# Patient Record
Sex: Male | Born: 1970 | Race: White | Hispanic: No | Marital: Married | Smoking: Former smoker
Health system: Southern US, Community
[De-identification: ages and names within clinical notes are randomized; demographics above are authoritative.]

## PROBLEM LIST (undated history)

## (undated) ENCOUNTER — Ambulatory Visit: Admission: EM | Payer: 59 | Source: Home / Self Care

## (undated) DIAGNOSIS — M1990 Unspecified osteoarthritis, unspecified site: Secondary | ICD-10-CM

## (undated) DIAGNOSIS — J45909 Unspecified asthma, uncomplicated: Secondary | ICD-10-CM

## (undated) DIAGNOSIS — F419 Anxiety disorder, unspecified: Secondary | ICD-10-CM

## (undated) DIAGNOSIS — Z87442 Personal history of urinary calculi: Secondary | ICD-10-CM

## (undated) DIAGNOSIS — I10 Essential (primary) hypertension: Secondary | ICD-10-CM

## (undated) DIAGNOSIS — T7840XA Allergy, unspecified, initial encounter: Secondary | ICD-10-CM

## (undated) DIAGNOSIS — R7989 Other specified abnormal findings of blood chemistry: Secondary | ICD-10-CM

## (undated) DIAGNOSIS — T4145XA Adverse effect of unspecified anesthetic, initial encounter: Secondary | ICD-10-CM

## (undated) DIAGNOSIS — Z8489 Family history of other specified conditions: Secondary | ICD-10-CM

## (undated) DIAGNOSIS — G43909 Migraine, unspecified, not intractable, without status migrainosus: Secondary | ICD-10-CM

## (undated) DIAGNOSIS — K219 Gastro-esophageal reflux disease without esophagitis: Secondary | ICD-10-CM

## (undated) DIAGNOSIS — T8859XA Other complications of anesthesia, initial encounter: Secondary | ICD-10-CM

## (undated) DIAGNOSIS — B019 Varicella without complication: Secondary | ICD-10-CM

## (undated) DIAGNOSIS — E785 Hyperlipidemia, unspecified: Secondary | ICD-10-CM

## (undated) DIAGNOSIS — U071 COVID-19: Secondary | ICD-10-CM

## (undated) DIAGNOSIS — E119 Type 2 diabetes mellitus without complications: Secondary | ICD-10-CM

## (undated) HISTORY — DX: Allergy, unspecified, initial encounter: T78.40XA

## (undated) HISTORY — DX: Type 2 diabetes mellitus without complications: E11.9

## (undated) HISTORY — DX: Unspecified asthma, uncomplicated: J45.909

## (undated) HISTORY — DX: Gastro-esophageal reflux disease without esophagitis: K21.9

## (undated) HISTORY — DX: Varicella without complication: B01.9

## (undated) HISTORY — DX: Unspecified osteoarthritis, unspecified site: M19.90

## (undated) HISTORY — DX: Hyperlipidemia, unspecified: E78.5

## (undated) HISTORY — DX: Migraine, unspecified, not intractable, without status migrainosus: G43.909

## (undated) HISTORY — DX: Anxiety disorder, unspecified: F41.9

## (undated) HISTORY — DX: COVID-19: U07.1

---

## 2001-11-09 HISTORY — PX: OSTECTOMY METATARSAL: SUR970

## 2004-09-21 ENCOUNTER — Emergency Department: Payer: Self-pay | Admitting: Emergency Medicine

## 2004-11-09 ENCOUNTER — Emergency Department: Payer: Self-pay | Admitting: Emergency Medicine

## 2005-04-26 ENCOUNTER — Emergency Department: Payer: Self-pay | Admitting: Emergency Medicine

## 2006-05-27 ENCOUNTER — Emergency Department: Payer: Self-pay | Admitting: Emergency Medicine

## 2006-06-14 ENCOUNTER — Other Ambulatory Visit: Payer: Self-pay

## 2006-06-14 ENCOUNTER — Emergency Department: Payer: Self-pay | Admitting: General Practice

## 2006-09-28 ENCOUNTER — Emergency Department: Payer: Self-pay | Admitting: Internal Medicine

## 2008-03-19 ENCOUNTER — Other Ambulatory Visit: Payer: Self-pay

## 2008-03-19 ENCOUNTER — Observation Stay: Payer: Self-pay | Admitting: Internal Medicine

## 2009-05-16 ENCOUNTER — Emergency Department: Payer: Self-pay | Admitting: Emergency Medicine

## 2009-11-05 ENCOUNTER — Emergency Department: Payer: Self-pay | Admitting: Emergency Medicine

## 2009-12-04 ENCOUNTER — Emergency Department: Payer: Self-pay | Admitting: Emergency Medicine

## 2009-12-11 ENCOUNTER — Emergency Department: Payer: Self-pay | Admitting: Emergency Medicine

## 2010-07-22 ENCOUNTER — Inpatient Hospital Stay (HOSPITAL_COMMUNITY): Admission: EM | Admit: 2010-07-22 | Discharge: 2010-07-23 | Payer: Self-pay | Admitting: Emergency Medicine

## 2010-07-22 ENCOUNTER — Ambulatory Visit: Payer: Self-pay | Admitting: Cardiology

## 2010-07-23 ENCOUNTER — Encounter (INDEPENDENT_AMBULATORY_CARE_PROVIDER_SITE_OTHER): Payer: Self-pay | Admitting: Internal Medicine

## 2011-01-22 LAB — DIFFERENTIAL
Basophils Absolute: 0 10*3/uL (ref 0.0–0.1)
Basophils Relative: 1 % (ref 0–1)
Eosinophils Relative: 2 % (ref 0–5)
Monocytes Absolute: 0.7 10*3/uL (ref 0.1–1.0)
Monocytes Relative: 9 % (ref 3–12)
Neutro Abs: 4.2 10*3/uL (ref 1.7–7.7)

## 2011-01-22 LAB — TROPONIN I
Troponin I: 0.01 ng/mL (ref 0.00–0.06)
Troponin I: <0.01 ng/mL (ref 0.00–0.06)

## 2011-01-22 LAB — CBC
MCH: 30.5 pg (ref 26.0–34.0)
MCHC: 33.8 g/dL (ref 30.0–36.0)
MCV: 89.3 fL (ref 78.0–100.0)
Platelets: 158 10*3/uL (ref 150–400)
Platelets: 165 10*3/uL (ref 150–400)
RBC: 4.43 MIL/uL (ref 4.22–5.81)
RBC: 4.76 MIL/uL (ref 4.22–5.81)
RDW: 12.4 % (ref 11.5–15.5)
WBC: 7.9 10*3/uL (ref 4.0–10.5)

## 2011-01-22 LAB — COMPREHENSIVE METABOLIC PANEL
ALT: 15 U/L (ref 0–53)
AST: 17 U/L (ref 0–37)
Albumin: 3.6 g/dL (ref 3.5–5.2)
Alkaline Phosphatase: 53 U/L (ref 39–117)
CO2: 25 mEq/L (ref 19–32)
Calcium: 8.7 mg/dL (ref 8.4–10.5)
Chloride: 109 mEq/L (ref 96–112)
Creatinine, Ser: 1.19 mg/dL (ref 0.4–1.5)
GFR calc Af Amer: >60 mL/min (ref >60)
GFR calc non Af Amer: >60 mL/min (ref >60)
Glucose, Bld: 101 mg/dL — ABNORMAL HIGH (ref 70–99)
Potassium: 4.2 mEq/L (ref 3.5–5.1)
Sodium: 139 mEq/L (ref 135–145)
Total Bilirubin: 0.5 mg/dL (ref 0.3–1.2)
Total Protein: 6.3 g/dL (ref 6.0–8.3)
Total Protein: 7.2 g/dL (ref 6.0–8.3)

## 2011-01-22 LAB — POCT CARDIAC MARKERS
CKMB, poc: <1 ng/mL — ABNORMAL LOW (ref 1.0–8.0)
CKMB, poc: <1 ng/mL — ABNORMAL LOW (ref 1.0–8.0)
Troponin i, poc: <0.05 ng/mL (ref 0.00–0.09)
Troponin i, poc: <0.05 ng/mL (ref 0.00–0.09)

## 2011-01-22 LAB — RAPID URINE DRUG SCREEN, HOSP PERFORMED
Cocaine: NOT DETECTED
Tetrahydrocannabinol: NOT DETECTED

## 2011-01-22 LAB — CK TOTAL AND CKMB (NOT AT ARMC)
CK, MB: 0.6 ng/mL (ref 0.3–4.0)
Relative Index: INVALID (ref 0.0–2.5)
Relative Index: INVALID (ref 0.0–2.5)
Total CK: 76 U/L (ref 7–232)
Total CK: 86 U/L (ref 7–232)
Total CK: 88 U/L (ref 7–232)

## 2011-01-22 LAB — ETHANOL: Alcohol, Ethyl (B): <5 mg/dL (ref 0–10)

## 2011-01-22 LAB — TSH: TSH: 1.398 u[IU]/mL (ref 0.350–4.500)

## 2011-01-22 LAB — MAGNESIUM: Magnesium: 2 mg/dL (ref 1.5–2.5)

## 2013-12-31 LAB — BASIC METABOLIC PANEL
Anion Gap: 6 — ABNORMAL LOW (ref 7–16)
BUN: 15 mg/dL (ref 7–18)
CALCIUM: 8.3 mg/dL — AB (ref 8.5–10.1)
CHLORIDE: 107 mmol/L (ref 98–107)
Co2: 25 mmol/L (ref 21–32)
Creatinine: 1.65 mg/dL — ABNORMAL HIGH (ref 0.60–1.30)
EGFR (African American): 59 — ABNORMAL LOW
EGFR (Non-African Amer.): 51 — ABNORMAL LOW
GLUCOSE: 124 mg/dL — AB (ref 65–99)
OSMOLALITY: 278 (ref 275–301)
Potassium: 3.7 mmol/L (ref 3.5–5.1)
SODIUM: 138 mmol/L (ref 136–145)

## 2013-12-31 LAB — CBC
HCT: 41.3 % (ref 40.0–52.0)
HGB: 13.6 g/dL (ref 13.0–18.0)
MCH: 29.9 pg (ref 26.0–34.0)
MCHC: 32.9 g/dL (ref 32.0–36.0)
MCV: 91 fL (ref 80–100)
PLATELETS: 158 10*3/uL (ref 150–440)
RBC: 4.54 10*6/uL (ref 4.40–5.90)
RDW: 13.1 % (ref 11.5–14.5)
WBC: 9.7 10*3/uL (ref 3.8–10.6)

## 2013-12-31 LAB — TROPONIN I: Troponin-I: <0.02 ng/mL

## 2013-12-31 LAB — CK TOTAL AND CKMB (NOT AT ARMC)
CK, TOTAL: 223 U/L
CK-MB: 0.8 ng/mL (ref 0.5–3.6)

## 2013-12-31 LAB — PRO B NATRIURETIC PEPTIDE: B-Type Natriuretic Peptide: 35 pg/mL (ref 0–125)

## 2014-01-01 ENCOUNTER — Observation Stay: Payer: Self-pay | Admitting: Internal Medicine

## 2014-01-01 DIAGNOSIS — R079 Chest pain, unspecified: Secondary | ICD-10-CM

## 2014-01-01 DIAGNOSIS — I10 Essential (primary) hypertension: Secondary | ICD-10-CM

## 2014-01-01 DIAGNOSIS — I517 Cardiomegaly: Secondary | ICD-10-CM

## 2014-01-01 DIAGNOSIS — R55 Syncope and collapse: Secondary | ICD-10-CM

## 2014-01-01 LAB — URINALYSIS, COMPLETE
BILIRUBIN, UR: NEGATIVE
BLOOD: NEGATIVE
Bacteria: NONE SEEN
Glucose,UR: 50 mg/dL (ref 0–75)
KETONE: NEGATIVE
Leukocyte Esterase: NEGATIVE
Nitrite: NEGATIVE
Ph: 5 (ref 4.5–8.0)
Protein: NEGATIVE
RBC,UR: <1 /HPF (ref 0–5)
Squamous Epithelial: <1
WBC UR: 1 /HPF (ref 0–5)

## 2014-01-01 LAB — RAPID INFLUENZA A&B ANTIGENS (ARMC ONLY)

## 2014-01-01 LAB — CK TOTAL AND CKMB (NOT AT ARMC)
CK, Total: 144 U/L
CK-MB: <0.5 ng/mL — ABNORMAL LOW (ref 0.5–3.6)

## 2014-01-01 LAB — TROPONIN I

## 2014-01-01 LAB — CK-MB: CK-MB: <0.5 ng/mL — ABNORMAL LOW (ref 0.5–3.6)

## 2014-01-01 LAB — CREATININE, SERUM
Creatinine: 1.35 mg/dL — ABNORMAL HIGH (ref 0.60–1.30)
EGFR (African American): >60

## 2014-01-02 LAB — CBC WITH DIFFERENTIAL/PLATELET
BASOS ABS: 0 10*3/uL (ref 0.0–0.1)
Basophil %: 0.5 %
EOS ABS: 0 10*3/uL (ref 0.0–0.7)
Eosinophil %: 0.7 %
HCT: 39.8 % — ABNORMAL LOW (ref 40.0–52.0)
HGB: 13.5 g/dL (ref 13.0–18.0)
Lymphocyte #: 2.6 10*3/uL (ref 1.0–3.6)
Lymphocyte %: 40.3 %
MCH: 30.7 pg (ref 26.0–34.0)
MCHC: 33.8 g/dL (ref 32.0–36.0)
MCV: 91 fL (ref 80–100)
Monocyte #: 0.6 x10 3/mm (ref 0.2–1.0)
Monocyte %: 8.7 %
NEUTROS ABS: 3.3 10*3/uL (ref 1.4–6.5)
NEUTROS PCT: 49.8 %
Platelet: 87 10*3/uL — ABNORMAL LOW (ref 150–440)
RBC: 4.38 10*6/uL — AB (ref 4.40–5.90)
RDW: 13.3 % (ref 11.5–14.5)
WBC: 6.6 10*3/uL (ref 3.8–10.6)

## 2014-01-02 LAB — LIPID PANEL
Cholesterol: 119 mg/dL (ref 0–200)
HDL Cholesterol: 25 mg/dL — ABNORMAL LOW (ref 40–60)
Ldl Cholesterol, Calc: 47 mg/dL (ref 0–100)
Triglycerides: 234 mg/dL — ABNORMAL HIGH (ref 0–200)
VLDL CHOLESTEROL, CALC: 47 mg/dL — AB (ref 5–40)

## 2014-01-02 LAB — BASIC METABOLIC PANEL
Anion Gap: 5 — ABNORMAL LOW (ref 7–16)
BUN: 13 mg/dL (ref 7–18)
CHLORIDE: 104 mmol/L (ref 98–107)
CO2: 29 mmol/L (ref 21–32)
Calcium, Total: 8.1 mg/dL — ABNORMAL LOW (ref 8.5–10.1)
Creatinine: 1.37 mg/dL — ABNORMAL HIGH (ref 0.60–1.30)
EGFR (African American): >60
Glucose: 112 mg/dL — ABNORMAL HIGH (ref 65–99)
Osmolality: 277 (ref 275–301)
POTASSIUM: 4.5 mmol/L (ref 3.5–5.1)
SODIUM: 138 mmol/L (ref 136–145)

## 2014-10-31 DIAGNOSIS — I160 Hypertensive urgency: Secondary | ICD-10-CM | POA: Insufficient documentation

## 2014-12-09 ENCOUNTER — Emergency Department (HOSPITAL_COMMUNITY): Payer: Self-pay

## 2014-12-09 ENCOUNTER — Emergency Department (HOSPITAL_COMMUNITY)
Admission: EM | Admit: 2014-12-09 | Discharge: 2014-12-09 | Disposition: A | Discharge status: Home / Self Care | Payer: Self-pay | Attending: Emergency Medicine | Admitting: Emergency Medicine

## 2014-12-09 ENCOUNTER — Encounter (HOSPITAL_COMMUNITY): Payer: Self-pay | Admitting: Emergency Medicine

## 2014-12-09 DIAGNOSIS — Y9289 Other specified places as the place of occurrence of the external cause: Secondary | ICD-10-CM | POA: Insufficient documentation

## 2014-12-09 DIAGNOSIS — Y9389 Activity, other specified: Secondary | ICD-10-CM | POA: Insufficient documentation

## 2014-12-09 DIAGNOSIS — I10 Essential (primary) hypertension: Secondary | ICD-10-CM | POA: Insufficient documentation

## 2014-12-09 DIAGNOSIS — Z8639 Personal history of other endocrine, nutritional and metabolic disease: Secondary | ICD-10-CM | POA: Insufficient documentation

## 2014-12-09 DIAGNOSIS — Z79899 Other long term (current) drug therapy: Secondary | ICD-10-CM | POA: Insufficient documentation

## 2014-12-09 DIAGNOSIS — S93402A Sprain of unspecified ligament of left ankle, initial encounter: Secondary | ICD-10-CM | POA: Insufficient documentation

## 2014-12-09 DIAGNOSIS — Z880 Allergy status to penicillin: Secondary | ICD-10-CM | POA: Insufficient documentation

## 2014-12-09 DIAGNOSIS — Y998 Other external cause status: Secondary | ICD-10-CM | POA: Insufficient documentation

## 2014-12-09 DIAGNOSIS — X58XXXA Exposure to other specified factors, initial encounter: Secondary | ICD-10-CM | POA: Insufficient documentation

## 2014-12-09 HISTORY — DX: Other specified abnormal findings of blood chemistry: R79.89

## 2014-12-09 HISTORY — DX: Essential (primary) hypertension: I10

## 2014-12-09 MED ORDER — HYDROCODONE-ACETAMINOPHEN 5-325 MG PO TABS: 1.0000 | ORAL_TABLET | Freq: Four times a day (QID) | ORAL | Status: DC | PRN

## 2014-12-09 MED ORDER — HYDROMORPHONE HCL 1 MG/ML IJ SOLN
1.0000 mg | Freq: Once | INTRAMUSCULAR | Status: AC
  Administered 2014-12-09: 1 mg via INTRAVENOUS
  Filled 2014-12-09: qty 1

## 2014-12-09 NOTE — Progress Notes (Signed)
Orthopedic Tech Progress Note Patient Details:  William Frey October 29, 1971 [id number] Applied fiberglass posterior short leg splint and fiberglass stirrup splint to LLE.  Pulses, sensation, motion intact before and after splinting.  Capillary refill less than 2 seconds before and after splinting.  Fit pt. for crutches and taught use of same. Ortho Devices Type of Ortho Device: Post (short leg) splint, Stirrup splint Ortho Device/Splint Location: LLE Ortho Device/Splint Interventions: Application   Darrol Poke 12/09/2014, 4:37 PM

## 2014-12-09 NOTE — ED Notes (Signed)
Ortho at the bedside.

## 2014-12-09 NOTE — ED Notes (Signed)
Ortho paged. 

## 2014-12-09 NOTE — ED Provider Notes (Signed)
CSN: [id number]     Arrival date & time 12/09/14  1433 History   First MD Initiated Contact with Patient 12/09/14 1436     Chief Complaint  Patient presents with  . Foot Injury  . Ankle Pain     Patient is a 44 y.o. male presenting with foot injury and ankle pain. The history is provided by the patient. No language interpreter was used.  Foot Injury Ankle Pain  Mr. Boule presents for evaluation of left ankle pain. He stepped off of one foot wall and rolled his left ankle. He felt immediate pop and pain. He's had no injury to this ankle previously. He has had prior surgery on his right ankle. He has a history of high blood pressure. He has no other medical problems. He denies any numbness. He denies any syncope or head injury. Symptoms are moderate and constant. There is no radiation of pain.  Past Medical History  Diagnosis Date  . Hypertension   . Low testosterone    History reviewed. No pertinent past surgical history. History reviewed. No pertinent family history. History  Substance Use Topics  . Smoking status: Never Smoker   . Smokeless tobacco: Never Used  . Alcohol Use: No    Review of Systems  All other systems reviewed and are negative.     Allergies  Penicillins  Home Medications   Prior to Admission medications   Medication Sig Start Date End Date Taking? Authorizing Provider  clomiPHENE (CLOMID) 50 MG tablet Take 50 mg by mouth daily. 11/27/14   Historical Provider, MD  metoprolol succinate (TOPROL-XL) 100 MG 24 hr tablet Take 100 mg by mouth daily. 11/26/14   Historical Provider, MD  ranitidine (ZANTAC) 150 MG tablet Take 150 mg by mouth 2 (two) times daily. 10/31/14   Historical Provider, MD   BP 139/88 mmHg  Pulse 93  Temp(Src) 98.1 F (36.7 C) (Oral)  Resp 19  Ht 6\' 1"  (1.854 m)  Wt 285 lb (129.275 kg)  BMI 37.61 kg/m2  SpO2 95% Physical Exam  Constitutional: He is oriented to person, place, and time. He appears well-developed and  well-nourished.  HENT:  Head: Normocephalic and atraumatic.  Cardiovascular: Normal rate and regular rhythm.   No murmur heard. Pulmonary/Chest: Effort normal and breath sounds normal. No respiratory distress.  Abdominal: Soft. There is no tenderness. There is no rebound and no guarding.  Musculoskeletal:  Moderate swelling over the left malleolus and ankle with moderate local tenderness. There is 2+ DP pulses bilaterally. Sensation light touch touch is intact throughout the distal foot. There is no knee or proximal leg tenderness.  Neurological: He is alert and oriented to person, place, and time.  Skin: Skin is warm and dry.  Psychiatric: He has a normal mood and affect. His behavior is normal.  Nursing note and vitals reviewed.   ED Course  Procedures (including critical care time) SPLINT APPLICATION Date/Time: 4:53 PM Authorized by: Quintella Reichert Consent: Verbal consent obtained. Risks and benefits: risks, benefits and alternatives were discussed Consent given by: patient Splint applied by: orthopedic technician Location details: LLE  Splint type: posterior stirrup Post-procedure: The splinted body part was neurovascularly unchanged following the procedure. Patient tolerance: Patient tolerated the procedure well with no immediate complications.     Labs Review Labs Reviewed - No data to display  Imaging Review Dg Ankle Complete Left  12/09/2014   CLINICAL DATA:  Patient wall ankle with pain lateral. Swelling and bruising laterally  EXAM: LEFT ANKLE COMPLETE -  3+ VIEW  COMPARISON:  None.  FINDINGS: Frontal, oblique, and lateral views were obtained. There is soft tissue swelling laterally. On the oblique view, there is a small calcification in the medial malleolar region which is suspicious for an avulsion type injury. No other findings suggesting fracture. Ankle mortise appears intact. No appreciable joint effusion. No erosive change.  IMPRESSION: Suspect small avulsion  arising from medial malleolus. Swelling laterally. Ankle mortise appears grossly intact.   Electronically Signed   By: Lowella Grip M.D.   On: 12/09/2014 15:18     EKG Interpretation None      MDM   Final diagnoses:  Left ankle sprain, initial encounter    Patient here for evaluation of left ankle pain following an injury. There is possibly a fracture of the medial malleolus but this is not completely clear. Patient's tenderness and pain is predominantly on the lateral ankle. Treating with splint, nonweightbearing status to the left lower extremity. Discussed the patient orthopedic follow-up as well as return precautions. Providing prescription for Norco for pain control at home.    Quintella Reichert, MD 12/09/14 [phone]

## 2014-12-09 NOTE — ED Notes (Signed)
Per EMS- pt was unloading a uhaul and rolled his left ankle/foot. Obvious deformity present. 22G PIV placed, 250 MG of Fentanyl given in route.

## 2014-12-09 NOTE — Discharge Instructions (Signed)
You have a severe ankle sprain and possibly a small fracture.  Do not put any weight on your left foot until you are rechecked by orthopedics.     Ankle Sprain An ankle sprain is an injury to the strong, fibrous tissues (ligaments) that hold the bones of your ankle joint together.  CAUSES An ankle sprain is usually caused by a fall or by twisting your ankle. Ankle sprains most commonly occur when you step on the outer edge of your foot, and your ankle turns inward. People who participate in sports are more prone to these types of injuries.  SYMPTOMS   Pain in your ankle. The pain may be present at rest or only when you are trying to stand or walk.  Swelling.  Bruising. Bruising may develop immediately or within 1 to 2 days after your injury.  Difficulty standing or walking, particularly when turning corners or changing directions. DIAGNOSIS  Your caregiver will ask you details about your injury and pCast or Splint Care Casts and splints support injured limbs and keep bones from moving while they heal. It is important to care for your cast or splint at home.  HOME CARE INSTRUCTIONS Keep the cast or splint uncovered during the drying period. It can take 24 to 48 hours to dry if it is made of plaster. A fiberglass cast will dry in less than 1 hour. Do not rest the cast on anything harder than a pillow for the first 24 hours. Do not put weight on your injured limb or apply pressure to the cast until your health care provider gives you permission. Keep the cast or splint dry. Wet casts or splints can lose their shape and may not support the limb as well. A wet cast that has lost its shape can also create harmful pressure on your skin when it dries. Also, wet skin can become infected. Cover the cast or splint with a plastic bag when bathing or when out in the rain or snow. If the cast is on the trunk of the body, take sponge baths until the cast is removed. If your cast does become wet, dry it  with a towel or a blow dryer on the cool setting only. Keep your cast or splint clean. Soiled casts may be wiped with a moistened cloth. Do not place any hard or soft foreign objects under your cast or splint, such as cotton, toilet paper, lotion, or powder. Do not try to scratch the skin under the cast with any object. The object could get stuck inside the cast. Also, scratching could lead to an infection. If itching is a problem, use a blow dryer on a cool setting to relieve discomfort. Do not trim or cut your cast or remove padding from inside of it. Exercise all joints next to the injury that are not immobilized by the cast or splint. For example, if you have a long leg cast, exercise the hip joint and toes. If you have an arm cast or splint, exercise the shoulder, elbow, thumb, and fingers. Elevate your injured arm or leg on 1 or 2 pillows for the first 1 to 3 days to decrease swelling and pain.It is best if you can comfortably elevate your cast so it is higher than your heart. SEEK MEDICAL CARE IF:  Your cast or splint cracks. Your cast or splint is too tight or too loose. You have unbearable itching inside the cast. Your cast becomes wet or develops a soft spot or area. You  have a bad smell coming from inside your cast. You get an object stuck under your cast. Your skin around the cast becomes red or raw. You have new pain or worsening pain after the cast has been applied. SEEK IMMEDIATE MEDICAL CARE IF:  You have fluid leaking through the cast. You are unable to move your fingers or toes. You have discolored (blue or white), cool, painful, or very swollen fingers or toes beyond the cast. You have tingling or numbness around the injured area. You have severe pain or pressure under the cast. You have any difficulty with your breathing or have shortness of breath. You have chest pain. Document Released: 10/23/2000 Document Revised: 08/16/2013 Document Reviewed: 05/04/2013 [facility]  Patient Information 2015 [place], [place]. This information is not intended to replace advice given to you by your health care provider. Make sure you discuss any questions you have with your health care provider. erform a physical exam of your ankle to determine if you have an ankle sprain. During the physical exam, your caregiver will press on and apply pressure to specific areas of your foot and ankle. Your caregiver will try to move your ankle in certain ways. An X-ray exam may be done to be sure a bone was not broken or a ligament did not separate from one of the bones in your ankle (avulsion fracture).  TREATMENT  Certain types of braces can help stabilize your ankle. Your caregiver can make a recommendation for this. Your caregiver may recommend the use of medicine for pain. If your sprain is severe, your caregiver may refer you to a surgeon who helps to restore function to parts of your skeletal system (orthopedist) or a physical therapist. [place] ice to your injury for 1-2 days or as directed by your caregiver. Applying ice helps to reduce inflammation and pain.  Put ice in a plastic bag.  Place a towel between your skin and the bag.  Leave the ice on for 15-20 minutes at a time, every 2 hours while you are awake.  Only take over-the-counter or prescription medicines for pain, discomfort, or fever as directed by your caregiver.  Elevate your injured ankle above the level of your heart as much as possible for 2-3 days.  If your caregiver recommends crutches, use them as instructed. Gradually put weight on the affected ankle. Continue to use crutches or a cane until you can walk without feeling pain in your ankle.  If you have a plaster splint, wear the splint as directed by your caregiver. Do not rest it on anything harder than a pillow for the first 24 hours. Do not put weight on it. Do not get it wet. You may take it off to take a shower or bath.  You may have  been given an elastic bandage to wear around your ankle to provide support. If the elastic bandage is too tight (you have numbness or tingling in your foot or your foot becomes cold and blue), adjust the bandage to make it comfortable.  If you have an air splint, you may blow more air into it or let air out to make it more comfortable. You may take your splint off at night and before taking a shower or bath. Wiggle your toes in the splint several times per day to decrease swelling. SEEK MEDICAL CARE IF:   You have rapidly increasing bruising or swelling.  Your toes feel extremely cold or you lose feeling in your foot.  Your pain is not relieved with medicine. SEEK IMMEDIATE MEDICAL CARE IF:  Your toes are numb or blue.  You have severe pain that is increasing. MAKE SURE YOU:   Understand these instructions.  Will watch your condition.  Will get help right away if you are not doing well or get worse. Document Released: 10/26/2005 Document Revised: 07/20/2012 Document Reviewed: 11/07/2011 [facility] Patient Information 2015 [place], [place]. This information is not intended to replace advice given to you by your health care provider. Make sure you discuss any questions you have with your health care provider.

## 2015-01-21 ENCOUNTER — Emergency Department: Payer: Self-pay | Admitting: Emergency Medicine

## 2015-03-02 NOTE — Discharge Summary (Signed)
PATIENT NAME:  William Frey, William Frey MR#:  [id number] DATE OF BIRTH:  06-24-1971  DATE OF ADMISSION:  01/01/2014 DATE OF DISCHARGE:  01/02/2014  DISCHARGE DIAGNOSES: 1. Syncope, likely vasovagal could be anxiety.  2. Acute kidney injury, likely to be renal, improving.  3. Chest pain, noncardiac, could be from stress. 4. Anxiety requiring outpatient psychiatry follow-up.  5. Possible viral syndrome, improving, but negative flu test.  6. Nausea and vomiting, now resolved.   CONSULTATION: Cardiology, Dr. Kathlyn Sacramento.   PROCEDURES AND RADIOLOGY: Chest x-ray on the 22nd of February showed no acute cardiopulmonary disease.   CT scan of the head without contrast on the 22nd of February showed no acute intracranial abnormality.   CT scan of the chest on the 23rd of February showed no PE.   MRI of the brain with and without contrast on the 23rd of February showed no acute pathology.   Abdominal ultrasound on the 23rd of February showed normal gallbladder. No bile duct dilatation.  Extensive hepatic steatosis and borderline hepatomegaly.   A 2-D echocardiogram on the 23rd of February showed LVEF of  55%  to 60%. Mild concentric left ventricular hypertrophy. Mild aortic valve sclerosis without stenosis.   Major laboratory panel: Urinalysis on admission was negative.   HISTORY AND SHORT HOSPITAL COURSE: The patient is a 44 year old male with above-mentioned medical problems who was admitted for syncope, thought to be due to vasovagal in nature. Please see Dr. Rinaldo Ratel dictated history and physical for further details. Cardiology consultation was obtained with Dr. Fletcher Anon, who recommended 2-D echo and stress test which was both performed and were within normal limits. The patient also underwent Neuro work-up including MRI of the brain which was negative. The patient was feeling much better. A lot of his symptoms were thought to be due to underlying anxiety and psychiatric issue, and he was discharged  home on the 24th of February in stable condition.   PERTINENT PHYSICAL EXAMINATION ON THE DATE OF DISCHARGE:  VITAL SIGNS: On the date of discharge, his vital signs are as follows: Temperature 97.6, heart rate 61 per minute, respirations 18 per minute, blood pressure 130/88 . He was saturating 97% on room air.  CARDIOVASCULAR: S1, S2 normal. No murmurs, rubs or gallop.  LUNGS: Clear to auscultation bilaterally. No wheezing, rales, rhonchi, or crepitation.  ABDOMEN: Soft, benign.  NEUROLOGIC: Nonfocal examination. All other physical examination remained at baseline.   DISCHARGE MEDICATIONS: Metoprolol 50 mg p.o. daily.   DISCHARGE DIET: Low sodium.   DISCHARGE ACTIVITY: As tolerated.   DISCHARGE INSTRUCTIONS AND FOLLOW-UP: The patient was instructed to follow up with his primary care physician, Dr. Brynda Greathouse, in 1 to 2 weeks. He will need follow-up with psychiatry, Dr. Nicolasa Ducking in 2  to 4 weeks.   TOTAL TIME DISCHARGING THIS PATIENT: 55 minutes.  ____________________________ Lucina Mellow. Manuella Ghazi, MD vss:sg D: 01/08/2014 11:58:00 ET T: 01/09/2014 07:16:24 ET JOB#: [id number]  cc: Chelly Dombeck S. Manuella Ghazi, MD, <Dictator> Mikeal Hawthorne. Brynda Greathouse, MD Steva Colder. Nicolasa Ducking, MD Mertie Clause. Fletcher Anon, MD  Remer Macho MD ELECTRONICALLY SIGNED 01/10/2014 21:24

## 2015-03-02 NOTE — Consult Note (Signed)
Brief Consult Note: Diagnosis: syncope likely vasovagal, atypical chest pain.   Patient was seen by consultant.   Consult note dictated.   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 He reports having an echo and a Holter monitor with Dr. Doy Hutching within the last year. Please request these records.  Chest pain is atypical. Will get a treadmil stress test today. If negative, ok to discharge home.  Electronic Signatures: Kathlyn Sacramento (MD)  (Signed 23-Feb-15 09:48)  Authored: Brief Consult Note   Last Updated: 23-Feb-15 09:48 by Kathlyn Sacramento (MD)

## 2015-03-02 NOTE — Consult Note (Signed)
PATIENT NAME:  William Frey, William Frey MR#:  [id number] DATE OF BIRTH:  1971-09-23  DATE OF CONSULTATION:  01/01/2014  REFERRING PHYSICIAN:  Dr. Margaretmary Eddy. CONSULTING PHYSICIAN:  Skyeler Smola A. Fletcher Anon, MD  PRIMARY CARE PHYSICIAN:  Dr. Brynda Greathouse.  REASON FOR CONSULTATION: Syncope and chest pain.   HISTORY OF PRESENT ILLNESS: This is a 44 year old Caucasian male with past medical history of hypertension and anxiety. He was in church yesterday. He was standing and all of a sudden he had a loss of consciousness for a few minutes. He was complaining of a headache at that time. There was no incontinence, confusion or tongue biting. No seizure activities were noted. He reports having similar episodes in the past and was evaluated by Dr. Doy Hutching. I have not seen him in the outpatient setting. He reports having an echocardiogram and a Holter monitor done within the last year and was told that everything was fine. He ran out of metoprolol a few days prior to yesterday before the event. He complains of intermittent episodes of substernal chest tightness, mostly at rest and occasionally with physical activities. He did not have palpitations during the syncopal episode. He does feel extremely anxious before he passes out. Also, the episode was associated with nausea. He vomited. Overnight telemetry shows no arrhythmia. Cardiac enzymes are negative.   PAST MEDICAL HISTORY: Include hypertension, anxiety and hypogonadism.   ALLERGIES: PENICILLIN.   SOCIAL HISTORY: Negative for smoking, alcohol or recreational drug use.   FAMILY HISTORY: Remarkable for hypertension. There is no family history of premature coronary artery disease.   HOME MEDICATIONS: He is supposed to be on metoprolol for hypertension, as well as Clomid for low testosterone.   REVIEW OF SYSTEMS: A ten-point review of systems was performed. It is negative other than what is mentioned in the HPI.   PHYSICAL EXAMINATION: GENERAL: The patient appears to be in his  stated age, in no acute distress.  VITAL SIGNS: Temperature 98.8. Pulse is 102. Respiratory rate is 18. Blood pressure is 138/84  and oxygen saturation is 95% on room air.  HEENT: Normocephalic, atraumatic.  NECK: No JVD or carotid bruits.  RESPIRATORY: Normal respiratory effort with no use of accessory muscles. Auscultation reveals normal breath sounds.  CARDIOVASCULAR: Normal PMI. Normal S1 and S2 with no gallops or murmurs.  ABDOMEN: Benign, nontender and nondistended.  EXTREMITIES: No clubbing, cyanosis or edema.  SKIN: Warm and dry with no rash.   LABORATORY AND DIAGNOSTIC DATA: Cardiac enzymes are negative. Creatinine was mildly elevated at 1.65. Hemoglobin was normal. ECG showed normal sinus rhythm with no significant ST or T wave changes.   IMPRESSION: 1.  Syncope, likely vasovagal.  2.  Atypical chest pain.  3.  Hypertension.   RECOMMENDATIONS: The patient's syncopal episode is similar to his previous vasovagal syncope based on the history. So far, there are no signs of arrhythmia. Baseline ECG is normal and cardiac enzymes are negative. He reports having previous workup with Dr. Doy Hutching, and I recommend obtaining these records. Previous LV systolic function is known to be normal on previous admission in 2009. At that time, he had a nuclear stress test done also, which was unremarkable. Chest pain is overall atypical, but he does reports occasional exertional component. Thus, I recommend proceeding with a treadmill stress test today. If this comes back negative, the patient can be discharged home from a cardiac standpoint. Treatment of hypertension should be resumed.   ____________________________ Mertie Clause. Fletcher Anon, MD maa:[id number] D: 01/01/2014 09:54:04 ET T: 01/01/2014 10:47:50  ET JOB#: [id number]  cc: Nyna Chilton A. Fletcher Anon, MD, <Dictator> [name], MD Mikeal Hawthorne Brynda Greathouse, MD Rogue Jury Ferne Reus MD ELECTRONICALLY SIGNED 01/18/2014 10:06
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
## 2015-05-21 ENCOUNTER — Other Ambulatory Visit: Payer: Self-pay | Admitting: Obstetrics and Gynecology

## 2015-09-18 ENCOUNTER — Encounter: Payer: Self-pay | Admitting: Family Medicine

## 2015-09-18 ENCOUNTER — Ambulatory Visit (INDEPENDENT_AMBULATORY_CARE_PROVIDER_SITE_OTHER): Payer: 59 | Admitting: Family Medicine

## 2015-09-18 VITALS — BP 128/90 | HR 70 | Temp 98.3°F | Ht 73.5 in | Wt 281.0 lb

## 2015-09-18 DIAGNOSIS — E669 Obesity, unspecified: Secondary | ICD-10-CM | POA: Diagnosis not present

## 2015-09-18 DIAGNOSIS — R5383 Other fatigue: Secondary | ICD-10-CM | POA: Diagnosis not present

## 2015-09-18 DIAGNOSIS — M7918 Myalgia, other site: Secondary | ICD-10-CM

## 2015-09-18 DIAGNOSIS — Z0001 Encounter for general adult medical examination with abnormal findings: Secondary | ICD-10-CM | POA: Insufficient documentation

## 2015-09-18 DIAGNOSIS — Z0000 Encounter for general adult medical examination without abnormal findings: Secondary | ICD-10-CM

## 2015-09-18 DIAGNOSIS — R7989 Other specified abnormal findings of blood chemistry: Secondary | ICD-10-CM

## 2015-09-18 DIAGNOSIS — R030 Elevated blood-pressure reading, without diagnosis of hypertension: Secondary | ICD-10-CM | POA: Diagnosis not present

## 2015-09-18 DIAGNOSIS — Z125 Encounter for screening for malignant neoplasm of prostate: Secondary | ICD-10-CM

## 2015-09-18 DIAGNOSIS — M791 Myalgia: Secondary | ICD-10-CM

## 2015-09-18 DIAGNOSIS — Z13220 Encounter for screening for lipoid disorders: Secondary | ICD-10-CM

## 2015-09-18 LAB — COMPREHENSIVE METABOLIC PANEL
ALT: 19 U/L (ref 0–53)
AST: 18 U/L (ref 0–37)
Albumin: 4 g/dL (ref 3.5–5.2)
Alkaline Phosphatase: 86 U/L (ref 39–117)
BILIRUBIN TOTAL: 0.6 mg/dL (ref 0.2–1.2)
BUN: 13 mg/dL (ref 6–23)
CHLORIDE: 101 meq/L (ref 96–112)
CO2: 23 meq/L (ref 19–32)
Calcium: 9.3 mg/dL (ref 8.4–10.5)
Creatinine, Ser: 0.98 mg/dL (ref 0.40–1.50)
GFR: 88.61 mL/min (ref >60.00)
GLUCOSE: 158 mg/dL — AB (ref 70–99)
Potassium: 4.1 mEq/L (ref 3.5–5.1)
Sodium: 137 mEq/L (ref 135–145)
Total Protein: 7.1 g/dL (ref 6.0–8.3)

## 2015-09-18 LAB — CBC
HEMATOCRIT: 42.5 % (ref 39.0–52.0)
Hemoglobin: 14.2 g/dL (ref 13.0–17.0)
MCHC: 33.4 g/dL (ref 30.0–36.0)
MCV: 88.5 fl (ref 78.0–100.0)
Platelets: 221 10*3/uL (ref 150.0–400.0)
RBC: 4.81 Mil/uL (ref 4.22–5.81)
RDW: 13.1 % (ref 11.5–15.5)
WBC: 8.3 10*3/uL (ref 4.0–10.5)

## 2015-09-18 LAB — LIPID PANEL
CHOL/HDL RATIO: 5
Cholesterol: 179 mg/dL (ref 0–200)
HDL: 32.7 mg/dL — AB (ref >39.00)
NONHDL: 146.03
Triglycerides: 275 mg/dL — ABNORMAL HIGH (ref 0.0–149.0)
VLDL: 55 mg/dL — AB (ref 0.0–40.0)

## 2015-09-18 LAB — PSA: PSA: 0.54 ng/mL (ref 0.10–4.00)

## 2015-09-18 LAB — LDL CHOLESTEROL, DIRECT: Direct LDL: 102 mg/dL

## 2015-09-18 LAB — HEMOGLOBIN A1C: HEMOGLOBIN A1C: 8.7 % — AB (ref 4.6–6.5)

## 2015-09-18 MED ORDER — CYCLOBENZAPRINE HCL 10 MG PO TABS: 10.0000 mg | ORAL_TABLET | Freq: Three times a day (TID) | ORAL | Status: DC | PRN

## 2015-09-18 MED ORDER — DICLOFENAC SODIUM 75 MG PO TBEC: 75.0000 mg | DELAYED_RELEASE_TABLET | Freq: Two times a day (BID) | ORAL | Status: DC

## 2015-09-18 NOTE — Progress Notes (Signed)
Pre visit review using our clinic review tool, if applicable. No additional management support is needed unless otherwise documented below in the visit note. 

## 2015-09-18 NOTE — Assessment & Plan Note (Signed)
Screening labs today: CBC, CMP, lipids, A1c. Testosterone and PSA obtained related to possible low T/fatigue. Declined flu. Tetanus up-to-date.
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
## 2015-09-18 NOTE — Assessment & Plan Note (Signed)
Vague MSK pain. Treating with PRN Flexeril and Diclofenac.

## 2015-09-18 NOTE — Patient Instructions (Signed)
It was nice to see you today.  We will call with your lab results. Based on results we may need to send you to urology. The urologist that I recommend are Dr. Jacqlyn Larsen [facility] [place]).  Use the Flexeril and diclofenac as needed for pain/discomfort.  Follow up in 6 months to 1 year.  Take care  Dr. Lacinda Axon

## 2015-09-18 NOTE — Assessment & Plan Note (Signed)
Patient has a history of low testosterone. Obtaining labs including testosterone today. I discussed with the patient that if his testosterone is low that he could consider treatment. I also discussed the fact that treatment with testosterone has potential adverse effects - increased risk of prostate cancer, cardiovascular disease, polycythemia. Will await labs.

## 2015-09-19 ENCOUNTER — Telehealth: Payer: Self-pay | Admitting: Family Medicine

## 2015-09-19 ENCOUNTER — Other Ambulatory Visit: Payer: Self-pay

## 2015-09-19 ENCOUNTER — Encounter: Payer: Self-pay | Admitting: Family Medicine

## 2015-09-19 LAB — TESTOSTERONE, FREE, TOTAL, SHBG
SEX HORMONE BINDING: 11 nmol/L (ref 10–50)
TESTOSTERONE FREE: 40.8 pg/mL — AB (ref 47.0–244.0)
TESTOSTERONE: 135 ng/dL — AB (ref 300–890)
Testosterone-% Free: 3 % — ABNORMAL HIGH (ref 1.6–2.9)

## 2015-09-19 MED ORDER — CYCLOBENZAPRINE HCL 10 MG PO TABS: 10.0000 mg | ORAL_TABLET | Freq: Three times a day (TID) | ORAL | Status: DC | PRN

## 2015-09-19 MED ORDER — DICLOFENAC SODIUM 75 MG PO TBEC: 75.0000 mg | DELAYED_RELEASE_TABLET | Freq: Two times a day (BID) | ORAL | Status: DC

## 2015-09-19 NOTE — Telephone Encounter (Signed)
Sent to [facility]

## 2015-09-19 NOTE — Telephone Encounter (Signed)
Pt medications for cyclobenzaprine (FLEXERIL) 10 MG tablet and diclofenac (VOLTAREN) 75 MG EC tablet needs to be resent. Thank you!

## 2015-09-19 NOTE — Telephone Encounter (Signed)
Pt called to give the pharmacy name as to where he wants his medication to go to [place] [facility] the hospital. Not [facility]. Thank You!

## 2015-09-23 ENCOUNTER — Encounter: Payer: Self-pay | Admitting: Family Medicine

## 2015-09-23 ENCOUNTER — Other Ambulatory Visit: Payer: Self-pay | Admitting: Family Medicine

## 2015-09-23 ENCOUNTER — Ambulatory Visit (INDEPENDENT_AMBULATORY_CARE_PROVIDER_SITE_OTHER): Payer: 59 | Admitting: Family Medicine

## 2015-09-23 ENCOUNTER — Telehealth: Payer: Self-pay | Admitting: Family Medicine

## 2015-09-23 ENCOUNTER — Other Ambulatory Visit: Payer: Self-pay

## 2015-09-23 VITALS — BP 128/90 | HR 75 | Temp 97.9°F | Ht 73.5 in | Wt 280.5 lb

## 2015-09-23 DIAGNOSIS — E291 Testicular hypofunction: Secondary | ICD-10-CM

## 2015-09-23 DIAGNOSIS — E669 Obesity, unspecified: Secondary | ICD-10-CM | POA: Diagnosis not present

## 2015-09-23 DIAGNOSIS — E119 Type 2 diabetes mellitus without complications: Secondary | ICD-10-CM | POA: Diagnosis not present

## 2015-09-23 DIAGNOSIS — E1165 Type 2 diabetes mellitus with hyperglycemia: Secondary | ICD-10-CM | POA: Insufficient documentation

## 2015-09-23 DIAGNOSIS — R7989 Other specified abnormal findings of blood chemistry: Secondary | ICD-10-CM | POA: Insufficient documentation

## 2015-09-23 MED ORDER — ONETOUCH ULTRASOFT LANCETS MISC: Status: AC

## 2015-09-23 MED ORDER — METFORMIN HCL 500 MG PO TABS: 500.0000 mg | ORAL_TABLET | Freq: Two times a day (BID) | ORAL | Status: DC

## 2015-09-23 MED ORDER — GLUCOSE BLOOD VI STRP: ORAL_STRIP | Status: DC

## 2015-09-23 NOTE — Assessment & Plan Note (Signed)
Referring to nutrition and starting metformin. Patient given a glucometer and strips today. Patient to check blood sugar fasting daily. Follow-up in 3 months. Wife states that she will give him an ophthalmology appointment for diabetic eye exam.

## 2015-09-23 NOTE — Progress Notes (Signed)
Pre visit review using our clinic review tool, if applicable. No additional management support is needed unless otherwise documented below in the visit note. 

## 2015-09-23 NOTE — Progress Notes (Signed)
Subjective:  Patient ID: William Frey, male    DOB: October 12, 1971  Age: 44 y.o. MRN: [id number]  CC: Discuss lab results and treatment options  HPI:  44 year old male who recently established with me on 11/9 presents to discuss recent lab results which revealed A1C of 8.7 (consistent with diabetes).  Newly diagnosed Type 2 diabetes  Uncontrolled. Patient had A1c of 8.7.  He presents today to discuss treatment options.  He states that he will not do injectables/insulin.  Fatigue/decreased libido, low testosterone  Due to his symptomatology I recently obtained labs which revealed a total testosterone of 135.  He would like to discuss treatment/referral.  Obesity  Stable.  Has recently made some dietary changes given  New diagnosis of diabetes. He has cut out sweet tea and bread.  Social Hx   Social History   Social History  . Marital Status: Married    Spouse Name: N/A  . Number of Children: N/A  . Years of Education: N/A   Social History Main Topics  . Smoking status: Former Smoker    Quit date: 09/17/2013  . Smokeless tobacco: Never Used  . Alcohol Use: Yes     Comment: former  . Drug Use: No  . Sexual Activity: Yes   Other Topics Concern  . None   Social History Narrative   Review of Systems  Constitutional: Positive for fatigue.  Genitourinary:       Decreased libido.   Objective:  BP 128/90 mmHg  Pulse 75  Temp(Src) 97.9 F (36.6 C) (Oral)  Ht 6' 1.5" (1.867 m)  Wt 280 lb 8 oz (127.234 kg)  BMI 36.50 kg/m2  SpO2 95%  BP/Weight 09/23/2015 [id number]  Systolic BP 0000000 0000000  Diastolic BP 90 90  Wt. (Lbs) 280.5 281  BMI 36.5 36.57   Physical Exam  Constitutional: He is oriented to person, place, and time. He appears well-developed. No distress.  HENT:  Head: Normocephalic and atraumatic.  Pulmonary/Chest: Effort normal.  Neurological: He is alert and oriented to person, place, and time.  Psychiatric: He has a normal mood and affect.    Vitals reviewed.  Lab Results  Component Value Date   WBC 8.3 09/18/2015   HGB 14.2 09/18/2015   HCT 42.5 09/18/2015   PLT 221.0 09/18/2015   GLUCOSE 158* 09/18/2015   CHOL 179 09/18/2015   TRIG 275.0* 09/18/2015   HDL 32.70* 09/18/2015   LDLDIRECT 102.0 09/18/2015   ALT 19 09/18/2015   AST 18 09/18/2015   NA 137 09/18/2015   K 4.1 09/18/2015   CL 101 09/18/2015   CREATININE 0.98 09/18/2015   BUN 13 09/18/2015   CO2 23 09/18/2015   PSA 0.54 09/18/2015   HGBA1C 8.7* 09/18/2015    Assessment & Plan:   Problem List Items Addressed This Visit    Type 2 diabetes mellitus ([place]) - Primary    Referring to nutrition and starting metformin. Patient given a glucometer and strips today. Patient to check blood sugar fasting daily. Follow-up in 3 months. Wife states that she will give him an ophthalmology appointment for diabetic eye exam.      Relevant Medications   metFORMIN (GLUCOPHAGE) 500 MG tablet   Other Relevant Orders   Amb Referral to Nutrition and Diabetic E   Obesity (BMI 30-39.9)    Patient has made some dietary changes and will see nutrition.      Relevant Medications   metFORMIN (GLUCOPHAGE) 500 MG tablet   Low testosterone    After  lengthy discussion about treating options and risk/benefit, I'm referring him to urology for further workup and treatment.      Relevant Orders   Ambulatory referral to Urology      Meds ordered this encounter  Medications  . metFORMIN (GLUCOPHAGE) 500 MG tablet    Sig: Take 1 tablet (500 mg total) by mouth 2 (two) times daily.    Dispense:  180 tablet    Refill:  3   30 minutes were spent face-to-face with the patient during this encounter and over half of that time was spent on counseling and coordination of care.   Follow-up: 3 months.   Thersa Salt, DO

## 2015-09-23 NOTE — Patient Instructions (Addendum)
Take your blood sugar once daily (fasting in the morning). Goal is less than 100.  Take the metformin twice daily. After 1 week increase to a total of 1500 mg daily. After an additional week increase to 2000 mg total.  I have sent in a referral to Nutrition and Urology.  We will be in touch.  Follow up in 3 months.  Take care  Dr. Lacinda Axon

## 2015-09-23 NOTE — Assessment & Plan Note (Signed)
Patient has made some dietary changes and will see nutrition.

## 2015-09-23 NOTE — Telephone Encounter (Signed)
Called needign a rx for his test strips because he was going to run out?

## 2015-09-23 NOTE — Assessment & Plan Note (Signed)
After lengthy discussion about treating options and risk/benefit, I'm referring him to urology for further workup and treatment.

## 2015-09-30 ENCOUNTER — Encounter: Payer: Self-pay | Admitting: *Deleted

## 2015-09-30 ENCOUNTER — Encounter: Payer: Self-pay | Admitting: Family Medicine

## 2015-09-30 ENCOUNTER — Encounter: Payer: 59 | Attending: Family Medicine | Admitting: *Deleted

## 2015-09-30 VITALS — BP 112/80 | Ht 73.0 in | Wt 275.0 lb

## 2015-09-30 DIAGNOSIS — E119 Type 2 diabetes mellitus without complications: Secondary | ICD-10-CM | POA: Insufficient documentation

## 2015-09-30 NOTE — Progress Notes (Signed)
Diabetes Self-Management Education  Visit Type: First/Initial  Appt. Start Time: 1115 Appt. End Time: 1230  09/30/2015  [name], identified by name and date of birth, is a 44 y.o. male with a diagnosis of Diabetes: Type 2.   ASSESSMENT  Blood pressure 112/80, height 6\' 1"  (1.854 m), weight 275 lb (124.739 kg). Body mass index is 36.29 kg/(m^2).      Diabetes Self-Management Education - 09/30/15 1148    Visit Information   Visit Type First/Initial   Initial Visit   Diabetes Type Type 2   Are you currently following a meal plan? Yes   What type of meal plan do you follow? no carbs or sugar; drinking only water - no longer drinking sweet tea   Are you taking your medications as prescribed? Yes   Date Diagnosed 3 weeks ago   Health Coping   How would you rate your overall health? Fair   Psychosocial Assessment   Patient Belief/Attitude about Diabetes Motivated to manage diabetes   Self-care barriers None   Self-management support Doctor's office;Family   Patient Concerns Nutrition/Meal planning;Medication;Monitoring;Healthy Lifestyle;Glycemic Control;Weight Control   Special Needs None   Preferred Learning Style Auditory   Learning Readiness Change in progress   How often do you need to have someone help you when you read instructions, pamphlets, or other written materials from your doctor or pharmacy? 1 - Never   What is the last grade level you completed in school? 9th   Complications   Last HgB A1C per patient/outside source 8.7 %  09/19/15   How often do you check your blood sugar? 1-2 times/day   Fasting Blood glucose range (mg/dL) 70-129;130-179  FBG's range from 123-152 mg/dL   Have you had a dilated eye exam in the past 12 months? No   Have you had a dental exam in the past 12 months? No   Are you checking your feet? No   Dietary Intake   Breakfast egg, sausage   Snack (morning) peanuts, sunflower seeds   Lunch grilled chicken, green beans   Snack  (afternoon) nuts or sunflower seeds   Dinner grilled chicken or pork chop; mac-n-cheese   Snack (evening) nuts or sunflower seeds   Beverage(s) water   Exercise   Exercise Type ADL's   Patient Education   Previous Diabetes Education No   Disease state  Definition of diabetes, type 1 and 2, and the diagnosis of diabetes;Factors that contribute to the development of diabetes   Nutrition management  Role of diet in the treatment of diabetes and the relationship between the three main macronutrients and blood glucose level   Physical activity and exercise  Role of exercise on diabetes management, blood pressure control and cardiac health.   Medications Reviewed patients medication for diabetes, action, purpose, timing of dose and side effects.   Monitoring Purpose and frequency of SMBG.;Identified appropriate SMBG and/or A1C goals.   Chronic complications Relationship between chronic complications and blood glucose control   Psychosocial adjustment Identified and addressed patients feelings and concerns about diabetes   Individualized Goals (developed by patient)   Reducing Risk Improve blood sugars Decrease medications Prevent diabetes complications Lose weight Lead a healthier lifestyle Become more fit   Outcomes   Expected Outcomes Demonstrated interest in learning. Expect positive outcomes      Individualized Plan for Diabetes Self-Management Training:   Learning Objective:  Patient will have a greater understanding of diabetes self-management. Patient education plan is to attend individual and/or group  sessions per assessed needs and concerns.   Plan:   Patient Instructions  Check blood sugars 1 x day before breakfast or 2 hrs after supper every day Exercise: Begin walking for  15  minutes 3 days a week and gradually increase to 150 minutes/week Eat 3 meals day,  2  snacks a day Space meals 4-6 hours apart Make a dentist and eye doctor appointments Bring blood sugar records  to the next class   Expected Outcomes:  Demonstrated interest in learning. Expect positive outcomes  Education material provided:  General Meal Planning Guidelines  If problems or questions, patient to contact team via:   Johny Drilling, [place], [place], CDE [phone]  Future DSME appointment:  October 10, 2015 for Class 1

## 2015-09-30 NOTE — Patient Instructions (Addendum)
Check blood sugars 1 x day before breakfast or 2 hrs after supper every day Exercise: Begin walking for  15  minutes 3 days a week and gradually increase to 150 minutes/week Eat 3 meals day,  2  snacks a day Space meals 4-6 hours apart Make a dentist and eye doctor appointment Bring blood sugar records to the next class

## 2015-10-10 ENCOUNTER — Encounter: Payer: 59 | Attending: Family Medicine | Admitting: Dietician

## 2015-10-10 VITALS — Ht 73.0 in | Wt 270.7 lb

## 2015-10-10 DIAGNOSIS — E119 Type 2 diabetes mellitus without complications: Secondary | ICD-10-CM | POA: Diagnosis not present

## 2015-10-10 NOTE — Progress Notes (Signed)

## 2015-10-17 ENCOUNTER — Encounter: Payer: Self-pay | Admitting: *Deleted

## 2015-10-17 ENCOUNTER — Encounter: Payer: 59 | Admitting: *Deleted

## 2015-10-17 VITALS — BP 140/94 | Wt 270.6 lb

## 2015-10-17 DIAGNOSIS — E119 Type 2 diabetes mellitus without complications: Secondary | ICD-10-CM

## 2015-10-17 NOTE — Progress Notes (Signed)
Appt. Start Time: 0900 Appt. End Time: 1200  Pt came to class reporting chest tightness - rates as 5 on pain scale. He reports that he had this before when he started taking Metformin 1000 mg 2 x day which he increased per MD order this week. He reports it feels like he has smoked several packs of cigarettes. Encouraged him to go to ER for evaluation but he declined. Pt reports he has had heart tests and he doesn't have blockages and is "not having a heart attack." He denies shortness of breath, dizziness, sweats, or radiation to left arm. BP was 140/94.  Class 2 Diabetes Overview - define DM; state own type of DM; identify functions of pancreas and insulin; define insulin deficiency vs insulin resistance  Psychosocial - identify DM as a source of stress; state the effects of stress on BG control; verbalize appropriate stress management techniques; identify personal stress issues   Nutritional Management - describe effects of food on blood glucose; identify sources of carbohydrate, protein and fat; verbalize the importance of balance meals in controlling blood glucose; identify meals as well balanced or not; estimate servings of carbohydrate from menus; use food labels to identify servings size, content of carbohydrate, fiber, protein, fat, saturated fat and sodium; recognize food sources of fat, saturated fat, trans fat, sodium and verbalize goals for intake; describe healthful appropriate food choices when dining out   Exercise - describe the effects of exercise on blood glucose and importance of regular exercise in controlling diabetes; state a plan for personal exercise; verbalize contraindications for exercise  Medications - state name, dose, timing of currently prescribed medications; describe types of medications available for diabetes  Self-Monitoring - state importance of HBGM and demo procedure accurately; use HBGM results to effectively manage diabetes; identify importance of regular HbA1C  testing and goals for results  Acute Complications/Sick Day Guidelines - recognize hyperglycemia and hypoglycemia with causes and effects; identify blood glucose results as high, low or in control; list steps in treating and preventing high and low blood glucose; state appropriate measure to manage blood glucose when ill (need for meds, HBGM plan, when to call physician, need for fluids)  Chronic Complications/Foot, Skin, Eye Dental Care - identify possible long-term complications of diabetes (retinopathy, neuropathy, nephropathy, cardiovascular disease, infections); explain steps in prevention and treatment of chronic complications; state importance of daily self-foot exams; describe how to examine feet and what to look for; explain appropriate eye and dental care  Lifestyle Changes/Goals & Health/Community Resources - state benefits of making appropriate lifestyle changes; identify habits that need to change (meals, tobacco, alcohol); identify strategies to reduce risk factors for personal health; set goals for proper diabetes care; state need for and frequency of healthcare follow-up; describe appropriate community resources for good health (ADA, web sites, apps)   Pregnancy/Sexual Health - define gestational diabetes; state importance of good blood glucose control and birth control prior to pregnancy; state importance of good blood glucose control in preventing sexual problems (impotence, vaginal dryness, infections, loss of desire); state relationship of blood glucose control and pregnancy outcome; describe risk of maternal and fetal complications  Teaching Materials Used: Class 2 Slide Packet A1C Pamphlet Foot Care Literature Menu Ideas Goals for Class 2

## 2015-10-24 ENCOUNTER — Encounter: Payer: 59 | Admitting: Dietician

## 2015-10-24 VITALS — BP 122/84 | Ht 73.0 in | Wt 268.7 lb

## 2015-10-24 DIAGNOSIS — E119 Type 2 diabetes mellitus without complications: Secondary | ICD-10-CM | POA: Diagnosis not present

## 2015-10-24 NOTE — Progress Notes (Signed)

## 2015-10-28 ENCOUNTER — Encounter: Payer: Self-pay | Admitting: *Deleted

## 2015-10-31 DIAGNOSIS — R6882 Decreased libido: Secondary | ICD-10-CM | POA: Insufficient documentation

## 2015-11-03 DIAGNOSIS — N529 Male erectile dysfunction, unspecified: Secondary | ICD-10-CM | POA: Insufficient documentation

## 2015-11-03 DIAGNOSIS — Z87442 Personal history of urinary calculi: Secondary | ICD-10-CM | POA: Insufficient documentation

## 2015-11-19 ENCOUNTER — Encounter (HOSPITAL_COMMUNITY): Payer: Self-pay | Admitting: Emergency Medicine

## 2015-11-20 ENCOUNTER — Other Ambulatory Visit: Payer: Self-pay

## 2015-11-20 NOTE — Patient Outreach (Signed)
[name] Care Management  11/20/2015  William Frey [birth year] [id number]   Patient did not show for today's scheduled 1:30pm visit.    [name], RN, BA, [place], [place] Direct Dial:  [phone]  Fax:  [phone] E-mail: [email or web address] [address] [place], [place]  [address]

## 2015-11-28 ENCOUNTER — Telehealth: Payer: Self-pay

## 2015-11-28 DIAGNOSIS — E291 Testicular hypofunction: Secondary | ICD-10-CM | POA: Diagnosis not present

## 2015-11-28 NOTE — Telephone Encounter (Signed)
I am happy to provide medical information, but it is ultimately up to the company to decide whether he is approved.

## 2015-11-28 NOTE — Telephone Encounter (Signed)
Wife called and stated that life insurance policy won't cover her husband based of Diabetic Dx and BMI., She wants a letter  in a letter of appeal that has objective findings, office notes and lab results. Pt.'s wife would like to speak with you [phone]. Please advise./tvw

## 2015-11-29 NOTE — Telephone Encounter (Signed)
Told patient we would talk about it at his next visit.

## 2015-12-10 ENCOUNTER — Other Ambulatory Visit: Payer: Self-pay

## 2015-12-10 VITALS — BP 102/82 | Ht 73.0 in | Wt 262.2 lb

## 2015-12-10 DIAGNOSIS — E119 Type 2 diabetes mellitus without complications: Secondary | ICD-10-CM

## 2015-12-10 NOTE — Patient Outreach (Signed)
William Frey) Care Management  [place]  12/10/2015   William Frey 1971-11-04 [id number]  Subjective: Patient in for his first [facility] diabetes support visit.  He reports checking blood sugars randomly over the past 4 weeks- he states "they were always the same so I didn't see the need in checking them".  He reports eating three meals each day but is not exercising and sits most of the evening. He has no complaints of pain.  Objective: Blood sugars107-132mg /dl in the morning (fasting) and 123-190mg /dl 2 hours after supper.  He denies hypo or hyperglycemia.   Current Medications:  Current Outpatient Prescriptions  Medication Sig Dispense Refill  . cyclobenzaprine (FLEXERIL) 10 MG tablet Take 1 tablet (10 mg total) by mouth 3 (three) times daily as needed for muscle spasms. 60 tablet 1  . diclofenac (VOLTAREN) 75 MG EC tablet Take 1 tablet (75 mg total) by mouth 2 (two) times daily. (Patient taking differently: Take 75 mg by mouth 2 (two) times daily as needed. ) 60 tablet 1  . glucose blood (ONETOUCH VERIO) test strip Use as instructed 100 each 12  . Lancets (ONETOUCH ULTRASOFT) lancets Use as instructed 100 each 12  . metFORMIN (GLUCOPHAGE) 500 MG tablet Take 1 tablet (500 mg total) by mouth 2 (two) times daily. (Patient taking differently: Take 1,000 mg by mouth 2 (two) times daily with a meal. 2 tabs in am, 2 tab in pm) 180 tablet 3  . clomiPHENE (CLOMID) 50 MG tablet Take 50 mg by mouth daily. Reported on 12/10/2015  5  . HYDROcodone-acetaminophen (NORCO/VICODIN) 5-325 MG per tablet Take 1 tablet by mouth every 6 (six) hours as needed for moderate pain or severe pain. (Patient not taking: Reported on 12/10/2015) 20 tablet 0  . metoprolol succinate (TOPROL-XL) 100 MG 24 hr tablet Take 100 mg by mouth daily. Reported on 12/10/2015  1  . ranitidine (ZANTAC) 150 MG tablet Take 150 mg by mouth daily as needed for heartburn. Reported on 12/10/2015  1   No  current facility-administered medications for this visit.    Functional Status:  In your present state of health, do you have any difficulty performing the following activities: 12/10/2015  Hearing? N  Vision? Y  Difficulty concentrating or making decisions? Y  Walking or climbing stairs? N  Dressing or bathing? N  Doing errands, shopping? N    Fall/Depression Screening: PHQ 2/9 Scores 12/10/2015 09/30/2015  PHQ - 2 Score 0 0    Assessment: William Frey is not eating according to ADA guidelines- we discussed the need to balance protein/carbs, fats and low carb vegetables to maintain control of blood sugars and prevent long term complications.  Sausage and egg biscuits without the biscuit gives him a high fat choice and no carbohydrates, cereal or grits with milk gives him carbs without protein resulting in post prandial elevations and blood sugars that may not be maintained until the next meal. We also discussed that high fat meals will result in long term complications to his blood vessels.  Reviewed with the patient, that sugar free does not mean carb free and that produces labelled sugar free are still carbohydrates and need to be "counted" as a carbohydrate.  Discussed the importance of exercise and the cardiac benefits to exercising.   Plan:  [facility] CM Care Plan Problem One        Most Recent Value   Care Plan Problem One  Elevated A1C >7%   Role Documenting the Problem  One  Care Management Coordinator   Care Plan for Problem One  Active   [facility] Long Term Goal (31-90 days)  Patient will decrease his A1C less than previously attained 8.7% when he sees his doctor at his next visit.    [facility] Long Term Goal Start Date  12/10/15   Interventions for Problem One Long Term Goal  Patient will exercise 2x/week for 15 minutes     I will follow up with William Frey in 1 month by phone following his MD visit and after obtaining his A1C  [name], RN, [place], [place], CDE Diabetes Coordinator Inpatient Diabetes  Program  [phone] (Team Pager) [phone] ([place]) 12/10/2015 11:03 AM

## 2015-12-11 DIAGNOSIS — H524 Presbyopia: Secondary | ICD-10-CM | POA: Diagnosis not present

## 2015-12-11 DIAGNOSIS — E119 Type 2 diabetes mellitus without complications: Secondary | ICD-10-CM | POA: Diagnosis not present

## 2015-12-11 DIAGNOSIS — H5213 Myopia, bilateral: Secondary | ICD-10-CM | POA: Diagnosis not present

## 2015-12-11 DIAGNOSIS — H52223 Regular astigmatism, bilateral: Secondary | ICD-10-CM | POA: Diagnosis not present

## 2015-12-11 LAB — HM DIABETES EYE EXAM

## 2015-12-23 ENCOUNTER — Encounter: Payer: Self-pay | Admitting: Family Medicine

## 2015-12-23 ENCOUNTER — Ambulatory Visit (INDEPENDENT_AMBULATORY_CARE_PROVIDER_SITE_OTHER): Payer: 59 | Admitting: Family Medicine

## 2015-12-23 VITALS — BP 126/62 | HR 69 | Temp 97.6°F | Ht 73.5 in | Wt 267.2 lb

## 2015-12-23 DIAGNOSIS — E119 Type 2 diabetes mellitus without complications: Secondary | ICD-10-CM

## 2015-12-23 DIAGNOSIS — E785 Hyperlipidemia, unspecified: Secondary | ICD-10-CM | POA: Insufficient documentation

## 2015-12-23 DIAGNOSIS — E291 Testicular hypofunction: Secondary | ICD-10-CM | POA: Diagnosis not present

## 2015-12-23 DIAGNOSIS — R7989 Other specified abnormal findings of blood chemistry: Secondary | ICD-10-CM

## 2015-12-23 LAB — LIPID PANEL
CHOLESTEROL: 182 mg/dL (ref 0–200)
HDL: 28.5 mg/dL — ABNORMAL LOW (ref >39.00)
NonHDL: 153.17
Total CHOL/HDL Ratio: 6
Triglycerides: 316 mg/dL — ABNORMAL HIGH (ref 0.0–149.0)
VLDL: 63.2 mg/dL — ABNORMAL HIGH (ref 0.0–40.0)

## 2015-12-23 LAB — LDL CHOLESTEROL, DIRECT: LDL DIRECT: 99 mg/dL

## 2015-12-23 LAB — MICROALBUMIN / CREATININE URINE RATIO
Creatinine,U: 178.8 mg/dL
MICROALB/CREAT RATIO: 1.5 mg/g (ref 0.0–30.0)
Microalb, Ur: 2.6 mg/dL — ABNORMAL HIGH (ref 0.0–1.9)

## 2015-12-23 LAB — HEMOGLOBIN A1C: HEMOGLOBIN A1C: 6.8 % — AB (ref 4.6–6.5)

## 2015-12-23 MED ORDER — METFORMIN HCL 500 MG PO TABS: 1000.0000 mg | ORAL_TABLET | Freq: Two times a day (BID) | ORAL | Status: DC

## 2015-12-23 NOTE — Progress Notes (Signed)
Subjective:  Patient ID: William Frey, male    DOB: 1971-09-27  Age: 45 y.o. MRN: [id number]  CC: DM follow up  HPI:  45 -year-old male presents for follow-up regarding DM-2, Low Testosterone, HLD.  DM-2  Patient has made lifestyle changes (watching diet, drinking only water).  Additionally, he is compliant with metformin 1000 mg twice a day.  He states that his fasting sugars have been in the low 100s. He has had times where they have been elevated but he attributes this to dietary indiscretion.  No other complaints at this time.  HLD  Stable.   On no medications currently.  Low T  Has been seen by Urology.  He is doing well on Androgel currently.   Social Hx   Social History   Social History  . Marital Status: Married    Spouse Name: N/A  . Number of Children: N/A  . Years of Education: N/A   Social History Main Topics  . Smoking status: Former Smoker -- 2.00 packs/day for 25 years    Types: Cigarettes    Quit date: 09/17/2013  . Smokeless tobacco: Never Used  . Alcohol Use: No     Comment: former  . Drug Use: No  . Sexual Activity: Yes   Other Topics Concern  . None   Social History Narrative   ** Merged History Encounter **       Review of Systems  Constitutional: Positive for fatigue.  Endocrine: Negative.    Objective:  BP 126/62 mmHg  Pulse 69  Temp(Src) 97.6 F (36.4 C) (Oral)  Ht 6' 1.5" (1.867 m)  Wt 267 lb 4 oz (121.224 kg)  BMI 34.78 kg/m2  SpO2 95%  BP/Weight 12/23/2015 12/10/2015 [id number]  Systolic BP 123XX123 [id number] [id number]  Diastolic BP 62 82 84  Wt. (Lbs) 267.25 262.2 268.7  BMI 34.78 34.6 35.46   Physical Exam  Constitutional: He is oriented to person, place, and time. He appears well-developed. No distress.  Cardiovascular: Normal rate and regular rhythm.   Pulmonary/Chest: Effort normal and breath sounds normal.  Neurological: He is alert and oriented to person, place, and time.  Psychiatric: He has a normal mood and  affect.  Vitals reviewed.  Lab Results  Component Value Date   WBC 8.3 09/18/2015   HGB 14.2 09/18/2015   HCT 42.5 09/18/2015   PLT 221.0 09/18/2015   GLUCOSE 158* 09/18/2015   CHOL 179 09/18/2015   TRIG 275.0* 09/18/2015   HDL 32.70* 09/18/2015   LDLDIRECT 102.0 09/18/2015   LDLCALC 47 01/02/2014   ALT 19 09/18/2015   AST 18 09/18/2015   NA 137 09/18/2015   K 4.1 09/18/2015   CL 101 09/18/2015   CREATININE 0.98 09/18/2015   BUN 13 09/18/2015   CO2 23 09/18/2015   TSH 1.398 07/22/2010   PSA 0.54 09/18/2015   HGBA1C 8.7* 09/18/2015   Assessment & Plan:   Problem List Items Addressed This Visit    Type 2 diabetes mellitus ([place]) - Primary    Establish problem. Improving per patient report. A1C and microalbumin today. Advised to continue metformin.       Relevant Medications   metFORMIN (GLUCOPHAGE) 500 MG tablet   Other Relevant Orders   Urine Microalbumin w/creat. ratio   HgB A1c   Low testosterone    Stable. Doing well on Androgel.      Hyperlipidemia    Stable. Rechecking today.       Relevant Medications   sildenafil (REVATIO)  20 MG tablet   Other Relevant Orders   Lipid Profile     Meds ordered this encounter  Medications  . glucose blood test strip    Sig: 1 each by Other route as needed for other (true matrix glucose test strips. E11.9). Use as instructed  . metFORMIN (GLUCOPHAGE) 500 MG tablet    Sig: Take 2 tablets (1,000 mg total) by mouth 2 (two) times daily with a meal. 2 tabs in am, 2 tab in pm    Dispense:  360 tablet    Refill:  3   Follow-up: Pending lab work.   [place]

## 2015-12-23 NOTE — Assessment & Plan Note (Signed)
Establish problem. Improving per patient report. A1C and microalbumin today. Advised to continue metformin.

## 2015-12-23 NOTE — Assessment & Plan Note (Signed)
Stable. Doing well on Androgel.

## 2015-12-23 NOTE — Patient Instructions (Signed)
It was nice to see you today.  Follow will be scheduled pending your blood work.  Take care  Dr. Lacinda Axon

## 2015-12-23 NOTE — Progress Notes (Signed)
Pre visit review using our clinic review tool, if applicable. No additional management support is needed unless otherwise documented below in the visit note. 

## 2015-12-23 NOTE — Assessment & Plan Note (Signed)
Stable. Rechecking today.

## 2015-12-25 ENCOUNTER — Encounter: Payer: Self-pay | Admitting: Family Medicine

## 2016-01-01 DIAGNOSIS — E291 Testicular hypofunction: Secondary | ICD-10-CM | POA: Diagnosis not present

## 2016-01-07 ENCOUNTER — Other Ambulatory Visit: Payer: Self-pay

## 2016-01-07 NOTE — Patient Outreach (Signed)
[facility]) Care Management  01/07/2016  William Frey 1971/08/08 [id number]  Spoke to patient by phone today- he saw his MD on 12/23/15 and had his dilated eye exam on 12/11/15. [name] tells me he is not checking blood sugars and has started on "something for my low testosterone".  He reports that his testosterone levels have improved.    He is not exercising telling me that he's "too tired after working all day".  I have encouraged him to try and get a walk for 15 minutes 2x/week and aim to check his blood sugars 3 x/week.   Reports he had a biscuit with egg and bacon for breakfast , continues to only drink water and continues to eat 3 meals per day.  I will follow up with him in one month.   [name], RN, BA, [place], [place] Direct Dial:  [phone]  Fax:  [phone] E-mail: [email or web address] [address] [place], [place]  [address]

## 2016-03-02 ENCOUNTER — Ambulatory Visit: Payer: Self-pay

## 2016-03-03 ENCOUNTER — Other Ambulatory Visit: Payer: Self-pay

## 2016-03-03 NOTE — Patient Outreach (Signed)
[place] [facility]) Care Management  03/03/2016  William Frey 03-30-1971 [id number]  Spoke to patient today regarding missed appointment yesterday. He knew he had an appointment but didn't know who it was with.  I have given him 3 options to reschedule- he needs to e-mail me what date he'd like to return.  [name], RN, BA, MHA, CDE Diabetes Coordinator Inpatient Diabetes Program  [phone] (Team Pager) [phone] ([place]) 03/03/2016 2:21 PM

## 2016-03-11 ENCOUNTER — Other Ambulatory Visit: Payer: Self-pay

## 2016-03-11 DIAGNOSIS — E119 Type 2 diabetes mellitus without complications: Secondary | ICD-10-CM

## 2016-03-11 NOTE — Patient Outreach (Signed)
[facility]) Care Management  [place]  03/11/2016   William Frey 07-03-1971 [id number]  Subjective: Met with the patient today regarding his diabetes.  He reports the following; "I don't have time to sit down and eat three meals or eat 3 snacks" "I bought a bag of 400 Tootsie Rolls but I haven't eaten them all" "Who has time to fit in exercise - I'm busy all the time" "I don't exercise but I'm always moving" "I remember my Metformin over 50 % of the time but lots of time they get washed because I forget them in my pocket" "I don't check my sugar very often because when I do it's always good"  Objective:  Filed Vitals:   03/11/16 0958  BP: 112/84  Height: 1.854 m ('6\' 1"' )  Weight: 256 lb 9.6 oz (116.393 kg)    Encounter Medications:  Outpatient Encounter Prescriptions as of 03/11/2016  Medication Sig Note  . cyclobenzaprine (FLEXERIL) 10 MG tablet Take 1 tablet (10 mg total) by mouth 3 (three) times daily as needed for muscle spasms.   . diclofenac (VOLTAREN) 75 MG EC tablet Take 1 tablet (75 mg total) by mouth 2 (two) times daily. (Patient taking differently: Take 75 mg by mouth 2 (two) times daily as needed. )   . glucose blood test strip 1 each by Other route as needed for other (true matrix glucose test strips. E11.9). Use as instructed   . ibuprofen (ADVIL,MOTRIN) 800 MG tablet Take 800 mg by mouth every 8 (eight) hours as needed.   . Lancets (ONETOUCH ULTRASOFT) lancets Use as instructed   . metFORMIN (GLUCOPHAGE) 500 MG tablet Take 2 tablets (1,000 mg total) by mouth 2 (two) times daily with a meal. 2 tabs in am, 2 tab in pm   . sildenafil (REVATIO) 20 MG tablet  12/23/2015: Received from: [facility]  . Testosterone (ANDROGEL PUMP) 20.25 MG/ACT (1.62%) GEL  12/23/2015: Received from: [facility]   No facility-administered encounter medications on file as of 03/11/2016.    Functional Status:  In your present state of health, do you have  any difficulty performing the following activities: 12/10/2015  Hearing? N  Vision? Y  Difficulty concentrating or making decisions? Y  Walking or climbing stairs? N  Dressing or bathing? N  Doing errands, shopping? N    Fall/Depression Screening: PHQ 2/9 Scores 03/11/2016 12/10/2015 09/30/2015  PHQ - 2 Score 0 0 0    Assessment: Patient is not following the American Diabetes Association guidelines as instructed in classes. He is consistently skipping meals, forgetting medications, not checking sugars or counting carbs. We reviewed food label reading, the importance of checking blood sugars, the need to devise a plan to remember to take his medications and the importance of exercise to manage diabetes for the long term.  Because he is losing weight and because he is not seeing elevated blood sugars on his meter, he is not being diligent about following the recommended guidelines.  During our discussion we reveiwed the goals set at our last visit, but he has been unable to achieve any of them; exercise 2x/week for 15 minutes, check sugar 3-4 times per week, 1 protein / 4 carbs at all meals, 3 meals per day, take Metformin 2 times per day.  Plan: I will see [name] after the MD sees him.  I have asked [name] to schedule an appointment with the MD for August. Please stress the importance of following the recommended ADA  guidelines for long term health- following the guidelines will preserve pancreatic function and decrease insulin resistance.   [name], RN, BA, [place], [place] Direct Dial:  [phone]  Fax:  [phone] E-mail: [email or web address] [address], [place], [place]  [address]

## 2016-05-15 DIAGNOSIS — Z79899 Other long term (current) drug therapy: Secondary | ICD-10-CM | POA: Diagnosis not present

## 2016-05-15 DIAGNOSIS — N401 Enlarged prostate with lower urinary tract symptoms: Secondary | ICD-10-CM | POA: Insufficient documentation

## 2016-05-15 DIAGNOSIS — N529 Male erectile dysfunction, unspecified: Secondary | ICD-10-CM | POA: Diagnosis not present

## 2016-05-15 DIAGNOSIS — E291 Testicular hypofunction: Secondary | ICD-10-CM | POA: Diagnosis not present

## 2016-05-15 DIAGNOSIS — N138 Other obstructive and reflux uropathy: Secondary | ICD-10-CM | POA: Diagnosis not present

## 2016-05-22 ENCOUNTER — Encounter: Payer: Self-pay | Admitting: *Deleted

## 2016-05-28 ENCOUNTER — Ambulatory Visit (INDEPENDENT_AMBULATORY_CARE_PROVIDER_SITE_OTHER): Payer: 59 | Admitting: Family Medicine

## 2016-05-28 ENCOUNTER — Ambulatory Visit (INDEPENDENT_AMBULATORY_CARE_PROVIDER_SITE_OTHER): Payer: 59

## 2016-05-28 ENCOUNTER — Encounter: Payer: Self-pay | Admitting: Family Medicine

## 2016-05-28 VITALS — BP 120/80 | HR 87 | Wt 255.0 lb

## 2016-05-28 DIAGNOSIS — M545 Low back pain, unspecified: Secondary | ICD-10-CM

## 2016-05-28 DIAGNOSIS — M47816 Spondylosis without myelopathy or radiculopathy, lumbar region: Secondary | ICD-10-CM | POA: Diagnosis not present

## 2016-05-28 NOTE — Progress Notes (Signed)
   Subjective:  Patient ID: William Frey, male    DOB: 1971-08-14  Age: 46 y.o. MRN: [id number]  CC: Hip pain  HPI:  45 year old male presents with the above complaint.  Patient reports that he's had "hip pain" for the past 2 weeks. Pain is located in the left low back/upper gluteal region near the belt line. Worse with sitting and driving. Improves with standing and activity. He's been using diclofenac recently. He's had recent improvement of the past few days that she's been on vacation. No reports of radicular symptoms. No reports of groin pain or pain around the greater trochanter. No recent fall, trauma, injury. No other complaints at this time.  Social Hx   Social History   Social History  . Marital Status: Married    Spouse Name: N/A  . Number of Children: N/A  . Years of Education: N/A   Social History Main Topics  . Smoking status: Former Smoker -- 2.00 packs/day for 25 years    Types: Cigarettes    Quit date: 09/17/2013  . Smokeless tobacco: Never Used  . Alcohol Use: No     Comment: former  . Drug Use: No  . Sexual Activity: Yes   Other Topics Concern  . None   Social History Narrative   ** Merged History Encounter **       Review of Systems  Constitutional: Negative.   Musculoskeletal:       Pain back/hip.   Objective:  BP 120/80 mmHg  Pulse 87  Wt 255 lb (115.667 kg)  SpO2 96%  BP/Weight 05/28/2016 03/11/2016 [id number]  Systolic BP 123456 [id number]  Diastolic BP 80 84 62  Wt. (Lbs) 255 256.6 267.25  BMI 33.65 33.86 34.78   Physical Exam  Constitutional: He is oriented to person, place, and time. He appears well-developed. No distress.  Pulmonary/Chest: Effort normal.  Musculoskeletal:  Hip: Left ROM - Good ROM. Greater trochanter without tenderness to palpation. No pain with FABER or FADIR.  Low back - no discrete areas of tenderness.  Neurological: He is alert and oriented to person, place, and time.  Psychiatric: He has a normal mood and  affect.  Vitals reviewed.   Lab Results  Component Value Date   WBC 8.3 09/18/2015   HGB 14.2 09/18/2015   HCT 42.5 09/18/2015   PLT 221.0 09/18/2015   GLUCOSE 158* 09/18/2015   CHOL 182 12/23/2015   TRIG 316.0* 12/23/2015   HDL 28.50* 12/23/2015   LDLDIRECT 99.0 12/23/2015   LDLCALC 47 01/02/2014   ALT 19 09/18/2015   AST 18 09/18/2015   NA 137 09/18/2015   K 4.1 09/18/2015   CL 101 09/18/2015   CREATININE 0.98 09/18/2015   BUN 13 09/18/2015   CO2 23 09/18/2015   TSH 1.398 07/22/2010   PSA 0.54 09/18/2015   HGBA1C 6.8* 12/23/2015   MICROALBUR 2.6* 12/23/2015    Assessment & Plan:   Problem List Items Addressed This Visit    Left-sided low back pain without sciatica - Primary    New acute problem. Likely coming from the back. Xray today. Treating with Diclofenac. Advised consistent use for next 1-2 weeks.      Relevant Orders   DG Lumbar Spine Complete (Completed)     Follow-up: PRN  [place]

## 2016-05-28 NOTE — Patient Instructions (Addendum)
Diclofenac for the next 2 weeks.  We will call with the results.  Follow up later this year or sooner if needed  Take care  Dr. Lacinda Axon

## 2016-05-29 ENCOUNTER — Encounter: Payer: Self-pay | Admitting: *Deleted

## 2016-05-29 DIAGNOSIS — M545 Low back pain, unspecified: Secondary | ICD-10-CM | POA: Insufficient documentation

## 2016-05-29 NOTE — Assessment & Plan Note (Signed)
New acute problem. Likely coming from the back. Xray today. Treating with Diclofenac. Advised consistent use for next 1-2 weeks.

## 2016-06-01 ENCOUNTER — Telehealth: Payer: Self-pay | Admitting: *Deleted

## 2016-06-01 NOTE — Telephone Encounter (Signed)
Patient requested Xray results Pt contact [phone]

## 2016-06-04 ENCOUNTER — Telehealth: Payer: Self-pay | Admitting: *Deleted

## 2016-06-04 NOTE — Telephone Encounter (Signed)
Likely from job/sitting and from degenerative changes in the back.

## 2016-06-04 NOTE — Telephone Encounter (Signed)
Patient requested his Xray resuts from 07/20 Pt contact [phone]

## 2016-06-04 NOTE — Telephone Encounter (Signed)
Spoke with the patient, reviewed the results with him, he wants to know why his back is hurting still, then, please advise, thanks

## 2016-06-05 NOTE — Telephone Encounter (Signed)
I believe is coming from your back. Continue current treatment that we discussed.

## 2016-06-05 NOTE — Telephone Encounter (Signed)
Noted  

## 2016-06-05 NOTE — Telephone Encounter (Signed)
Spoke with patient.  Verbally understood, he wanted to know if you think it may be a pinched nerve, as the pain has progressively gotten worse.  Please advise, thanks

## 2016-06-09 DIAGNOSIS — E291 Testicular hypofunction: Secondary | ICD-10-CM | POA: Diagnosis not present

## 2016-06-09 DIAGNOSIS — N138 Other obstructive and reflux uropathy: Secondary | ICD-10-CM | POA: Diagnosis not present

## 2016-06-09 DIAGNOSIS — Z79899 Other long term (current) drug therapy: Secondary | ICD-10-CM | POA: Diagnosis not present

## 2016-06-09 DIAGNOSIS — N401 Enlarged prostate with lower urinary tract symptoms: Secondary | ICD-10-CM | POA: Diagnosis not present

## 2016-07-08 ENCOUNTER — Other Ambulatory Visit: Payer: Self-pay

## 2016-07-08 DIAGNOSIS — E119 Type 2 diabetes mellitus without complications: Secondary | ICD-10-CM

## 2016-07-08 NOTE — Patient Outreach (Signed)
[facility]) Care Management  [place]  07/08/2016   William Frey 07-10-1971 [id number]  Subjective: Spoke to patient by phone today.  He is not checking blood sugars and is not exercising or taking Metformin as ordered.  He skips breakfast most days and doesn't want to take the Metformin for risk of stomach upset. The first time he'll eat in a day is 2-3pm.  He "don't eat candy, I don't eat sugar unless it's carbs".  He tells me he eats "no rice , fresh fruit, yogurt or vegetables".    He tells me he saw the doctor since I saw him but on reviewing of his records, he did not discuss his diabetes with the MD when he went.  He did not have an A1C completed.   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  He also indicates he has chronic foot pain because "I'm always up on them"   Encounter Medications:  Outpatient Encounter Prescriptions as of 07/08/2016  Medication Sig Note  . cyclobenzaprine (FLEXERIL) 10 MG tablet Take 1 tablet (10 mg total) by mouth 3 (three) times daily as needed for muscle spasms.   Marland Kitchen glucose blood test strip 1 each by Other route as needed for other (true matrix glucose test strips. E11.9). Use as instructed   . Lancets (ONETOUCH ULTRASOFT) lancets Use as instructed   . metFORMIN (GLUCOPHAGE) 500 MG tablet Take 2 tablets (1,000 mg total) by mouth 2 (two) times daily with a meal. 2 tabs in am, 2 tab in pm   . Testosterone (ANDROGEL PUMP) 20.25 MG/ACT ([id number]) GEL 2 application.  12/23/2015: Received from: [facility]  . diclofenac (VOLTAREN) 75 MG EC tablet Take 1 tablet (75 mg total) by mouth 2 (two) times daily. (Patient not taking: Reported on 07/08/2016)   . ibuprofen (ADVIL,MOTRIN) 800 MG tablet Take 800 mg by mouth every 8 (eight) hours as needed.   . sildenafil (REVATIO) 20 MG tablet "caused him to feel like he was having a heart attack" 12/23/2015: Received from: [facility]   No  facility-administered encounter medications on file as of 07/08/2016.     Functional Status:  In your present state of health, do you have any difficulty performing the following activities: 12/10/2015  Hearing? N  Vision? Y  Difficulty concentrating or making decisions? Y  Walking or climbing stairs? N  Dressing or bathing? N  Doing errands, shopping? N  Some recent data might be hidden    Fall/Depression Screening: PHQ 2/9 Scores 07/08/2016 03/11/2016 12/10/2015 09/30/2015  PHQ - 2 Score 1 0 0 0    Assessment: William Frey is not engaged in his care and not following the ADA guidelines.   Plan: He has not been successful in completing the goals he set at his last appointment; get an A1C in August, take Metformin 2x/day.  He last had an A1C in February, 2017 and does not have a scheduled doctor's appointment as he has indicated.   [facility] CM Care Plan Problem Two   Flowsheet Row Most Recent Value  Care Plan Problem Two  Potential for elevated blood sugars  Role Documenting the Problem Two  Care Management Coordinator  Care Plan for Problem Two  Active  Interventions for Problem Two Long Term Goal   Discussed the importance of checking blood sugars, incorporating exercise into his daily schedule, the importance of taking medications as they are ordered.  Importance of eating three meals per day  [facility] Long Term Goal (31-90) days  Patient will maintain A1C less then 6.8%  [facility] Long Term Goal Start Date  07/08/16     Follow up with this patient in 3 months.   [name], RN, BA, [place], [place] Direct Dial:  [phone]  Fax:  [phone] E-mail: [email or web address] [address] [place], [place]  [address]

## 2016-07-29 ENCOUNTER — Other Ambulatory Visit: Payer: Self-pay

## 2016-07-29 NOTE — Patient Outreach (Signed)
[facility]) Care Management  07/29/2016  AMAYA BADERTSCHER 03-Dec-1970 [id number]   I have sent an email to [facility] at [email or web address] that I have scheduled an appointment for him on 09/16/16 for an A1C and visit.   I have asked him to confirm this visit and if he is still interested in attending the program.   [name], RN, [place], [place], [place]:  [phone]  Fax:  [phone] E-mail: [email or web address] [address], [place], [place]  [address]

## 2016-08-18 NOTE — Patient Outreach (Signed)
[facility]) Care Management  08/18/2016  William Frey February 19, 1971 [id number]   Mailed note to [name] to have him confirm appointment which I notified him of on 07/29/16.   [name], RN, BA, [place], [place] Direct Dial:  [phone]  Fax:  [phone] E-mail: [email or web address] [address], [place], [place]  [address]

## 2016-08-24 ENCOUNTER — Telehealth: Payer: Self-pay | Admitting: Family Medicine

## 2016-08-24 ENCOUNTER — Ambulatory Visit: Payer: Self-pay | Admitting: Family Medicine

## 2016-08-24 DIAGNOSIS — Z0289 Encounter for other administrative examinations: Secondary | ICD-10-CM

## 2016-08-24 NOTE — Telephone Encounter (Signed)
Have him rescheduled to tomorrow is pt wishes.

## 2016-08-24 NOTE — Telephone Encounter (Signed)
Pt wife called to make an appt this morning for 11:15, pt missed got stuck on a call. Please advise, thank you!

## 2016-08-24 NOTE — Telephone Encounter (Signed)
I offered the patient an appointment for tomorrow when he called earlier and stated that he would call back.

## 2016-11-08 ENCOUNTER — Emergency Department: Payer: 59

## 2016-11-08 ENCOUNTER — Encounter: Payer: Self-pay | Admitting: Emergency Medicine

## 2016-11-08 ENCOUNTER — Emergency Department
Admission: EM | Admit: 2016-11-08 | Discharge: 2016-11-09 | Disposition: A | Discharge status: Home / Self Care | Payer: 59 | Attending: Emergency Medicine | Admitting: Emergency Medicine

## 2016-11-08 DIAGNOSIS — Z87891 Personal history of nicotine dependence: Secondary | ICD-10-CM | POA: Diagnosis not present

## 2016-11-08 DIAGNOSIS — R079 Chest pain, unspecified: Secondary | ICD-10-CM

## 2016-11-08 DIAGNOSIS — Z7984 Long term (current) use of oral hypoglycemic drugs: Secondary | ICD-10-CM | POA: Insufficient documentation

## 2016-11-08 DIAGNOSIS — E119 Type 2 diabetes mellitus without complications: Secondary | ICD-10-CM | POA: Diagnosis not present

## 2016-11-08 DIAGNOSIS — R55 Syncope and collapse: Secondary | ICD-10-CM | POA: Insufficient documentation

## 2016-11-08 DIAGNOSIS — R0789 Other chest pain: Secondary | ICD-10-CM | POA: Insufficient documentation

## 2016-11-08 DIAGNOSIS — J45909 Unspecified asthma, uncomplicated: Secondary | ICD-10-CM | POA: Diagnosis not present

## 2016-11-08 DIAGNOSIS — R51 Headache: Secondary | ICD-10-CM | POA: Diagnosis not present

## 2016-11-08 DIAGNOSIS — I10 Essential (primary) hypertension: Secondary | ICD-10-CM | POA: Insufficient documentation

## 2016-11-08 DIAGNOSIS — R519 Headache, unspecified: Secondary | ICD-10-CM

## 2016-11-08 DIAGNOSIS — Z79899 Other long term (current) drug therapy: Secondary | ICD-10-CM | POA: Insufficient documentation

## 2016-11-08 DIAGNOSIS — R410 Disorientation, unspecified: Secondary | ICD-10-CM | POA: Diagnosis not present

## 2016-11-08 LAB — CBC
HCT: 44.6 % (ref 40.0–52.0)
Hemoglobin: 15.4 g/dL (ref 13.0–18.0)
MCH: 30.4 pg (ref 26.0–34.0)
MCHC: 34.5 g/dL (ref 32.0–36.0)
MCV: 87.9 fL (ref 80.0–100.0)
PLATELETS: 199 10*3/uL (ref 150–440)
RBC: 5.07 MIL/uL (ref 4.40–5.90)
RDW: 12.9 % (ref 11.5–14.5)
WBC: 7.8 10*3/uL (ref 3.8–10.6)

## 2016-11-08 LAB — BASIC METABOLIC PANEL
Anion gap: 10 (ref 5–15)
BUN: 20 mg/dL (ref 6–20)
CO2: 25 mmol/L (ref 22–32)
CREATININE: 1.13 mg/dL (ref 0.61–1.24)
Calcium: 9.5 mg/dL (ref 8.9–10.3)
Chloride: 101 mmol/L (ref 101–111)
Glucose, Bld: 195 mg/dL — ABNORMAL HIGH (ref 65–99)
Potassium: 4 mmol/L (ref 3.5–5.1)
SODIUM: 136 mmol/L (ref 135–145)

## 2016-11-08 LAB — TROPONIN I

## 2016-11-08 MED ORDER — IOPAMIDOL (ISOVUE-370) INJECTION 76%
75.0000 mL | Freq: Once | INTRAVENOUS | Status: AC | PRN
  Administered 2016-11-08: 75 mL via INTRAVENOUS

## 2016-11-08 MED ORDER — MORPHINE SULFATE (PF) 4 MG/ML IV SOLN
4.0000 mg | Freq: Once | INTRAVENOUS | Status: AC
  Administered 2016-11-08: 4 mg via INTRAVENOUS

## 2016-11-08 MED ORDER — ONDANSETRON HCL 4 MG/2ML IJ SOLN
INTRAMUSCULAR | Status: AC
  Administered 2016-11-08: 4 mg via INTRAVENOUS
  Filled 2016-11-08: qty 2

## 2016-11-08 MED ORDER — MORPHINE SULFATE (PF) 2 MG/ML IV SOLN
2.0000 mg | Freq: Once | INTRAVENOUS | Status: AC
  Administered 2016-11-08: 2 mg via INTRAVENOUS
  Filled 2016-11-08: qty 1

## 2016-11-08 MED ORDER — MORPHINE SULFATE (PF) 4 MG/ML IV SOLN
INTRAVENOUS | Status: AC
  Administered 2016-11-08: 4 mg via INTRAVENOUS
  Filled 2016-11-08: qty 1

## 2016-11-08 MED ORDER — OXYCODONE-ACETAMINOPHEN 5-325 MG PO TABS
ORAL_TABLET | ORAL | Status: AC
  Filled 2016-11-08: qty 1

## 2016-11-08 MED ORDER — DIPHENHYDRAMINE HCL 50 MG/ML IJ SOLN
25.0000 mg | Freq: Once | INTRAMUSCULAR | Status: AC
  Administered 2016-11-08: 25 mg via INTRAVENOUS
  Filled 2016-11-08: qty 1

## 2016-11-08 MED ORDER — ONDANSETRON HCL 4 MG/2ML IJ SOLN
4.0000 mg | Freq: Once | INTRAMUSCULAR | Status: AC
  Administered 2016-11-08: 4 mg via INTRAVENOUS

## 2016-11-08 MED ORDER — OXYCODONE-ACETAMINOPHEN 5-325 MG PO TABS
1.0000 | ORAL_TABLET | Freq: Once | ORAL | Status: AC
Start: 2016-11-09 — End: 2016-11-08
  Administered 2016-11-08: 1 via ORAL

## 2016-11-08 MED ORDER — SODIUM CHLORIDE 0.9 % IV BOLUS (SEPSIS)
1000.0000 mL | Freq: Once | INTRAVENOUS | Status: AC
  Administered 2016-11-08: 1000 mL via INTRAVENOUS

## 2016-11-08 NOTE — ED Notes (Signed)
Patient transported to CT 

## 2016-11-08 NOTE — ED Notes (Signed)
MRI called and able to talk to pt as well as Therapist, sports. MRI tech verbalized they will be on there way and would contact the RN when ready for pt scan.

## 2016-11-08 NOTE — ED Notes (Signed)
Patient transported to MRI 

## 2016-11-08 NOTE — ED Notes (Signed)
Patient denies pain and is resting comfortably.  

## 2016-11-08 NOTE — Discharge Instructions (Signed)
We discussed staying in the hospital overnight versus outpatient cardiology follow-up and you chose to discharge home tonight after reassuring emergency department evaluation.  Return to the emergency department for any worsening headache, confusion, seizure, passing out, chest pain, trouble breathing, nausea, vomiting, or any other symptoms concerning to you.

## 2016-11-08 NOTE — ED Provider Notes (Signed)
[facility] Emergency Department Provider Note ____________________________________________   I have reviewed the triage vital signs and the triage nursing note.  HISTORY  Chief Complaint Chest Pain   Historian Somewhat limited from patient due to altered mental status Wife provides history  HPI William Frey is a 45 y.o. male who wife states had met them at a restaurant and became pale and diaphoretic and decreased responsiveness.  Nearly passed out and leaned to the side, but then did respond to wife stating nothing was wrong.  At that time he started to complain of left sided neck pain down into the left chest and arm.  Wife states has had some complaints of left sided chest pain that last about 1/2 hour over the past few weeks, not necessarily associate with eating, but also not associated with diaphoresis or radiation or near syncope like today.  No reported seizure activity.  Patient states he still has some discomfort at the neck but smile. He states he still can't remember how or why he got here. He states he is a mild global headache.  No recent illnesses. Next the next He has passed out once before, but that was several years ago when he was under quite a bit of stress. States he was previously on blood pressure medication, but after the stressors were decreased, he was able to get off blood pressure medication.He does not take any blood pressure pills.    Past Medical History:  Diagnosis Date  . Allergy   . Anxiety   . Arthritis   . Asthma   . Back pain   . Chicken pox   . Depression   . Diabetes mellitus without complication ([place])   . Frequent headaches   . GERD (gastroesophageal reflux disease)   . Hypertension   . Kidney stones   . Low testosterone   . Migraines     Patient Active Problem List   Diagnosis Date Noted  . Left-sided low back pain without sciatica 05/29/2016  . Hyperlipidemia 12/23/2015  . Type 2 diabetes mellitus  ([place]) 09/23/2015  . Low testosterone 09/23/2015  . Preventative health care 09/18/2015  . Prehypertension 09/18/2015  . Obesity (BMI 30-39.9) 09/18/2015  . Fatigue 09/18/2015    Past Surgical History:  Procedure Laterality Date  . OSTECTOMY METATARSAL Right 2003    Prior to Admission medications   Medication Sig Start Date End Date Taking? Authorizing Provider  cyclobenzaprine (FLEXERIL) 10 MG tablet Take 1 tablet (10 mg total) by mouth 3 (three) times daily as needed for muscle spasms. 09/19/15  Yes Coral Spikes, DO  diclofenac (VOLTAREN) 75 MG EC tablet Take 1 tablet (75 mg total) by mouth 2 (two) times daily. 09/19/15  Yes Coral Spikes, DO  ibuprofen (ADVIL,MOTRIN) 800 MG tablet Take 800 mg by mouth every 8 (eight) hours as needed.   Yes Historical Provider, MD  metFORMIN (GLUCOPHAGE) 500 MG tablet Take 2 tablets (1,000 mg total) by mouth 2 (two) times daily with a meal. 2 tabs in am, 2 tab in pm 12/23/15  Yes Coral Spikes, DO  sildenafil (REVATIO) 20 MG tablet "caused him to feel like he was having a heart attack" 10/31/15  Yes Historical Provider, MD  Testosterone (ANDROGEL PUMP) 20.25 MG/ACT (1.62%) GEL Apply 4 Doses topically. Use 4 pumps (2 on each shoulder) daily. 12/03/15  Yes Historical Provider, MD  glucose blood test strip 1 each by Other route as needed for other (true matrix glucose test strips.  E11.9). Use as instructed    Historical Provider, MD  Lancets [facility] ULTRASOFT) lancets Use as instructed 09/23/15   Coral Spikes, DO    Allergies  Allergen Reactions  . Penicillins Other (See Comments)    Childhood allergy  . Penicillins Hives    Family History  Problem Relation Age of Onset  . Arthritis Mother   . Cancer Mother     breast and bone  . Arthritis Father   . Lymphoma Father   . Heart disease Father   . Stroke Father   . Hypertension Father   . Cancer Brother     lung  . Diabetes Brother   . Diabetes Maternal Aunt   . Diabetes Maternal Aunt      Social History Social History  Substance Use Topics  . Smoking status: Former Smoker    Packs/day: 2.00    Years: 25.00    Types: Cigarettes    Quit date: 09/17/2013  . Smokeless tobacco: Never Used  . Alcohol use No     Comment: former  Mother died of a brain aneurysm.  Review of Systems  Constitutional: Negative for fever. Eyes: Negative for visual changes. ENT: Negative for sore throat. Cardiovascular: Operative for chest pain. Respiratory: Negative for shortness of breath. Gastrointestinal: Negative for abdominal pain, vomiting and diarrhea. Genitourinary: Negative for dysuria. Musculoskeletal: Negative for back pain. Skin: Negative for rash. Neurological: Positive for headache. 10 point Review of Systems otherwise negative ____________________________________________   PHYSICAL EXAM:  VITAL SIGNS: ED Triage Vitals [11/08/16 1427]  Enc Vitals Group     BP      Pulse      Resp      Temp      Temp src      SpO2      Weight 250 lb (113.4 kg)     Height _0  (1.88 m)     Head Circumference      Peak Flow      Pain Score      Pain Loc      Pain Edu?      Excl. in [place]?      Constitutional: Alert and cooperative, but amnesia to what happened. No acute distress. HEENT   Head: Normocephalic and atraumatic.      Eyes: Conjunctivae are normal. PERRL. Normal extraocular movements.      Ears:         Nose: No congestion/rhinnorhea.   Mouth/Throat: Mucous membranes are moist.   Neck: No stridor. Cardiovascular/Chest: Normal rate, regular rhythm.  No murmurs, rubs, or gallops. Respiratory: Normal respiratory effort without tachypnea nor retractions. Breath sounds are clear and equal bilaterally. No wheezes/rales/rhonchi. Gastrointestinal: Soft. No distention, no guarding, no rebound. Nontender.  Genitourinary/rectal:Deferred Musculoskeletal: Nontender with normal range of motion in all extremities. No joint effusions.  No lower extremity tenderness.   No edema. Neurologic:  No slurred speech.  No facial droop.  CN 2-10 intact.  Amnesia of the history.  5/5 strength in 4 extremities.  No sensory deficits.  Coordination intact. Skin:  Skin is warm, dry and intact. No rash noted. Psychiatric: Mood and affect are normal. Speech and behavior are normal. Patient exhibits appropriate insight and judgment.   ____________________________________________  LABS (pertinent positives/negatives)  Labs Reviewed  BASIC METABOLIC PANEL - Abnormal; Notable for the following:       Result Value   Glucose, Bld 195 (*)    All other components within normal limits  CBC  TROPONIN I  TROPONIN I    ____________________________________________    EKG I, Lisa Roca, MD, the attending physician have personally viewed and interpreted all ECGs.  80 bpm. Normal sinus rhythm. Narrow QRS. Normal axis. Normal ST and T-wave ____________________________________________  RADIOLOGY All Xrays were viewed by me. Imaging interpreted by Radiologist.  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  Normal CT angiography. No evidence of atherosclerotic vascular disease. No stenosis or occlusion. No other pathologic vascular process.  MRI brain:  IMPRESSION: No acute intracranial process ; negative MRI head. __________________________________________  PROCEDURES  Procedure(s) performed: None  Critical Care performed: None  ____________________________________________   ED COURSE / ASSESSMENT AND PLAN  Pertinent labs & imaging results that were available during my care of the patient were reviewed by me and considered in my medical decision making (see chart for details).   Mr. Busche was brought in after an episode of altered mental status which seem to be associated with a near syncopal  episode and chest pain/neck pain. Currently he is no longer really having chest discomfort, he does have some neck soreness and mild headache and still amnesia to the event. No focal neurologic deficit on exam, but again with the headache and amnesia, I did discuss with patient and wife to pursue additional head and neck imaging.  EKG is reassuring as is initial blood work and negative troponin.  CT head and CT angio head and neck reassuring.    Given unusual episode, nearly syncope, amnesia, will obtain MRI brain as well.  There was possible avm in cerebellum on CT.  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  Specialist on call Neurologist consult, does not recommend any additional emergency and neurologic evaluation or management or hospitalization.   From the standpoint of the chest discomfort episode, if repeat troponin is negative, patient requests to go home rather than admitted in the hospital overnight. I think this is reasonable plan. I will have him call the cardiologist office on Tuesday for close evaluation and follow-up.  Patient care transferred to Dr. Owens Shark at shift change 1 PM, if troponin negative, patient may be discharged with my prepared discharge instructions.    CONSULTATIONS:   Specialist on call teleneurology.   Patient / Family / Caregiver informed of clinical course, medical decision-making process, and agree with plan.  I discussed return precautions, follow-up instructions, and discharge instructions with patient and/or family.   ___________________________________________   FINAL CLINICAL IMPRESSION(S) / ED DIAGNOSES   Final diagnoses:  Nonspecific chest pain  Near syncope              Note: This dictation was prepared with Dragon dictation. Any transcriptional errors that result from this process are unintentional    Lisa Roca, MD 11/08/16 [phone]

## 2016-11-08 NOTE — ED Triage Notes (Signed)
While eating at [employer], wife noticed that patient became pale, diaphoretic, and was out of it.  Patient "came to" and said he was feeling dizzy.  Patient was able to walk outside from restaurant, vomited x 1 and began c/o chest pain radiating to left neck and left shoulder.

## 2016-11-08 NOTE — ED Notes (Signed)
Patient is AAOx3. Skin warm and dry.  Patient is anxious and tearful at times.  Emotional support given with minimal effect.  Continue to monitor.

## 2016-11-13 ENCOUNTER — Encounter: Payer: Self-pay | Admitting: Internal Medicine

## 2016-11-13 ENCOUNTER — Ambulatory Visit (INDEPENDENT_AMBULATORY_CARE_PROVIDER_SITE_OTHER): Payer: 59 | Admitting: Internal Medicine

## 2016-11-13 VITALS — BP 134/92 | HR 83 | Ht 74.0 in | Wt 261.5 lb

## 2016-11-13 DIAGNOSIS — R55 Syncope and collapse: Secondary | ICD-10-CM

## 2016-11-13 DIAGNOSIS — E785 Hyperlipidemia, unspecified: Secondary | ICD-10-CM | POA: Diagnosis not present

## 2016-11-13 DIAGNOSIS — I10 Essential (primary) hypertension: Secondary | ICD-10-CM

## 2016-11-13 DIAGNOSIS — R079 Chest pain, unspecified: Secondary | ICD-10-CM

## 2016-11-13 MED ORDER — NITROGLYCERIN 0.4 MG SL SUBL: 0.4000 mg | SUBLINGUAL_TABLET | SUBLINGUAL | 3 refills | Status: DC | PRN

## 2016-11-13 MED ORDER — ASPIRIN EC 81 MG PO TBEC: 81.0000 mg | DELAYED_RELEASE_TABLET | Freq: Every day | ORAL | 3 refills | Status: DC

## 2016-11-13 NOTE — Patient Instructions (Addendum)
Medication Instructions:  Your physician has recommended you make the following change in your medication:  1- START Aspirin 81 mg by mouth once a day. 2- Nitroglycerin (sublingual) as needed for chest pain.       - If a single episode of chest pain is not relieved by one tablet, the patient will try another within 5 minutes x3 doses; and if this doesn't relieve the pain, the patient is instructed to call 911 for transportation to an emergency department.  Labwork: - None ordered.   Testing/Procedures: Your physician has requested that you have en exercise stress myoview. For further information please visit [email or web address]. Please follow instruction sheet, as given.  [place]  Your caregiver has ordered a Stress Test with nuclear imaging. The purpose of this test is to evaluate the blood supply to your heart muscle. This procedure is referred to as a "Non-Invasive Stress Test." This is because other than having an IV started in your vein, nothing is inserted or "invades" your body. Cardiac stress tests are done to find areas of poor blood flow to the heart by determining the extent of coronary artery disease (CAD). Some patients exercise on a treadmill, which naturally increases the blood flow to your heart, while others who are  unable to walk on a treadmill due to physical limitations have a pharmacologic/chemical stress agent called Lexiscan . This medicine will mimic walking on a treadmill by temporarily increasing your coronary blood flow.   Please note: these test may take anywhere between 2-4 hours to complete  PLEASE REPORT TO [place] AT THE FIRST DESK WILL DIRECT YOU WHERE TO GO  Date of Procedure:_______01/15/18________  Arrival Time for Procedure:______07:15 AM    _________  Instructions regarding medication:   DO NOT TAKE YOUR Metformin the morning of the procedure.  Any additional morning medications you may take with a small sip  of water the morning of with the exception of your Metformin.   PLEASE NOTIFY THE OFFICE AT LEAST 25 HOURS IN ADVANCE IF YOU ARE UNABLE TO KEEP YOUR APPOINTMENT.  [phone] AND  PLEASE NOTIFY NUCLEAR MEDICINE AT [facility] AT LEAST 24 HOURS IN ADVANCE IF YOU ARE UNABLE TO KEEP YOUR APPOINTMENT. [phone]  How to prepare for your Myoview test:  1. Do not eat or drink after midnight 2. No caffeine for 24 hours prior to test 3. No smoking 24 hours prior to test. 4. Your medication may be taken with water.  If your doctor stopped a medication because of this test, do not take that medication. 5. Ladies, please do not wear dresses.  Skirts or pants are appropriate. Please wear a short sleeve shirt. 6. No perfume, cologne or lotion. 7. Wear comfortable walking shoes. No heels!    Follow-Up: Your physician recommends that you schedule a follow-up appointment in: [place].   If you need a refill on your cardiac medications before your next appointment, please call your pharmacy.  Cardiac Nuclear Scanning A cardiac nuclear scan is used to check your heart for problems, such as the following:  A portion of the heart is not getting enough blood.  Part of the heart muscle has died, which happens with a heart attack.  The heart wall is not working normally.  In this test, a radioactive dye (tracer) is injected into your bloodstream. After the tracer has traveled to your heart, a scanning device is used to measure how much of the tracer is absorbed  by or distributed to various areas of your heart. LET [facility] CARE PROVIDER KNOW ABOUT:  Any allergies you have.  All medicines you are taking, including vitamins, herbs, eye drops, creams, and over-the-counter medicines.  Previous problems you or members of your family have had with the use of anesthetics.  Any blood disorders you have.  Previous surgeries you have had.  Medical conditions you have.  RISKS AND  COMPLICATIONS Generally, this is a safe procedure. However, as with any procedure, problems can occur. Possible problems include:   Serious chest pain.  Rapid heartbeat.  Sensation of warmth in your chest. This usually passes quickly. BEFORE THE PROCEDURE Ask your health care provider about changing or stopping your regular medicines. PROCEDURE This procedure is usually done at a hospital and takes 2-4 hours.  An IV tube is inserted into one of your veins.  Your health care provider will inject a small amount of radioactive tracer through the tube.  You will then wait for 20-40 minutes while the tracer travels through your bloodstream.  You will lie down on an exam table so images of your heart can be taken. Images will be taken for about 15-20 minutes.  You will exercise on a treadmill or stationary bike. While you exercise, your heart activity will be monitored with an electrocardiogram (ECG), and your blood pressure will be checked.  If you are unable to exercise, you may be given a medicine to make your heart beat faster.  When blood flow to your heart has peaked, tracer will again be injected through the IV tube.  After 20-40 minutes, you will get back on the exam table and have more images taken of your heart.  When the procedure is over, your IV tube will be removed. AFTER THE PROCEDURE  You will likely be able to leave shortly after the test. Unless your health care provider tells you otherwise, you may return to your normal schedule, including diet, activities, and medicines.  Make sure you find out how and when you will get your test results. This information is not intended to replace advice given to you by your health care provider. Make sure you discuss any questions you have with your health care provider. Document Released: 11/20/2004 Document Revised: 10/31/2013 Document Reviewed: 10/04/2013 Elsevier Interactive Patient Education  2017 [employer].

## 2016-11-13 NOTE — Progress Notes (Signed)
New Outpatient Visit Date: 11/13/2016  Primary Care Provider: Coral Spikes, DO [address] [place], [place] [address]  Chief Complaint: Chest pain and syncope  HPI:  [name] is a 46 y.o. year-old male with history of type 2 diabetes mellitus, hypertension, hyperlipidemia, GERD, low testosterone, asthma, and anxiety/depressions, who presents for evaluation after syncopal episode with accompanying chest pain last week. 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 He went to lunch with his wife and friends and began complaining of sharp left-sided chest and neck pain. While seated at the table he briefly passed out, came to and then pressed out again. Afterwards, he was nauseated and confused. He was taken to the [facility] emergency department by his wife, where evaluation was unrevealing, including normal EKG, negative troponin 2, normal CTA head/neck, and normal MRI brain. For 2 days after the episode, the patient continued to have vague chest pain and exertional shortness of breath. The chest pain was not clearly exertional. [name] does not know of any exacerbating or alleviating factors. The patient reports that he has had previous cardiovascular evaluations, including an episode of syncope and chest pain many years ago. However, at that time he reports being under more stress and believed that this was the cause of his symptoms. It should be noted, the patient does not have much recollection of the event on 11/08/16; most of the details are provided by his wife. He believes his most recent stress test was about 4 years ago, though the most recent stress test available in our records (including Care Everywhere) is from 2009.  At this time, the patient is asymptomatic without chest pain, shortness of breath, palpitations, and lightheadedness. In the past, his blood pressure was poorly controlled, though he  notes that it is now normal without any pharmacologic therapy. He has been trying to watch his diet. He does not exercise regularly but feels that his fitness is better than it was in the past. He did not have any warning signs that he was going to pass out, including palpitations and progressive lightheadedness.  --------------------------------------------------------------------------------------------------  Cardiovascular History & Procedures: Cardiovascular Problems:  Syncope  Chest pain  Risk Factors:  Hypertension, hyperlipidemia, diabetes mellitus, and male gender  Cath/PCI:  None  CV Surgery:  None  EP Procedures and Devices:  Patient reports normal heart monitor several years ago, the results are not available for review.  Non-Invasive Evaluation(s):  Transthoracic echocardiogram (01/01/14): Technically challenging study. Normal LV function (EF 55-60%). Mild LVH. Aortic sclerosis without stenosis. Normal RV size and function.  Exercise myocardial perfusion stress test (03/19/08): Probably normal study, the patient failed to reach target heart rate. Small fixed anterior defect was noted, felt to be attenuation artifact. No ischemia identified. LVEF 64%.  Recent CV Pertinent Labs: Lab Results  Component Value Date   CHOL 182 12/23/2015   CHOL 119 01/02/2014   HDL 28.50 (L) 12/23/2015   HDL 25 (L) 01/02/2014   LDLCALC 47 01/02/2014   LDLDIRECT 99.0 12/23/2015   TRIG 316.0 (H) 12/23/2015   TRIG 234 (H) 01/02/2014   CHOLHDL 6 12/23/2015   BNP 35 12/31/2013   K 4.0 11/08/2016   K 4.5 01/02/2014   MG 2.0 07/23/2010   BUN 20 11/08/2016   BUN 13 01/02/2014   CREATININE 1.13 11/08/2016   CREATININE 1.37 (H) 01/02/2014    --------------------------------------------------------------------------------------------------  Past Medical  History:  Diagnosis Date  . Allergy   . Anxiety   . Arthritis   . Asthma   . Back pain   . Chicken pox   . Depression   .  Diabetes mellitus without complication ([place])   . Frequent headaches   . GERD (gastroesophageal reflux disease)   . Hyperlipidemia   . Hypertension   . Kidney stones   . Low testosterone   . Migraines     Past Surgical History:  Procedure Laterality Date  . OSTECTOMY METATARSAL Right 2003    Current Outpatient Prescriptions on File Prior to Visit  Medication Sig Dispense Refill  . glucose blood test strip 1 each by Other route as needed for other (true matrix glucose test strips. E11.9). Use as instructed    . Lancets (ONETOUCH ULTRASOFT) lancets Use as instructed 100 each 12  . metFORMIN (GLUCOPHAGE) 500 MG tablet Take 2 tablets (1,000 mg total) by mouth 2 (two) times daily with a meal. 2 tabs in am, 2 tab in pm 360 tablet 3  . Testosterone (ANDROGEL PUMP) 20.25 MG/ACT (1.62%) GEL Apply 4 Doses topically. Use 4 pumps (2 on each shoulder) daily.     No current facility-administered medications on file prior to visit.      Allergies: Penicillins and Penicillins  Social History   Social History  . Marital status: Married    Spouse name: N/A  . Number of children: N/A  . Years of education: N/A   Occupational History  . Not on file.   Social History Main Topics  . Smoking status: Former Smoker    Packs/day: 2.00    Years: 25.00    Types: Cigarettes    Quit date: 09/17/2013  . Smokeless tobacco: Never Used  . Alcohol use No     Comment: former  . Drug use: No  . Sexual activity: Yes   Other Topics Concern  . Not on file   Social History Narrative   ** Merged History Encounter **        Family History  Problem Relation Age of Onset  . Arthritis Mother   . Cancer Mother     breast and bone  . Heart Problems Mother   . Arthritis Father   . Lymphoma Father   . Heart disease Father   . Stroke Father   . Hypertension Father   . Clotting disorder Father   . Cancer Brother     lung  . Diabetes Brother   . Diabetes Maternal Aunt   . Diabetes Maternal Aunt       Review of Systems: A 12-system review of systems was performed and was negative except as noted in the HPI.  --------------------------------------------------------------------------------------------------  Physical Exam: BP (!) 134/92 (BP Location: Left Arm, Patient Position: Sitting, Cuff Size: Large)   Pulse 83   Ht 6\' 2"  (1.88 m)   Wt 118.6 kg (261 lb 8 oz)   BMI 33.57 kg/m   General:  Obese man, seated comfortably in the exam room. He is accompanied by his wife. HEENT: No conjunctival pallor or scleral icterus.  Moist mucous membranes.  OP clear. Neck: Supple without lymphadenopathy, thyromegaly, JVD, or HJR.  No carotid bruit. Lungs: Normal work of breathing.  Clear to auscultation bilaterally without wheezes or crackles. Heart: Regular rate and rhythm without murmurs, rubs, or gallops.  Non-displaced PMI. Abd: Bowel sounds present.  Soft, NT/ND without hepatosplenomegaly Ext: No lower extremity edema.  Radial, PT, and DP pulses are 2+ bilaterally  Skin: warm and dry without rash Neuro: CNIII-XII intact.  Strength and fine-touch sensation intact in upper and lower extremities bilaterally. Psych: Normal mood and affect.  EKG:  Normal sinus rhythm without significant abnormalities.  Lab Results  Component Value Date   WBC 7.8 11/08/2016   HGB 15.4 11/08/2016   HCT 44.6 11/08/2016   MCV 87.9 11/08/2016   PLT 199 11/08/2016    Lab Results  Component Value Date   NA 136 11/08/2016   K 4.0 11/08/2016   CL 101 11/08/2016   CO2 25 11/08/2016   BUN 20 11/08/2016   CREATININE 1.13 11/08/2016   GLUCOSE 195 (H) 11/08/2016   ALT 19 09/18/2015    Lab Results  Component Value Date   CHOL 182 12/23/2015   HDL 28.50 (L) 12/23/2015   LDLCALC 47 01/02/2014   LDLDIRECT 99.0 12/23/2015   TRIG 316.0 (H) 12/23/2015   CHOLHDL 6 12/23/2015    --------------------------------------------------------------------------------------------------  ASSESSMENT AND PLAN: Syncope  and chest pain Syncope occurred while the patient was having lunch and was preceded by chest pain and left neck pain. The patient also had some nausea and vomiting around the episode. Primary concerns would for cardiac arrhythmia or vasovagal episode. Workup in the emergency department was unrevealing. EKG today is also normal. Patient has had remote echocardiogram and stress testing that did not show any significant abnormalities. We discussed further evaluation options, including stress testing, cardiac catheterization, echocardiography, and event monitoring. The patient states that he would not undergo cardiac catheterization unless absolutely necessary. We have therefore agreed to perform a myocardial perfusion stress test. We will attempt to do this with exercise, though it may need to be converted to a pharmacologic study if he is unable to reach his target heart rate (as was the case 2009). In the meantime, we will start low-dose aspirin. I have also provided the patient with a prescription for sublingual nitroglycerin to be taken as needed. Given his unexplained syncope, I recommended that he avoid driving until workup has been completed.  Hypertension Blood pressure mildly elevated today. The patient wishes to continue with lifestyle modification at this time and readdress this when the patient returns for follow-up.  Hyperlipidemia Most recent LDL was 99 and 12/2015. Given history of diabetes, the patient would benefit from being on at least moderate or high intensity statin therapy. We will readdress this after completion of the stress test, as he is hesitant to add any medications today.  Follow-up: Return to clinic in 1 month.  Nelva Bush, MD 11/15/2016 2:16 PM

## 2016-11-15 ENCOUNTER — Encounter: Payer: Self-pay | Admitting: Internal Medicine

## 2016-11-15 DIAGNOSIS — R079 Chest pain, unspecified: Secondary | ICD-10-CM | POA: Insufficient documentation

## 2016-11-15 DIAGNOSIS — R55 Syncope and collapse: Secondary | ICD-10-CM | POA: Insufficient documentation

## 2016-11-15 DIAGNOSIS — I10 Essential (primary) hypertension: Secondary | ICD-10-CM | POA: Insufficient documentation

## 2016-11-20 ENCOUNTER — Other Ambulatory Visit: Payer: Self-pay

## 2016-11-23 ENCOUNTER — Ambulatory Visit: Payer: 59

## 2016-12-07 ENCOUNTER — Other Ambulatory Visit: Payer: Self-pay | Admitting: Family Medicine

## 2016-12-07 NOTE — Telephone Encounter (Signed)
Last seen 12/2015 along with last A1C. Medication refilled 12/23/15. Please advise?

## 2016-12-10 ENCOUNTER — Telehealth: Payer: Self-pay | Admitting: *Deleted

## 2016-12-10 NOTE — Telephone Encounter (Signed)
S/w with patient. He had cancelled his myoview stress test.  Patient stated it was going to be a $900 co-pay and he did not see the point of paying that and did not have the funds to pay that.  Discussed with patient that the test would show [place] the blood flow to his heart and any potential blockages as he was having symptoms of chest pain and syncope. Patient verbalized understanding and declined to reschedule myoview and also wanted to cancel his appointment with Dr End next week. Patient denies having any chest pain, SOB, palpitations, nausea, vomiting, dizziness since the last episode. 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 Advised patient to call 911 and to go the ER if he experiences any of his symptoms again. Patient verbalized understanding.  Routing to Dr End as an Juluis Rainier.

## 2016-12-11 NOTE — Telephone Encounter (Signed)
Thank you for the update!

## 2016-12-15 ENCOUNTER — Ambulatory Visit: Payer: Self-pay | Admitting: Internal Medicine

## 2016-12-24 DIAGNOSIS — N529 Male erectile dysfunction, unspecified: Secondary | ICD-10-CM | POA: Diagnosis not present

## 2016-12-24 DIAGNOSIS — D239 Other benign neoplasm of skin, unspecified: Secondary | ICD-10-CM | POA: Insufficient documentation

## 2016-12-24 DIAGNOSIS — N401 Enlarged prostate with lower urinary tract symptoms: Secondary | ICD-10-CM | POA: Diagnosis not present

## 2016-12-24 DIAGNOSIS — E291 Testicular hypofunction: Secondary | ICD-10-CM | POA: Diagnosis not present

## 2016-12-24 DIAGNOSIS — E1149 Type 2 diabetes mellitus with other diabetic neurological complication: Secondary | ICD-10-CM | POA: Diagnosis not present

## 2016-12-24 DIAGNOSIS — Z79899 Other long term (current) drug therapy: Secondary | ICD-10-CM | POA: Diagnosis not present

## 2016-12-24 DIAGNOSIS — Z87442 Personal history of urinary calculi: Secondary | ICD-10-CM | POA: Diagnosis not present

## 2016-12-24 DIAGNOSIS — N138 Other obstructive and reflux uropathy: Secondary | ICD-10-CM | POA: Diagnosis not present

## 2017-03-29 DIAGNOSIS — H524 Presbyopia: Secondary | ICD-10-CM | POA: Diagnosis not present

## 2017-03-29 DIAGNOSIS — H52223 Regular astigmatism, bilateral: Secondary | ICD-10-CM | POA: Diagnosis not present

## 2017-03-29 DIAGNOSIS — E119 Type 2 diabetes mellitus without complications: Secondary | ICD-10-CM | POA: Diagnosis not present

## 2017-03-29 DIAGNOSIS — H5213 Myopia, bilateral: Secondary | ICD-10-CM | POA: Diagnosis not present

## 2017-03-29 LAB — HM DIABETES EYE EXAM

## 2017-04-08 ENCOUNTER — Other Ambulatory Visit: Payer: Self-pay | Admitting: Family Medicine

## 2017-04-08 ENCOUNTER — Ambulatory Visit (INDEPENDENT_AMBULATORY_CARE_PROVIDER_SITE_OTHER): Payer: 59 | Admitting: Family Medicine

## 2017-04-08 ENCOUNTER — Encounter: Payer: Self-pay | Admitting: Family Medicine

## 2017-04-08 VITALS — BP 124/96 | HR 69 | Temp 97.9°F | Resp 16 | Wt 261.0 lb

## 2017-04-08 DIAGNOSIS — E119 Type 2 diabetes mellitus without complications: Secondary | ICD-10-CM | POA: Diagnosis not present

## 2017-04-08 DIAGNOSIS — Z0000 Encounter for general adult medical examination without abnormal findings: Secondary | ICD-10-CM | POA: Diagnosis not present

## 2017-04-08 LAB — LIPID PANEL
CHOL/HDL RATIO: 5
CHOLESTEROL: 180 mg/dL (ref 0–200)
HDL: 33.2 mg/dL — ABNORMAL LOW (ref >39.00)
NONHDL: 146.68
TRIGLYCERIDES: 273 mg/dL — AB (ref 0.0–149.0)
VLDL: 54.6 mg/dL — AB (ref 0.0–40.0)

## 2017-04-08 LAB — VITAMIN D 25 HYDROXY (VIT D DEFICIENCY, FRACTURES): VITD: 15.65 ng/mL — ABNORMAL LOW (ref 30.00–100.00)

## 2017-04-08 LAB — CBC
HCT: 45 % (ref 39.0–52.0)
Hemoglobin: 15.3 g/dL (ref 13.0–17.0)
MCHC: 33.9 g/dL (ref 30.0–36.0)
MCV: 89.3 fl (ref 78.0–100.0)
Platelets: 195 10*3/uL (ref 150.0–400.0)
RBC: 5.04 Mil/uL (ref 4.22–5.81)
RDW: 12.9 % (ref 11.5–15.5)
WBC: 6.8 10*3/uL (ref 4.0–10.5)

## 2017-04-08 LAB — COMPREHENSIVE METABOLIC PANEL
ALBUMIN: 4.3 g/dL (ref 3.5–5.2)
ALT: 18 U/L (ref 0–53)
AST: 17 U/L (ref 0–37)
Alkaline Phosphatase: 46 U/L (ref 39–117)
BUN: 20 mg/dL (ref 6–23)
CHLORIDE: 104 meq/L (ref 96–112)
CO2: 22 meq/L (ref 19–32)
CREATININE: 1.09 mg/dL (ref 0.40–1.50)
Calcium: 9.1 mg/dL (ref 8.4–10.5)
GFR: 77.81 mL/min (ref >60.00)
Glucose, Bld: 129 mg/dL — ABNORMAL HIGH (ref 70–99)
POTASSIUM: 4.3 meq/L (ref 3.5–5.1)
SODIUM: 133 meq/L — AB (ref 135–145)
Total Bilirubin: 0.4 mg/dL (ref 0.2–1.2)
Total Protein: 7.4 g/dL (ref 6.0–8.3)

## 2017-04-08 LAB — MICROALBUMIN / CREATININE URINE RATIO
CREATININE, U: 153.4 mg/dL
MICROALB UR: 4.5 mg/dL — AB (ref 0.0–1.9)
Microalb Creat Ratio: 2.9 mg/g (ref 0.0–30.0)

## 2017-04-08 LAB — HEMOGLOBIN A1C: Hgb A1c MFr Bld: 7 % — ABNORMAL HIGH (ref 4.6–6.5)

## 2017-04-08 LAB — LDL CHOLESTEROL, DIRECT: Direct LDL: 96 mg/dL

## 2017-04-08 NOTE — Patient Instructions (Signed)
We will arrange the stress test.  We will call with your labs.  Follow up at least annually  Take care  Dr. Lacinda Axon    Health Maintenance, Male A healthy lifestyle and preventive care is important for your health and wellness. Ask your health care provider about what schedule of regular examinations is right for you. What should I know about weight and diet? Eat a Healthy Diet  Eat plenty of vegetables, fruits, whole grains, low-fat dairy products, and lean protein.  Do not eat a lot of foods high in solid fats, added sugars, or salt.  Maintain a Healthy Weight Regular exercise can help you achieve or maintain a healthy weight. You should:  Do at least 150 minutes of exercise each week. The exercise should increase your heart rate and make you sweat (moderate-intensity exercise).  Do strength-training exercises at least twice a week.  Watch Your Levels of Cholesterol and Blood Lipids  Have your blood tested for lipids and cholesterol every 5 years starting at 45 years of age. If you are at high risk for heart disease, you should start having your blood tested when you are 45 years old. You may need to have your cholesterol levels checked more often if: ? Your lipid or cholesterol levels are high. ? You are older than 45 years of age. ? You are at high risk for heart disease.  What should I know about cancer screening? Many types of cancers can be detected early and may often be prevented. Lung Cancer  You should be screened every year for lung cancer if: ? You are a current smoker who has smoked for at least 30 years. ? You are a former smoker who has quit within the past 15 years.  Talk to your health care provider about your screening options, when you should start screening, and how often you should be screened.  Colorectal Cancer  Routine colorectal cancer screening usually begins at 45 years of age and should be repeated every 5-10 years until you are 45 years old. You  may need to be screened more often if early forms of precancerous polyps or small growths are found. Your health care provider may recommend screening at an earlier age if you have risk factors for colon cancer.  Your health care provider may recommend using home test kits to check for hidden blood in the stool.  A small camera at the end of a tube can be used to examine your colon (sigmoidoscopy or colonoscopy). This checks for the earliest forms of colorectal cancer.  Prostate and Testicular Cancer  Depending on your age and overall health, your health care provider may do certain tests to screen for prostate and testicular cancer.  Talk to your health care provider about any symptoms or concerns you have about testicular or prostate cancer.  Skin Cancer  Check your skin from head to toe regularly.  Tell your health care provider about any new moles or changes in moles, especially if: ? There is a change in a mole's size, shape, or color. ? You have a mole that is larger than a pencil eraser.  Always use sunscreen. Apply sunscreen liberally and repeat throughout the day.  Protect yourself by wearing long sleeves, pants, a wide-brimmed hat, and sunglasses when outside.  What should I know about heart disease, diabetes, and high blood pressure?  If you are 6-75 years of age, have your blood pressure checked every 3-5 years. If you are 2 years of age  or older, have your blood pressure checked every year. You should have your blood pressure measured twice-once when you are at a hospital or clinic, and once when you are not at a hospital or clinic. Record the average of the two measurements. To check your blood pressure when you are not at a hospital or clinic, you can use: ? An automated blood pressure machine at a pharmacy. ? A home blood pressure monitor.  Talk to your health care provider about your target blood pressure.  If you are between 48-23 years old, ask your health care  provider if you should take aspirin to prevent heart disease.  Have regular diabetes screenings by checking your fasting blood sugar level. ? If you are at a normal weight and have a low risk for diabetes, have this test once every three years after the age of 52. ? If you are overweight and have a high risk for diabetes, consider being tested at a younger age or more often.  A one-time screening for abdominal aortic aneurysm (AAA) by ultrasound is recommended for men aged 21-75 years who are current or former smokers. What should I know about preventing infection? Hepatitis B If you have a higher risk for hepatitis B, you should be screened for this virus. Talk with your health care provider to find out if you are at risk for hepatitis B infection. Hepatitis C Blood testing is recommended for:  Everyone born from [birth year] through 1964.  Anyone with known risk factors for hepatitis C.  Sexually Transmitted Diseases (STDs)  You should be screened each year for STDs including gonorrhea and chlamydia if: ? You are sexually active and are younger than 45 years of age. ? You are older than 45 years of age and your health care provider tells you that you are at risk for this type of infection. ? Your sexual activity has changed since you were last screened and you are at an increased risk for chlamydia or gonorrhea. Ask your health care provider if you are at risk.  Talk with your health care provider about whether you are at high risk of being infected with HIV. Your health care provider may recommend a prescription medicine to help prevent HIV infection.  What else can I do?  Schedule regular health, dental, and eye exams.  Stay current with your vaccines (immunizations).  Do not use any tobacco products, such as cigarettes, chewing tobacco, and e-cigarettes. If you need help quitting, ask your health care provider.  Limit alcohol intake to no more than 2 drinks per day. One drink equals 12  ounces of beer, 5 ounces of wine, or 1 ounces of hard liquor.  Do not use street drugs.  Do not share needles.  Ask your health care provider for help if you need support or information about quitting drugs.  Tell your health care provider if you often feel depressed.  Tell your health care provider if you have ever been abused or do not feel safe at home. This information is not intended to replace advice given to you by your health care provider. Make sure you discuss any questions you have with your health care provider. Document Released: 04/23/2008 Document Revised: 06/24/2016 Document Reviewed: 07/30/2015 Elsevier Interactive Patient Education  [employer].

## 2017-04-08 NOTE — Progress Notes (Signed)
Subjective:  Patient ID: TARAN HAYNESWORTH, male    DOB: 1971/04/18  Age: 46 y.o. MRN: [id number]  CC: Annual physical  HPI SHANNEN VERNON is a 46 y.o. male presents to the clinic today for an annual exam.  Preventative Healthcare  Immunizations  Tetanus - Up to date.   Pneumococcal - Declines.   Labs: Labs today.   Exercise: No regular exercise.   Alcohol use: No.  Smoking/tobacco use: Former.  PMH, Surgical Hx, Family Hx, Social History reviewed and updated as below.  Past Medical History:  Diagnosis Date  . Allergy   . Anxiety   . Arthritis   . Asthma   . Chicken pox   . Diabetes mellitus without complication ([place])   . GERD (gastroesophageal reflux disease)   . Hyperlipidemia   . Hypertension   . Kidney stones   . Low testosterone   . Migraines    Past Surgical History:  Procedure Laterality Date  . OSTECTOMY METATARSAL Right 2003   Family History  Problem Relation Age of Onset  . Arthritis Mother   . Cancer Mother        breast and bone  . Heart Problems Mother   . Arthritis Father   . Lymphoma Father   . Heart disease Father   . Stroke Father   . Hypertension Father   . Clotting disorder Father   . Cancer Brother        lung  . Heart disease Brother        atriall fibrillation  . Diabetes Brother   . Heart disease Brother        atrial fibrillation  . Diabetes Maternal Aunt   . Diabetes Maternal Aunt    Social History  Substance Use Topics  . Smoking status: Former Smoker    Packs/day: 2.00    Years: 25.00    Types: Cigarettes    Quit date: 09/17/2013  . Smokeless tobacco: Never Used  . Alcohol use No     Comment: former   Review of Systems General: Denies unexplained weight loss, fever. Skin: Denies new or changing mole, sore/wound that won't heal. ENT: Trouble hearing, ringing in the ears, sores in the mouth, hoarseness, trouble swallowing. Eyes: Denies trouble seeing/visual disturbance. Heart/CV: Denies chest pain,  shortness of breath, edema, palpitations. Lungs/Resp: Denies cough, shortness of breath, hemoptysis. Abd/GI: Denies nausea, vomiting, diarrhea, constipation, abdominal pain, hematochezia, melena. GU: Denies dysuria, incontinence, hematuria, urinary frequency, difficulty starting/keeping stream, penile discharge, sexual difficulty, lump in testicles. MSK: Denies joint pain/swelling, myalgias. Neuro: Denies headaches, weakness, numbness, dizziness, syncope. Psych: Denies sadness, anxiety, stress, memory difficulty. Endocrine: Denies polyuria and polydipsia.  Objective:   Today's Vitals: BP (!) 124/96 (BP Location: Right Arm, Patient Position: Sitting, Cuff Size: Large)   Pulse 69   Temp 97.9 F (36.6 C) (Oral)   Resp 16   Wt 261 lb (118.4 kg)   SpO2 98%   BMI 33.51 kg/m   Physical Exam  Constitutional: He is oriented to person, place, and time. He appears well-developed. No distress.  HENT:  Head: Normocephalic and atraumatic.  Mouth/Throat: Oropharynx is clear and moist.  Eyes: Conjunctivae are normal. No scleral icterus.  Neck: Normal range of motion. Neck supple.  Cardiovascular: Normal rate and regular rhythm.   Pulmonary/Chest: Effort normal and breath sounds normal. He has no wheezes. He has no rales.  Abdominal: Soft. He exhibits no distension. There is no tenderness. There is no rebound and no guarding.  Musculoskeletal:  Normal range of motion.  Neurological: He is alert and oriented to person, place, and time.  Skin: Skin is warm. No rash noted.  Psychiatric: He has a normal mood and affect.  Vitals reviewed.  Assessment & Plan:   Problem List Items Addressed This Visit      Endocrine   Type 2 diabetes mellitus ([place])   Relevant Orders   Microalbumin / creatinine urine ratio     Other   Annual physical exam - Primary    Labs today. Declines immunizations. Continue meds. Has had a recent bout of diaphoresis/syncope. Cardiology has recommended stress. Will  arrange.      Relevant Orders   CBC   Hemoglobin A1c   Comprehensive metabolic panel   Lipid panel   Vitamin D (25 hydroxy)     Follow-up: Annually  [place]

## 2017-04-08 NOTE — Assessment & Plan Note (Signed)
Labs today. Declines immunizations. Continue meds. Has had a recent bout of diaphoresis/syncope. Cardiology has recommended stress. Will arrange.

## 2017-04-09 MED ORDER — METFORMIN HCL 500 MG PO TABS: 1000.0000 mg | ORAL_TABLET | Freq: Two times a day (BID) | ORAL | 1 refills | Status: DC

## 2017-04-12 ENCOUNTER — Other Ambulatory Visit: Payer: Self-pay | Admitting: Family Medicine

## 2017-04-12 MED ORDER — ROSUVASTATIN CALCIUM 20 MG PO TABS: 20.0000 mg | ORAL_TABLET | Freq: Every day | ORAL | 3 refills | Status: DC

## 2017-05-07 ENCOUNTER — Encounter: Payer: Self-pay | Admitting: Family Medicine

## 2017-05-07 ENCOUNTER — Ambulatory Visit (INDEPENDENT_AMBULATORY_CARE_PROVIDER_SITE_OTHER): Payer: 59 | Admitting: Family Medicine

## 2017-05-07 ENCOUNTER — Ambulatory Visit (INDEPENDENT_AMBULATORY_CARE_PROVIDER_SITE_OTHER): Payer: 59

## 2017-05-07 VITALS — BP 124/86 | HR 86 | Temp 98.4°F | Wt 259.1 lb

## 2017-05-07 DIAGNOSIS — R310 Gross hematuria: Secondary | ICD-10-CM | POA: Diagnosis not present

## 2017-05-07 DIAGNOSIS — R300 Dysuria: Secondary | ICD-10-CM | POA: Diagnosis not present

## 2017-05-07 DIAGNOSIS — N200 Calculus of kidney: Secondary | ICD-10-CM | POA: Diagnosis not present

## 2017-05-07 DIAGNOSIS — R319 Hematuria, unspecified: Secondary | ICD-10-CM | POA: Diagnosis not present

## 2017-05-07 LAB — POCT URINALYSIS DIP (MANUAL ENTRY)
Bilirubin, UA: NEGATIVE
Glucose, UA: NEGATIVE mg/dL
Ketones, POC UA: NEGATIVE mg/dL
LEUKOCYTES UA: NEGATIVE
Nitrite, UA: NEGATIVE
Urobilinogen, UA: 0.2 E.U./dL
pH, UA: 5 (ref 5.0–8.0)

## 2017-05-07 LAB — URINALYSIS, MICROSCOPIC ONLY

## 2017-05-07 MED ORDER — TAMSULOSIN HCL 0.4 MG PO CAPS: 0.4000 mg | ORAL_CAPSULE | Freq: Every day | ORAL | 0 refills | Status: DC

## 2017-05-07 MED ORDER — HYDROCODONE-ACETAMINOPHEN 5-325 MG PO TABS: 1.0000 | ORAL_TABLET | Freq: Four times a day (QID) | ORAL | 0 refills | Status: DC | PRN

## 2017-05-07 NOTE — Assessment & Plan Note (Signed)
New problem. Patient with recent hematuria and symptoms of nephrolithiasis. UA with large blood. Micro with 11-20 RBC's. KUB with stone in the kidney (not in ureter). Flomax daily. Hydration. Vicodin Rx given for acute pain ([place] database reviewed and there were no recent Rx's for narcotics). 5 day supply given for acute pain (Per [place] Stop Act). No evidence of UTI.

## 2017-05-07 NOTE — Patient Instructions (Signed)
We will call with the results.  Flomax daily.  Pain medication as needed.  Take care  Dr. Lacinda Axon

## 2017-05-07 NOTE — Progress Notes (Signed)
Subjective:  Patient ID: William Frey, male    DOB: November 05, 1971  Age: 46 y.o. MRN: [id number]  CC: Hematuria, abdominal pain, urinary frequency  HPI:  46 year old male with DM-2, history of kidney stones presents with the above complaints.  Approximately 3 weeks ago he had LLQ abdominal pain and hematuria. He feels that he passed a stone. Symptoms resolved. He subsequently developed abdominal/flank pain again. 2 days ago developed urinary frequency. No fever. He has been working outside in the heat. He has been trying to stay hydrated. No current flank pain/abdominal pain. He is concerned that he may have another stone or a UTI. No other complaints or concerns at this time.  Social Hx   Social History   Social History  . Marital status: Married    Spouse name: N/A  . Number of children: N/A  . Years of education: N/A   Social History Main Topics  . Smoking status: Former Smoker    Packs/day: 2.00    Years: 25.00    Types: Cigarettes    Quit date: 09/17/2013  . Smokeless tobacco: Never Used  . Alcohol use No     Comment: former  . Drug use: No  . Sexual activity: Yes   Other Topics Concern  . None   Social History Narrative   ** Merged History Encounter **        Review of Systems  Gastrointestinal: Positive for abdominal pain.  Genitourinary: Positive for flank pain and frequency.   Objective:  BP 124/86 (BP Location: Left Arm, Patient Position: Sitting, Cuff Size: Large)   Pulse 86   Temp 98.4 F (36.9 C) (Oral)   Wt 259 lb 2 oz (117.5 kg)   SpO2 97%   BMI 33.27 kg/m   BP/Weight 05/07/2017 2/94/7654 04/13/353  Systolic BP 656 812 751  Diastolic BP 86 96 92  Wt. (Lbs) 259.13 261 261.5  BMI 33.27 33.51 33.57    Physical Exam  Constitutional: He is oriented to person, place, and time. He appears well-developed. No distress.  Cardiovascular: Normal rate and regular rhythm.   Pulmonary/Chest: Effort normal and breath sounds normal.  Abdominal:  No CVA  tenderness. Abdomen soft, nondistended, nontender.  Neurological: He is alert and oriented to person, place, and time.  Psychiatric: He has a normal mood and affect.  Vitals reviewed.  Results for orders placed or performed in visit on 05/07/17 (from the past 24 hour(s))  POCT urinalysis dipstick     Status: Abnormal   Collection Time: 05/07/17  3:21 PM  Result Value Ref Range   Color, UA yellow yellow   Clarity, UA clear clear   Glucose, UA negative negative mg/dL   Bilirubin, UA negative negative   Ketones, POC UA negative negative mg/dL   Spec Grav, UA >=1.030 (A) 1.010 - 1.025   Blood, UA large (A) negative   pH, UA 5.0 5.0 - 8.0   Protein Ur, POC =30 (A) negative mg/dL   Urobilinogen, UA 0.2 0.2 or 1.0 E.U./dL   Nitrite, UA Negative Negative   Leukocytes, UA Negative Negative  Urine Microscopic Only     Status: Abnormal   Collection Time: 05/07/17  3:34 PM  Result Value Ref Range   WBC, UA 3-6/hpf (A) 0-2/hpf   RBC / HPF 11-20/hpf (A) 0-2/hpf   Squamous Epithelial / LPF Rare(0-4/hpf) Rare(0-4/hpf)    Assessment & Plan:   Problem List Items Addressed This Visit      Genitourinary   Gross  hematuria - Primary    New problem. Patient with recent hematuria and symptoms of nephrolithiasis. UA with large blood. Micro with 11-20 RBC's. KUB with stone in the kidney (not in ureter). Flomax daily. Hydration. Vicodin Rx given for acute pain ([facility] database reviewed and there were no recent Rx's for narcotics). 5 day supply given for acute pain (Per  Stop Act). No evidence of UTI.      Relevant Orders   POCT urinalysis dipstick (Completed)   Urine Culture   Urine Microscopic Only (Completed)   DG Abd 1 View (Completed)      Meds ordered this encounter  Medications  . tamsulosin (FLOMAX) 0.4 MG CAPS capsule    Sig: Take 1 capsule (0.4 mg total) by mouth daily.    Dispense:  30 capsule    Refill:  0  . HYDROcodone-acetaminophen (NORCO/VICODIN) 5-325 MG tablet    Sig:  Take 1 tablet by mouth every 6 (six) hours as needed for moderate pain.    Dispense:  20 tablet    Refill:  0    Follow-up: PRN  [place]

## 2017-05-08 LAB — URINE CULTURE: ORGANISM ID, BACTERIA: NO GROWTH

## 2017-05-21 DIAGNOSIS — G44309 Post-traumatic headache, unspecified, not intractable: Secondary | ICD-10-CM | POA: Diagnosis not present

## 2017-05-21 DIAGNOSIS — Z041 Encounter for examination and observation following transport accident: Secondary | ICD-10-CM | POA: Diagnosis not present

## 2017-05-21 DIAGNOSIS — M542 Cervicalgia: Secondary | ICD-10-CM | POA: Diagnosis not present

## 2017-05-21 DIAGNOSIS — S22039A Unspecified fracture of third thoracic vertebra, initial encounter for closed fracture: Secondary | ICD-10-CM | POA: Diagnosis not present

## 2017-05-21 DIAGNOSIS — M549 Dorsalgia, unspecified: Secondary | ICD-10-CM | POA: Diagnosis not present

## 2017-05-21 DIAGNOSIS — M8588 Other specified disorders of bone density and structure, other site: Secondary | ICD-10-CM | POA: Diagnosis not present

## 2017-05-21 DIAGNOSIS — R51 Headache: Secondary | ICD-10-CM | POA: Diagnosis not present

## 2017-05-21 DIAGNOSIS — Y999 Unspecified external cause status: Secondary | ICD-10-CM | POA: Diagnosis not present

## 2017-05-21 DIAGNOSIS — S22038A Other fracture of third thoracic vertebra, initial encounter for closed fracture: Secondary | ICD-10-CM | POA: Diagnosis not present

## 2017-05-21 DIAGNOSIS — S22030A Wedge compression fracture of third thoracic vertebra, initial encounter for closed fracture: Secondary | ICD-10-CM | POA: Diagnosis not present

## 2017-05-21 DIAGNOSIS — Y92410 Unspecified street and highway as the place of occurrence of the external cause: Secondary | ICD-10-CM | POA: Diagnosis not present

## 2017-05-24 ENCOUNTER — Encounter: Payer: Self-pay | Admitting: Family Medicine

## 2017-05-25 ENCOUNTER — Telehealth: Payer: Self-pay | Admitting: Family Medicine

## 2017-05-25 NOTE — Telephone Encounter (Signed)
Encounter opened to view patient's outside records.

## 2017-05-26 ENCOUNTER — Other Ambulatory Visit: Payer: Self-pay | Admitting: Family Medicine

## 2017-05-26 DIAGNOSIS — S22030A Wedge compression fracture of third thoracic vertebra, initial encounter for closed fracture: Secondary | ICD-10-CM

## 2017-05-26 DIAGNOSIS — M4854XA Collapsed vertebra, not elsewhere classified, thoracic region, initial encounter for fracture: Secondary | ICD-10-CM | POA: Diagnosis not present

## 2017-05-27 ENCOUNTER — Other Ambulatory Visit: Payer: Self-pay | Admitting: Physician Assistant

## 2017-05-27 DIAGNOSIS — M4854XA Collapsed vertebra, not elsewhere classified, thoracic region, initial encounter for fracture: Secondary | ICD-10-CM

## 2017-06-01 ENCOUNTER — Ambulatory Visit
Admission: RE | Admit: 2017-06-01 | Discharge: 2017-06-01 | Disposition: A | Discharge status: Home / Self Care | Payer: 59 | Source: Ambulatory Visit | Attending: Physician Assistant | Admitting: Physician Assistant

## 2017-06-01 DIAGNOSIS — M4854XA Collapsed vertebra, not elsewhere classified, thoracic region, initial encounter for fracture: Secondary | ICD-10-CM | POA: Diagnosis not present

## 2017-06-01 DIAGNOSIS — M5124 Other intervertebral disc displacement, thoracic region: Secondary | ICD-10-CM | POA: Diagnosis not present

## 2017-06-01 DIAGNOSIS — S22000A Wedge compression fracture of unspecified thoracic vertebra, initial encounter for closed fracture: Secondary | ICD-10-CM | POA: Diagnosis not present

## 2017-06-03 DIAGNOSIS — M4854XD Collapsed vertebra, not elsewhere classified, thoracic region, subsequent encounter for fracture with routine healing: Secondary | ICD-10-CM | POA: Diagnosis not present

## 2017-06-23 ENCOUNTER — Telehealth: Payer: Self-pay | Admitting: Family Medicine

## 2017-06-23 DIAGNOSIS — L03115 Cellulitis of right lower limb: Secondary | ICD-10-CM | POA: Diagnosis not present

## 2017-06-23 NOTE — Telephone Encounter (Signed)
See previous phone note.  

## 2017-06-23 NOTE — Telephone Encounter (Signed)
Pt called c/o open sore on rt ankle, pt is a diabetic. Pt states that it has been like this for 2 days and that it is red all the way around the ankle. Pt states that it hurts to put pressure on his foot or ankle. Sent call to team health triage. Please advise, thank you!

## 2017-06-23 NOTE — Telephone Encounter (Signed)
Patient Name: William Frey  DOB: 20-Nov-1970    Initial Comment open sore on right ankle, red all around ankle, hurts when puts pressure on it, is diabetic    Nurse Assessment  Nurse: Raphael Gibney, RN, Vanita Ingles Date/Time (Eastern Time): 06/23/2017 2:45:44 PM  Confirm and document reason for call. If symptomatic, describe symptoms. ---Caller states he has open sore on his right ankle. Last 2-3 days, it has gotten red around ankle. has pain in his ankle. No fever. Blood sugar 120.  Does the patient have any new or worsening symptoms? ---Yes  Will a triage be completed? ---Yes  Related visit to physician within the last 2 weeks? ---No  Does the PT have any chronic conditions? (i.e. diabetes, asthma, etc.) ---Yes  List chronic conditions. ---diabetes  Is this a behavioral health or substance abuse call? ---No     Guidelines    Guideline Title Affirmed Question Affirmed Notes  Diabetes - Foot Problems and Questions [1] Ulcer, wound, blister, sore, or red area AND [2] new or increasing    Final Disposition User   See Physician within 24 Hours [place], RN, Vanita Ingles    Comments  no appts available within 24 hrs at [facility]; Pt states he has an appt with urologist tomorrow and will have him look at it. Will call back if he still needs appt.   Referrals  GO TO FACILITY REFUSED   Disagree/Comply: Disagree  Disagree/Comply Reason: Disagree with instructions

## 2017-06-23 NOTE — Telephone Encounter (Signed)
Patient has pain inside right ankle, said had a musquito bite and scratched it in his sleep X 3 days ago now very red, warm to touch advised patient that would not wait to see a urologist for this tomorrow that he should be evaluated today due to being warm to touch and so painful. Advised patient he should go to the [facility] walkin, patient staed that he would call his wife and go to the walkin clinic.FYI

## 2017-06-23 NOTE — Telephone Encounter (Signed)
Please advise 

## 2017-06-23 NOTE — Telephone Encounter (Signed)
Noted and agree. 

## 2017-06-23 NOTE — Telephone Encounter (Signed)
Can you please follow up on this call thanks,

## 2017-06-23 NOTE — Telephone Encounter (Signed)
William Frey spoke with patient going to [facility] walk in .

## 2017-06-25 ENCOUNTER — Encounter: Payer: Self-pay | Admitting: Family Medicine

## 2017-06-25 ENCOUNTER — Ambulatory Visit (INDEPENDENT_AMBULATORY_CARE_PROVIDER_SITE_OTHER): Payer: 59 | Admitting: Family Medicine

## 2017-06-25 DIAGNOSIS — L0390 Cellulitis, unspecified: Secondary | ICD-10-CM | POA: Insufficient documentation

## 2017-06-25 DIAGNOSIS — L03115 Cellulitis of right lower limb: Secondary | ICD-10-CM

## 2017-06-25 MED ORDER — DOXYCYCLINE HYCLATE 100 MG PO TABS: 100.0000 mg | ORAL_TABLET | Freq: Two times a day (BID) | ORAL | 0 refills | Status: DC

## 2017-06-25 NOTE — Assessment & Plan Note (Signed)
New problem. Stopping Bactrim and starting Doxycycline. Advised patient that if it worsens and the erythema spreads, he will need to go to the hospital for IV antibiotics.

## 2017-06-25 NOTE — Progress Notes (Signed)
Patient walked in with C/o wound to right inner ankle stating he was not sure how he received the wound, saw UC on 06/23/17 was given Bactrim and Bactroban cream , patient stated redness has become worse since the 15 th pain at an 8 when walking. Consulted with Dr. Lacinda Axon was advise to add patient to schedule.

## 2017-06-25 NOTE — Patient Instructions (Signed)
Take with food.  Be careful in the sun.  If it worsens, go to the hospital.   Take care  Dr. Lacinda Axon

## 2017-06-25 NOTE — Progress Notes (Signed)
Subjective:  Patient ID: William Frey, male    DOB: [birth year]  Age: 47 y.o. MRN: [id number]  CC: Cellulitis  HPI:  46 year old male with type 2 diabetes, hypertension, hyperlipidemia presents with the above complaint.  Patient recently developed a area of redness on his right lower extremity, at the medial ankle. He thinks he may have been bitten by a bug. The redness worsened and the area became painful. He was seen at [facility] on 8/15. He was diagnosed with cellulitis and was placed on Bactrim and topical Bactroban. He states that he's had no improvement with treatment. In fact, he feels it is worsening. No associated fever, chills. The area has developed an eschar which has just sloughed off. No drainage. No other associated symptoms. No other complaints or concerns at this time.  Social Hx   Social History   Social History  . Marital status: Married    Spouse name: N/A  . Number of children: N/A  . Years of education: N/A   Social History Main Topics  . Smoking status: Former Smoker    Packs/day: 2.00    Years: 25.00    Types: Cigarettes    Quit date: 09/17/2013  . Smokeless tobacco: Never Used  . Alcohol use No     Comment: former  . Drug use: No  . Sexual activity: Yes   Other Topics Concern  . None   Social History Narrative   ** Merged History Encounter **        Review of Systems  Constitutional: Negative.   Skin: Positive for wound.   Objective:  BP 130/82 (BP Location: Left Arm, Patient Position: Sitting, Cuff Size: Large)   Pulse 83   Temp 98.2 F (36.8 C) (Oral)   Resp 18   Wt 259 lb (117.5 kg)   SpO2 95%   BMI 33.25 kg/m   BP/Weight 06/25/2017 05/07/2017 1/95/0932  Systolic BP 671 245 809  Diastolic BP 82 86 96  Wt. (Lbs) 259 259.13 261  BMI 33.25 33.27 33.51    Physical Exam  Constitutional: He is oriented to person, place, and time. He appears well-developed. No distress.  Pulmonary/Chest: Effort normal. No respiratory distress.    Neurological: He is alert and oriented to person, place, and time.  Skin:  Right lower leg/ankle - open wound with surrounding erythema just above the medial malleolus. Warm and tender to touch.  Psychiatric: He has a normal mood and affect.  Vitals reviewed.   Lab Results  Component Value Date   WBC 6.8 04/08/2017   HGB 15.3 04/08/2017   HCT 45.0 04/08/2017   PLT 195.0 04/08/2017   GLUCOSE 129 (H) 04/08/2017   CHOL 180 04/08/2017   TRIG 273.0 (H) 04/08/2017   HDL 33.20 (L) 04/08/2017   LDLDIRECT 96.0 04/08/2017   LDLCALC 47 01/02/2014   ALT 18 04/08/2017   AST 17 04/08/2017   NA 133 (L) 04/08/2017   K 4.3 04/08/2017   CL 104 04/08/2017   CREATININE 1.09 04/08/2017   BUN 20 04/08/2017   CO2 22 04/08/2017   TSH 1.398 07/22/2010   PSA 0.54 09/18/2015   HGBA1C 7.0 (H) 04/08/2017   MICROALBUR 4.5 (H) 04/08/2017    Assessment & Plan:   Problem List Items Addressed This Visit    Cellulitis    New problem. Stopping Bactrim and starting Doxycycline. Advised patient that if it worsens and the erythema spreads, he will need to go to the hospital for IV antibiotics.  Meds ordered this encounter  Medications  . doxycycline (VIBRA-TABS) 100 MG tablet    Sig: Take 1 tablet (100 mg total) by mouth 2 (two) times daily.    Dispense:  14 tablet    Refill:  0   Follow-up: PRN  [place]

## 2017-11-05 ENCOUNTER — Other Ambulatory Visit: Payer: Self-pay | Admitting: Family Medicine

## 2017-11-05 NOTE — Telephone Encounter (Signed)
Refill sent to pharmacy.  It is okay for the patient to establish with me.  Please get him set up for an appointment.

## 2017-11-08 ENCOUNTER — Other Ambulatory Visit: Payer: Self-pay

## 2017-11-08 ENCOUNTER — Encounter: Payer: Self-pay | Admitting: Emergency Medicine

## 2017-11-08 ENCOUNTER — Telehealth: Payer: Self-pay | Admitting: Emergency Medicine

## 2017-11-08 ENCOUNTER — Emergency Department: Payer: 59

## 2017-11-08 ENCOUNTER — Emergency Department
Admission: EM | Admit: 2017-11-08 | Discharge: 2017-11-08 | Disposition: A | Discharge status: Home / Self Care | Payer: 59 | Attending: Emergency Medicine | Admitting: Emergency Medicine

## 2017-11-08 DIAGNOSIS — R079 Chest pain, unspecified: Secondary | ICD-10-CM | POA: Diagnosis not present

## 2017-11-08 DIAGNOSIS — Z5321 Procedure and treatment not carried out due to patient leaving prior to being seen by health care provider: Secondary | ICD-10-CM | POA: Insufficient documentation

## 2017-11-08 LAB — BASIC METABOLIC PANEL
ANION GAP: 9 (ref 5–15)
BUN: 21 mg/dL — ABNORMAL HIGH (ref 6–20)
CHLORIDE: 105 mmol/L (ref 101–111)
CO2: 22 mmol/L (ref 22–32)
CREATININE: 1.27 mg/dL — AB (ref 0.61–1.24)
Calcium: 9.2 mg/dL (ref 8.9–10.3)
GFR calc non Af Amer: >60 mL/min (ref >60)
Glucose, Bld: 154 mg/dL — ABNORMAL HIGH (ref 65–99)
Potassium: 3.9 mmol/L (ref 3.5–5.1)
SODIUM: 136 mmol/L (ref 135–145)

## 2017-11-08 LAB — CBC
HCT: 42.5 % (ref 40.0–52.0)
HEMOGLOBIN: 14.4 g/dL (ref 13.0–18.0)
MCH: 30.2 pg (ref 26.0–34.0)
MCHC: 33.8 g/dL (ref 32.0–36.0)
MCV: 89.4 fL (ref 80.0–100.0)
PLATELETS: 184 10*3/uL (ref 150–440)
RBC: 4.75 MIL/uL (ref 4.40–5.90)
RDW: 13.1 % (ref 11.5–14.5)
WBC: 8.5 10*3/uL (ref 3.8–10.6)

## 2017-11-08 LAB — TROPONIN I

## 2017-11-08 NOTE — ED Triage Notes (Signed)
Pt states waking up around 0200 with chest pain, pt describes pain as "cooling down feeling" that is all over; pt states he thinks anxiety, rates pain at 6/10. Pt reports no nausea, vomiting, diarrhea, lightheadedness, dizziness, nor vision changes; pt does report getting short of breath. Pt is awake, alert and oriented x4, and talking at an accelerated pace.

## 2017-11-08 NOTE — Telephone Encounter (Signed)
Called patient due to lwot to inquire about condition and follow up plans. He says he does still have tightness in his chest, but says he has had this before with work up and thinks it is anxiety.  I advised that he still needs to see a doctor for this.   Says his doctor is changing at [facility], but I advised he at least advise the office of his pain.  I also told him to return here any time.

## 2017-11-29 ENCOUNTER — Ambulatory Visit: Payer: 59 | Admitting: Family Medicine

## 2017-11-29 ENCOUNTER — Encounter: Payer: Self-pay | Admitting: Family Medicine

## 2017-11-29 VITALS — BP 130/80 | HR 83 | Temp 98.4°F | Wt 245.4 lb

## 2017-11-29 DIAGNOSIS — E119 Type 2 diabetes mellitus without complications: Secondary | ICD-10-CM | POA: Diagnosis not present

## 2017-11-29 DIAGNOSIS — E785 Hyperlipidemia, unspecified: Secondary | ICD-10-CM

## 2017-11-29 DIAGNOSIS — R079 Chest pain, unspecified: Secondary | ICD-10-CM | POA: Diagnosis not present

## 2017-11-29 DIAGNOSIS — I10 Essential (primary) hypertension: Secondary | ICD-10-CM | POA: Diagnosis not present

## 2017-11-29 DIAGNOSIS — F419 Anxiety disorder, unspecified: Secondary | ICD-10-CM

## 2017-11-29 DIAGNOSIS — E669 Obesity, unspecified: Secondary | ICD-10-CM | POA: Diagnosis not present

## 2017-11-29 DIAGNOSIS — R7989 Other specified abnormal findings of blood chemistry: Secondary | ICD-10-CM | POA: Diagnosis not present

## 2017-11-29 NOTE — Patient Instructions (Signed)
Nice to see you. We will check lab work today and contact you with the results. 

## 2017-11-30 DIAGNOSIS — F419 Anxiety disorder, unspecified: Secondary | ICD-10-CM | POA: Insufficient documentation

## 2017-11-30 DIAGNOSIS — F32A Depression, unspecified: Secondary | ICD-10-CM | POA: Insufficient documentation

## 2017-11-30 LAB — COMPREHENSIVE METABOLIC PANEL
ALT: 11 U/L (ref 0–53)
AST: 12 U/L (ref 0–37)
Albumin: 4.2 g/dL (ref 3.5–5.2)
Alkaline Phosphatase: 48 U/L (ref 39–117)
BUN: 25 mg/dL — ABNORMAL HIGH (ref 6–23)
CALCIUM: 9.6 mg/dL (ref 8.4–10.5)
CHLORIDE: 104 meq/L (ref 96–112)
CO2: 24 meq/L (ref 19–32)
Creatinine, Ser: 1.08 mg/dL (ref 0.40–1.50)
GFR: 78.42 mL/min (ref >60.00)
Glucose, Bld: 195 mg/dL — ABNORMAL HIGH (ref 70–99)
Potassium: 4.1 mEq/L (ref 3.5–5.1)
Sodium: 137 mEq/L (ref 135–145)
Total Bilirubin: 0.4 mg/dL (ref 0.2–1.2)
Total Protein: 7.1 g/dL (ref 6.0–8.3)

## 2017-11-30 LAB — LDL CHOLESTEROL, DIRECT: LDL DIRECT: 104 mg/dL

## 2017-11-30 LAB — HEMOGLOBIN A1C: Hgb A1c MFr Bld: 6.8 % — ABNORMAL HIGH (ref 4.6–6.5)

## 2017-11-30 NOTE — Assessment & Plan Note (Signed)
Offered treatment though he declined.  He will continue to monitor.

## 2017-11-30 NOTE — Assessment & Plan Note (Signed)
He will continue to follow with urology. 

## 2017-11-30 NOTE — Assessment & Plan Note (Signed)
History of this.  Has been well controlled with weight loss.  Continue to monitor.

## 2017-11-30 NOTE — Assessment & Plan Note (Signed)
Needs A1c.  Continue metformin.

## 2017-11-30 NOTE — Assessment & Plan Note (Signed)
Check direct LDL.  Discussed need for statin therapy.

## 2017-11-30 NOTE — Assessment & Plan Note (Signed)
Potentially anxiety related.  He does have risk factors for cardiac cause.  He notes extensive workup in the past that has not revealed any specific causes.  Patient does not want any further workup for this.  He believes it is anxiety related.  I offered him treatment for anxiety though he declined.  He will monitor and he was given return precautions.

## 2017-11-30 NOTE — Assessment & Plan Note (Signed)
Encouraged him to work on dietary changes further as well as exercise.

## 2017-11-30 NOTE — Progress Notes (Signed)
  Tommi Rumps, MD Phone: [phone]  William Frey is a 47 y.o. male who presents today for follow-up.  Diabetes: Last checked was 160.  That was an hour after he ate.  109 fasting prior to that.  Taking metformin.  No polyuria or polydipsia.  Occasionally gets a little dizzy if he gets hypoglycemic though he does not typically eat anything.  Does note an episode of chest pain on New Year's Eve.  Associated it with anxiety.  No exertional symptoms.  No shortness of breath with it.  No radiation with it.  Notes it felt like his anxiety.  He did go to the ED.  Lab work was negative.  EKG was reassuring.  It looks like he left before being evaluated.  He does not want cardiac evaluation.  He has changed his diet quite a bit.  He lost 55 pounds.  He has gotten back to drinking some diet drinks.  Not exercising very much though does stay active with his job.  He is seeing urology for low testosterone.  He continues on AndroGel.  Anxiety: No medications for this.  No depression.  He notes no depression.  Social History   Tobacco Use  Smoking Status Former Smoker  . Packs/day: 2.00  . Years: 25.00  . Pack years: 50.00  . Types: Cigarettes  . Last attempt to quit: 09/17/2013  . Years since quitting: 4.2  Smokeless Tobacco Never Used     ROS see history of present illness  Objective  Physical Exam Vitals:   11/29/17 1446  BP: 130/80  Pulse: 83  Temp: 98.4 F (36.9 C)  SpO2: 96%    BP Readings from Last 3 Encounters:  11/29/17 130/80  11/08/17 (!) 148/95  06/25/17 130/82   Wt Readings from Last 3 Encounters:  11/29/17 245 lb 6.4 oz (111.3 kg)  11/08/17 253 lb (114.8 kg)  06/25/17 259 lb (117.5 kg)    Physical Exam  Constitutional: No distress.  Cardiovascular: Normal rate, regular rhythm and normal heart sounds.  Pulmonary/Chest: Effort normal and breath sounds normal.  Musculoskeletal: He exhibits no edema.  Neurological: He is alert. Gait normal.  Skin:  Skin is warm and dry. He is not diaphoretic.     Assessment/Plan: Please see individual problem list.  Essential hypertension History of this.  Has been well controlled with weight loss.  Continue to monitor.  Type 2 diabetes mellitus (HCC) Needs A1c.  Continue metformin.  Hyperlipidemia Check direct LDL.  Discussed need for statin therapy.  Low testosterone He will continue to follow with urology.  Obesity (BMI 30-39.9) Encouraged him to work on dietary changes further as well as exercise.  Chest pain Potentially anxiety related.  He does have risk factors for cardiac cause.  He notes extensive workup in the past that has not revealed any specific causes.  Patient does not want any further workup for this.  He believes it is anxiety related.  I offered him treatment for anxiety though he declined.  He will monitor and he was given return precautions.  Anxiety Offered treatment though he declined.  He will continue to monitor.   Orders Placed This Encounter  Procedures  . Comp Met (CMET)  . HgB A1c  . Direct LDL    No orders of the defined types were placed in this encounter.    Tommi Rumps, MD [place]

## 2017-12-04 ENCOUNTER — Other Ambulatory Visit: Payer: Self-pay | Admitting: Family Medicine

## 2017-12-04 MED ORDER — ATORVASTATIN CALCIUM 20 MG PO TABS: 20.0000 mg | ORAL_TABLET | Freq: Every day | ORAL | 3 refills | Status: DC

## 2017-12-04 MED ORDER — METFORMIN HCL 500 MG PO TABS: ORAL_TABLET | ORAL | 1 refills | Status: DC

## 2017-12-08 NOTE — Telephone Encounter (Signed)
Pt has appt scheduled for May 30, 2018
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
## 2017-12-31 ENCOUNTER — Telehealth: Payer: Self-pay | Admitting: Radiology

## 2017-12-31 DIAGNOSIS — E785 Hyperlipidemia, unspecified: Secondary | ICD-10-CM

## 2017-12-31 NOTE — Telephone Encounter (Signed)
Pt coming in Monday for labs please place future orders. Thank you.

## 2018-01-02 NOTE — Addendum Note (Signed)
Addended by: Leone Haven on: 01/02/2018 09:58 AM   Modules accepted: Orders

## 2018-01-03 ENCOUNTER — Other Ambulatory Visit: Payer: Self-pay

## 2018-01-06 DIAGNOSIS — N138 Other obstructive and reflux uropathy: Secondary | ICD-10-CM | POA: Diagnosis not present

## 2018-01-06 DIAGNOSIS — E291 Testicular hypofunction: Secondary | ICD-10-CM | POA: Diagnosis not present

## 2018-01-06 DIAGNOSIS — Z79899 Other long term (current) drug therapy: Secondary | ICD-10-CM | POA: Diagnosis not present

## 2018-01-06 DIAGNOSIS — N401 Enlarged prostate with lower urinary tract symptoms: Secondary | ICD-10-CM | POA: Diagnosis not present

## 2018-01-10 ENCOUNTER — Other Ambulatory Visit: Payer: Self-pay

## 2018-01-10 ENCOUNTER — Telehealth: Payer: Self-pay | Admitting: Family Medicine

## 2018-01-10 MED ORDER — ATORVASTATIN CALCIUM 20 MG PO TABS: 20.0000 mg | ORAL_TABLET | Freq: Every day | ORAL | 3 refills | Status: DC

## 2018-01-10 NOTE — Telephone Encounter (Signed)
Sent to pharmacy 
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
## 2018-02-04 DIAGNOSIS — Z7984 Long term (current) use of oral hypoglycemic drugs: Secondary | ICD-10-CM | POA: Diagnosis not present

## 2018-02-04 DIAGNOSIS — Z880 Allergy status to penicillin: Secondary | ICD-10-CM | POA: Diagnosis not present

## 2018-02-04 DIAGNOSIS — Z79890 Hormone replacement therapy (postmenopausal): Secondary | ICD-10-CM | POA: Diagnosis not present

## 2018-02-04 DIAGNOSIS — N138 Other obstructive and reflux uropathy: Secondary | ICD-10-CM | POA: Diagnosis not present

## 2018-02-04 DIAGNOSIS — E119 Type 2 diabetes mellitus without complications: Secondary | ICD-10-CM | POA: Diagnosis not present

## 2018-02-04 DIAGNOSIS — E291 Testicular hypofunction: Secondary | ICD-10-CM | POA: Diagnosis not present

## 2018-02-04 DIAGNOSIS — N401 Enlarged prostate with lower urinary tract symptoms: Secondary | ICD-10-CM | POA: Diagnosis not present

## 2018-02-04 DIAGNOSIS — Z79899 Other long term (current) drug therapy: Secondary | ICD-10-CM | POA: Diagnosis not present

## 2018-02-04 DIAGNOSIS — N529 Male erectile dysfunction, unspecified: Secondary | ICD-10-CM | POA: Diagnosis not present

## 2018-02-16 ENCOUNTER — Other Ambulatory Visit: Payer: Self-pay

## 2018-04-11 ENCOUNTER — Encounter: Payer: Self-pay | Admitting: Family Medicine

## 2018-05-02 ENCOUNTER — Ambulatory Visit: Payer: 59 | Admitting: Family Medicine

## 2018-05-02 ENCOUNTER — Encounter: Payer: Self-pay | Admitting: Family Medicine

## 2018-05-02 VITALS — BP 128/86 | HR 68 | Temp 97.9°F | Resp 18 | Ht 73.0 in | Wt 253.0 lb

## 2018-05-02 DIAGNOSIS — R7989 Other specified abnormal findings of blood chemistry: Secondary | ICD-10-CM | POA: Diagnosis not present

## 2018-05-02 DIAGNOSIS — E119 Type 2 diabetes mellitus without complications: Secondary | ICD-10-CM

## 2018-05-02 DIAGNOSIS — F419 Anxiety disorder, unspecified: Secondary | ICD-10-CM | POA: Diagnosis not present

## 2018-05-02 DIAGNOSIS — E785 Hyperlipidemia, unspecified: Secondary | ICD-10-CM | POA: Diagnosis not present

## 2018-05-02 DIAGNOSIS — Z113 Encounter for screening for infections with a predominantly sexual mode of transmission: Secondary | ICD-10-CM

## 2018-05-02 DIAGNOSIS — I10 Essential (primary) hypertension: Secondary | ICD-10-CM

## 2018-05-02 NOTE — Progress Notes (Signed)
.  pcmg
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
## 2018-05-04 DIAGNOSIS — Z113 Encounter for screening for infections with a predominantly sexual mode of transmission: Secondary | ICD-10-CM | POA: Insufficient documentation
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
## 2018-05-04 NOTE — Assessment & Plan Note (Signed)
Unable to tolerate Lipitor.  Plan to check cholesterol at next visit.
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
## 2018-05-04 NOTE — Assessment & Plan Note (Signed)
Offered treatment though he declined.  He can continue to see his pastor.

## 2018-05-04 NOTE — Assessment & Plan Note (Signed)
He has been following with urology for this.  He has been on AndroGel.  His urologist is moving.  I discussed that I would prefer for him to see urology to continue his testosterone supplementation.  The patient would prefer if this medication came through our office.  Advised that I would review his prior note and we can discuss in the future.

## 2018-05-05 ENCOUNTER — Other Ambulatory Visit: Payer: Self-pay | Admitting: Family Medicine

## 2018-05-05 ENCOUNTER — Other Ambulatory Visit (INDEPENDENT_AMBULATORY_CARE_PROVIDER_SITE_OTHER): Payer: 59

## 2018-05-05 ENCOUNTER — Other Ambulatory Visit (HOSPITAL_COMMUNITY)
Admission: RE | Admit: 2018-05-05 | Discharge: 2018-05-05 | Disposition: A | Discharge status: Home / Self Care | Payer: 59 | Source: Ambulatory Visit | Attending: Family Medicine | Admitting: Family Medicine

## 2018-05-05 ENCOUNTER — Telehealth: Payer: Self-pay | Admitting: *Deleted

## 2018-05-05 DIAGNOSIS — I10 Essential (primary) hypertension: Secondary | ICD-10-CM

## 2018-05-05 DIAGNOSIS — Z1321 Encounter for screening for nutritional disorder: Secondary | ICD-10-CM

## 2018-05-05 DIAGNOSIS — E785 Hyperlipidemia, unspecified: Secondary | ICD-10-CM | POA: Diagnosis not present

## 2018-05-05 DIAGNOSIS — E559 Vitamin D deficiency, unspecified: Secondary | ICD-10-CM

## 2018-05-05 DIAGNOSIS — E119 Type 2 diabetes mellitus without complications: Secondary | ICD-10-CM

## 2018-05-05 DIAGNOSIS — Z113 Encounter for screening for infections with a predominantly sexual mode of transmission: Secondary | ICD-10-CM | POA: Diagnosis not present

## 2018-05-05 LAB — HEPATIC FUNCTION PANEL
ALK PHOS: 43 U/L (ref 39–117)
ALT: 12 U/L (ref 0–53)
AST: 12 U/L (ref 0–37)
Albumin: 3.9 g/dL (ref 3.5–5.2)
Bilirubin, Direct: 0.1 mg/dL (ref 0.0–0.3)
TOTAL PROTEIN: 7 g/dL (ref 6.0–8.3)
Total Bilirubin: 0.3 mg/dL (ref 0.2–1.2)

## 2018-05-05 LAB — COMPREHENSIVE METABOLIC PANEL
ALK PHOS: 43 U/L (ref 39–117)
ALT: 12 U/L (ref 0–53)
AST: 12 U/L (ref 0–37)
Albumin: 3.9 g/dL (ref 3.5–5.2)
BILIRUBIN TOTAL: 0.3 mg/dL (ref 0.2–1.2)
BUN: 17 mg/dL (ref 6–23)
CO2: 24 mEq/L (ref 19–32)
Calcium: 9 mg/dL (ref 8.4–10.5)
Chloride: 106 mEq/L (ref 96–112)
Creatinine, Ser: 1.12 mg/dL (ref 0.40–1.50)
GFR: 75.05 mL/min (ref >60.00)
GLUCOSE: 159 mg/dL — AB (ref 70–99)
Potassium: 4.2 mEq/L (ref 3.5–5.1)
SODIUM: 139 meq/L (ref 135–145)
TOTAL PROTEIN: 7 g/dL (ref 6.0–8.3)

## 2018-05-05 LAB — HEMOGLOBIN A1C: Hgb A1c MFr Bld: 7.3 % — ABNORMAL HIGH (ref 4.6–6.5)

## 2018-05-05 LAB — LDL CHOLESTEROL, DIRECT: Direct LDL: 91 mg/dL

## 2018-05-05 LAB — MICROALBUMIN / CREATININE URINE RATIO
Creatinine,U: 166.6 mg/dL
MICROALB/CREAT RATIO: 3.1 mg/g (ref 0.0–30.0)
Microalb, Ur: 5.1 mg/dL — ABNORMAL HIGH (ref 0.0–1.9)

## 2018-05-05 LAB — VITAMIN D 25 HYDROXY (VIT D DEFICIENCY, FRACTURES): VITD: 15.66 ng/mL — ABNORMAL LOW (ref 30.00–100.00)

## 2018-05-05 MED ORDER — METFORMIN HCL ER 500 MG PO TB24: 1000.0000 mg | ORAL_TABLET | Freq: Every day | ORAL | 1 refills | Status: DC

## 2018-05-05 MED ORDER — VITAMIN D (ERGOCALCIFEROL) 1.25 MG (50000 UNIT) PO CAPS
50000.0000 [IU] | ORAL_CAPSULE | ORAL | 0 refills | Status: DC
Start: 2018-05-05 — End: 2018-05-09

## 2018-05-05 NOTE — Addendum Note (Signed)
Addended by: Leeanne Rio on: 05/05/2018 12:01 PM   Modules accepted: Orders

## 2018-05-05 NOTE — Telephone Encounter (Signed)
Ordered

## 2018-05-05 NOTE — Telephone Encounter (Signed)
Thanks

## 2018-05-05 NOTE — Telephone Encounter (Signed)
Pt came in for labs this morning & asked if you could add on a Vitamin D. Patient states that his wife wanted him to add it since it hasn't been checked in a while.  If agreeable, please place future order.  Thanks

## 2018-05-06 LAB — HEPATITIS C ANTIBODY
Hepatitis C Ab: NONREACTIVE
SIGNAL TO CUT-OFF: 0.01 (ref <1.00)

## 2018-05-06 LAB — URINE CYTOLOGY ANCILLARY ONLY
Chlamydia: NEGATIVE
Neisseria Gonorrhea: NEGATIVE

## 2018-05-06 LAB — RPR: RPR: NONREACTIVE

## 2018-05-06 LAB — HIV ANTIBODY (ROUTINE TESTING W REFLEX): HIV 1&2 Ab, 4th Generation: NONREACTIVE

## 2018-05-09 ENCOUNTER — Telehealth: Payer: Self-pay | Admitting: Family Medicine

## 2018-05-09 ENCOUNTER — Other Ambulatory Visit: Payer: Self-pay

## 2018-05-09 MED ORDER — VITAMIN D (ERGOCALCIFEROL) 1.25 MG (50000 UNIT) PO CAPS: 50000.0000 [IU] | ORAL_CAPSULE | ORAL | 0 refills | Status: DC

## 2018-05-09 NOTE — Telephone Encounter (Signed)
Resent to [facility].

## 2018-05-09 NOTE — Telephone Encounter (Signed)
Copied from [place] [id number] [id number]. Topic: Quick Communication - Rx Refill/Question >> May 09, 2018 12:27 PM Waylan Rocher, Lumin L wrote: Medication: Vitamin D, Ergocalciferol, (DRISDOL) 50000 units CAPS capsule, metFORMIN (GLUCOPHAGE XR) 500 MG 24 hr tablet (sent to worng pharm, please resnd to pharm listed below)  Has the patient contacted their pharmacy? Yes.   (Agent: If no, request that the patient contact the pharmacy for the refill.) (Agent: If yes, when and what did the pharmacy advise?)  Preferred Pharmacy (with phone number or street name): [facility] [address] [place] [address] Phone: [phone] Fax: [phone]    Agent: Please be advised that RX refills may take up to 3 business days. We ask that you follow-up with your pharmacy.

## 2018-05-11 NOTE — Telephone Encounter (Signed)
Copied from [place] [id number]. Topic: Quick Communication - Rx Refill/Question >> May 11, 2018 10:01 AM Gardiner Ramus wrote: Medication:metFORMIN (GLUCOPHAGE XR) 500 MG 24 hr tablet still not called into the right pharmacy.   Has the patient contacted their pharmacy? Yes  Preferred Pharmacy (with phone number or street name):  [facility], [address] [place] [phone] (Phone) [phone] (Fax)  Agent: Please be advised that RX refills may take up to 3 business days. We ask that you follow-up with your pharmacy.

## 2018-05-30 ENCOUNTER — Ambulatory Visit: Payer: Self-pay | Admitting: Family Medicine

## 2018-06-01 ENCOUNTER — Encounter: Payer: Self-pay | Admitting: Family Medicine

## 2018-06-01 ENCOUNTER — Ambulatory Visit (INDEPENDENT_AMBULATORY_CARE_PROVIDER_SITE_OTHER): Payer: 59 | Admitting: Family Medicine

## 2018-06-01 DIAGNOSIS — L03114 Cellulitis of left upper limb: Secondary | ICD-10-CM

## 2018-06-01 MED ORDER — DOXYCYCLINE HYCLATE 100 MG PO TABS: 100.0000 mg | ORAL_TABLET | Freq: Two times a day (BID) | ORAL | 0 refills | Status: DC

## 2018-06-01 NOTE — Patient Instructions (Signed)
Nice to see you. We are going to treat you for cellulitis on your left elbow with doxycycline.  If this is spreading over the next couple of days you need to be evaluated while you are out of town on Friday.  This could be at an urgent care. If you develop fevers, spreading redness, or any new or changing symptoms please seek medical attention immediately.

## 2018-06-02 ENCOUNTER — Ambulatory Visit: Payer: Self-pay | Admitting: Family Medicine

## 2018-06-02 NOTE — Progress Notes (Signed)
  Tommi Rumps, MD Phone: [phone]  William Frey is a 47 y.o. male who presents today for same day visit.  CC: cellulitis  Patient notes about a week or so ago he got bitten near his left elbow by a bug and then subsequently developed a small patch of erythema over his elbow that has been spreading.  He has had no fevers.  It does not itch.  It has felt warm.  He notes a similar episode a year ago that progressed rapidly and required antibiotics.  He has a similar erythematous spot on his left lateral knee that has come and gone over the last year.  He does report a year ago he was placed on Bactrim initially though the infection continued to spread and doxycycline resolved it.  Social History   Tobacco Use  Smoking Status Former Smoker  . Packs/day: 2.00  . Years: 25.00  . Pack years: 50.00  . Types: Cigarettes  . Last attempt to quit: 09/17/2013  . Years since quitting: 4.7  Smokeless Tobacco Never Used     ROS see history of present illness  Objective  Physical Exam Vitals:   06/01/18 0942  BP: 130/82  Pulse: 65  Temp: 97.6 F (36.4 C)  SpO2: 98%    BP Readings from Last 3 Encounters:  06/01/18 130/82  05/02/18 128/86  11/29/17 130/80   Wt Readings from Last 3 Encounters:  06/01/18 253 lb 3.2 oz (114.9 kg)  05/02/18 253 lb (114.8 kg)  11/29/17 245 lb 6.4 oz (111.3 kg)    Physical Exam  Constitutional: No distress.  Cardiovascular: Normal rate, regular rhythm and normal heart sounds.  Pulmonary/Chest: Effort normal and breath sounds normal.  Musculoskeletal: He exhibits no edema.       Arms: Neurological: He is alert.  Skin: Skin is warm and dry. He is not diaphoretic.     Assessment/Plan: Please see individual problem list.  Cellulitis Suspect cellulitis given streaking.  Will trial doxycycline as he responded to that a year ago and he reports this appears similar.  He does have Bactrim at home already.  He wondered if he could take this if  he was not getting better with doxycycline.  I advised that he really should be evaluated on Friday if the area of erythema is not improving prior to starting a new antibiotic and he notes he is going out of town so that would have to be at an urgent care.  He then states that he could have his wife who is a midwife look at it and advise on bactrim. I will see him back on Monday for recheck either way given that he will be out of town.   No orders of the defined types were placed in this encounter.   Meds ordered this encounter  Medications  . doxycycline (VIBRA-TABS) 100 MG tablet    Sig: Take 1 tablet (100 mg total) by mouth 2 (two) times daily.    Dispense:  14 tablet    Refill:  0     Tommi Rumps, MD [place]

## 2018-06-02 NOTE — Assessment & Plan Note (Signed)
Suspect cellulitis given streaking.  Will trial doxycycline as he responded to that a year ago and he reports this appears similar.  He does have Bactrim at home already.  He wondered if he could take this if he was not getting better with doxycycline.  I advised that he really should be evaluated on Friday if the area of erythema is not improving prior to starting a new antibiotic and he notes he is going out of town so that would have to be at an urgent care.  He then states that he could have his wife who is a midwife look at it and advise on bactrim. I will see him back on Monday for recheck either way given that he will be out of town.

## 2018-06-06 ENCOUNTER — Ambulatory Visit: Payer: 59 | Admitting: Family Medicine

## 2018-06-06 ENCOUNTER — Encounter: Payer: Self-pay | Admitting: Family Medicine

## 2018-06-06 VITALS — BP 132/80 | HR 87 | Temp 98.4°F | Ht 73.0 in | Wt 253.4 lb

## 2018-06-06 DIAGNOSIS — L989 Disorder of the skin and subcutaneous tissue, unspecified: Secondary | ICD-10-CM | POA: Diagnosis not present

## 2018-06-06 DIAGNOSIS — L03114 Cellulitis of left upper limb: Secondary | ICD-10-CM

## 2018-06-06 NOTE — Assessment & Plan Note (Addendum)
Improved.  I do suspect he had cellulitis though he could have a more chronic underlying skin issue such as eczema or psoriasis that is contributing to these findings.  He will finish his course of doxycycline.  He will monitor for return of loose stools.  We will refer to dermatology for him to be evaluated particularly if the findings do not continue to improve.  If he develops recurrent erythema or worsening symptoms he will be reevaluated.

## 2018-06-06 NOTE — Progress Notes (Signed)
  Tommi Rumps, MD Phone: [phone]  William Frey is a 47 y.o. male who presents today for f/u.  CC: cellulitis  Patient notes the area on his left elbow started to improve after 1 to 2 days.  Notes the area on his knee has improved as well.  There is slight peeling.  He said no fevers.  He has been taking doxycycline though has missed his evening dose the last 2 days.  He notes when he had the combination of doxycycline with his metformin he had some loose stools with some nausea though he discontinued the metformin while on the doxycycline and those symptoms have improved.  No blood in his stool.  No abdominal pain.  No additional diarrhea.  He wonders if the findings are potentially related to the some cause other than cellulitis.  Social History   Tobacco Use  Smoking Status Former Smoker  . Packs/day: 2.00  . Years: 25.00  . Pack years: 50.00  . Types: Cigarettes  . Last attempt to quit: 09/17/2013  . Years since quitting: 4.7  Smokeless Tobacco Never Used     ROS see history of present illness  Objective  Physical Exam Vitals:   06/06/18 1547  BP: 132/80  Pulse: 87  Temp: 98.4 F (36.9 C)  SpO2: 97%    BP Readings from Last 3 Encounters:  06/06/18 132/80  06/01/18 130/82  05/02/18 128/86   Wt Readings from Last 3 Encounters:  06/06/18 253 lb 6.4 oz (114.9 kg)  06/01/18 253 lb 3.2 oz (114.9 kg)  05/02/18 253 lb (114.8 kg)    Physical Exam  Constitutional: No distress.  Cardiovascular: Normal rate, regular rhythm and normal heart sounds.  Pulmonary/Chest: Effort normal.  Musculoskeletal: He exhibits no edema.  Neurological: He is alert.  Skin: Skin is warm and dry. He is not diaphoretic.  Area of erythema on his left elbow has resolved, there is some peeling of the skin and dry skin in this area, area on his left knee has some peeling and dryness though no erythema     Assessment/Plan: Please see individual problem  list.  Cellulitis Improved.  I do suspect he had cellulitis though he could have a more chronic underlying skin issue such as eczema or psoriasis that is contributing to these findings.  He will finish his course of doxycycline.  He will monitor for return of loose stools.  We will refer to dermatology for him to be evaluated particularly if the findings do not continue to improve.  If he develops recurrent erythema or worsening symptoms he will be reevaluated.   Orders Placed This Encounter  Procedures  . Ambulatory referral to Dermatology    Referral Priority:   Routine    Referral Type:   Consultation    Referral Reason:   Specialty Services Required    Requested Specialty:   Dermatology    Number of Visits Requested:   1    No orders of the defined types were placed in this encounter.    Tommi Rumps, MD [place]

## 2018-06-06 NOTE — Patient Instructions (Signed)
Nice to see you. Please complete your course of doxycycline. Will refer to dermatology in case it does not continue to go away. If you have worsening or recurrent symptoms please seek medical attention immediately.

## 2018-07-14 ENCOUNTER — Ambulatory Visit (INDEPENDENT_AMBULATORY_CARE_PROVIDER_SITE_OTHER): Payer: 59 | Admitting: Internal Medicine

## 2018-07-14 ENCOUNTER — Encounter: Payer: Self-pay | Admitting: Internal Medicine

## 2018-07-14 ENCOUNTER — Telehealth: Payer: Self-pay

## 2018-07-14 ENCOUNTER — Telehealth: Payer: Self-pay | Admitting: Family Medicine

## 2018-07-14 VITALS — BP 128/84 | HR 81 | Temp 98.1°F | Resp 16 | Ht 73.0 in | Wt 254.0 lb

## 2018-07-14 DIAGNOSIS — R21 Rash and other nonspecific skin eruption: Secondary | ICD-10-CM | POA: Diagnosis not present

## 2018-07-14 DIAGNOSIS — L03116 Cellulitis of left lower limb: Secondary | ICD-10-CM

## 2018-07-14 MED ORDER — DOXYCYCLINE HYCLATE 100 MG PO TABS: 100.0000 mg | ORAL_TABLET | Freq: Two times a day (BID) | ORAL | 0 refills | Status: DC

## 2018-07-14 MED ORDER — MUPIROCIN 2 % EX OINT: 1.0000 "application " | TOPICAL_OINTMENT | Freq: Two times a day (BID) | CUTANEOUS | 1 refills | Status: DC

## 2018-07-14 NOTE — Telephone Encounter (Signed)
Ointment topically to left arm and left knee  Bid  bactroban   tMS

## 2018-07-14 NOTE — Telephone Encounter (Signed)
Copied from [place] [id number]. Topic: Quick Communication - Rx Refill/Question >> Jul 14, 2018  8:46 AM Reyne Dumas L wrote: Medication: doxycycline (VIBRA-TABS) 100 MG tablet  Has the patient contacted their pharmacy? No - skin issues are back on knees.  PT IS LEAVING TOWN THIS MORNING (Agent: If no, request that the patient contact the pharmacy for the refill.) (Agent: If yes, when and what did the pharmacy advise?)  Preferred Pharmacy (with phone number or street name): [facility], [place] [phone] (Phone) [phone] (Fax)  Agent: Please be advised that RX refills may take up to 3 business days. We ask that you follow-up with your pharmacy.

## 2018-07-14 NOTE — Telephone Encounter (Signed)
Saw pt in clinic today   [place]

## 2018-07-14 NOTE — Telephone Encounter (Signed)
Please notify patient of instructions

## 2018-07-14 NOTE — Telephone Encounter (Signed)
Patient was last seen in July 2019 and was prescribed this medication at that time, will you refill?

## 2018-07-14 NOTE — Telephone Encounter (Signed)
Call to patient-patient reports he is having another flare of skin itching and burning on elbow and knee. Patient states it itches so bad during the night that he wakes up and has scratched - he now has sore on knee and he is afraid he is going to get in trouble with cellulitis again. He is leaving town within the next hour for 4 days and he is requesting a refill of the antibiotic that Dr Caryl Bis treats this with. Per his last visit- a dermatology referral was discussed- but patient states this was not done. He is requesting this. Patient will be in [place] for 4 days and is leaving town within the next hour. His best contact is [phone]. He uses [facility]. Did look at schedule to see if he could be seen in office this morning - but nothing is available. Told patient I would send his request- would see what could be done for him.

## 2018-07-14 NOTE — Telephone Encounter (Signed)
Copied from [place] [id number]. Topic: General - Other >> Jul 14, 2018 11:47 AM Synthia Innocent wrote: CRM for notification.  Patient at pharmacy now, need clarification on directions of mupirocin ointment (BACTROBAN) 2 %  Please call [facility] out patient pharmacy

## 2018-07-14 NOTE — Progress Notes (Signed)
Chief Complaint  Patient presents with  . Rash    left  Elbow, left knee   Walk in  1. Seen 05/2018 rash left elbow and left knee red hot rash is recurrent same location though he did not complete Abx doxycycline 100 mg bid last time due to rash getting better. He reports he has been out in the woods recently and exposed to poison ivy on right lower leg but rash is back left knee and elbow. Denies insect tick bites, no new meds. Rash is not itching or burning currently but feels warm to touch. Previously given bactrim years ago for cellulitis but doesn't help appears doxycycline helps and wants refill.  He did not know about dermatology appt but it is sch with Dr. Raliegh Ip 07/20/18 advised him to f/u.   He has had 2 total episodes of this rash an dit he does not have antibiotics rash progresses   Review of Systems  Constitutional: Negative for fever.  Respiratory: Negative for shortness of breath.   Cardiovascular: Negative for chest pain.  Skin: Positive for rash. Negative for itching.   Past Medical History:  Diagnosis Date  . Allergy   . Anxiety   . Arthritis   . Asthma   . Chicken pox   . Diabetes mellitus without complication ([place])   . GERD (gastroesophageal reflux disease)   . Hyperlipidemia   . Hypertension   . Kidney stones   . Low testosterone   . Migraines    Past Surgical History:  Procedure Laterality Date  . OSTECTOMY METATARSAL Right 2003   Family History  Problem Relation Age of Onset  . Arthritis Mother   . Cancer Mother        breast and bone  . Heart Problems Mother   . Arthritis Father   . Lymphoma Father   . Heart disease Father   . Stroke Father   . Hypertension Father   . Clotting disorder Father   . Cancer Brother        lung  . Heart disease Brother        atriall fibrillation  . Diabetes Brother   . Heart disease Brother        atrial fibrillation  . Diabetes Maternal Aunt   . Diabetes Maternal Aunt    Social History   Socioeconomic History    . Marital status: Married    Spouse name: Not on file  . Number of children: Not on file  . Years of education: Not on file  . Highest education level: Not on file  Occupational History  . Not on file  Social Needs  . Financial resource strain: Not on file  . Food insecurity:    Worry: Not on file    Inability: Not on file  . Transportation needs:    Medical: Not on file    Non-medical: Not on file  Tobacco Use  . Smoking status: Former Smoker    Packs/day: 2.00    Years: 25.00    Pack years: 50.00    Types: Cigarettes    Last attempt to quit: 09/17/2013    Years since quitting: 4.8  . Smokeless tobacco: Never Used  Substance and Sexual Activity  . Alcohol use: No    Alcohol/week: 0.0 standard drinks    Comment: former  . Drug use: No  . Sexual activity: Yes  Lifestyle  . Physical activity:    Days per week: Not on file    Minutes per session:  Not on file  . Stress: Not on file  Relationships  . Social connections:    Talks on phone: Not on file    Gets together: Not on file    Attends religious service: Not on file    Active member of club or organization: Not on file    Attends meetings of clubs or organizations: Not on file    Relationship status: Not on file  . Intimate partner violence:    Fear of current or ex partner: Not on file    Emotionally abused: Not on file    Physically abused: Not on file    Forced sexual activity: Not on file  Other Topics Concern  . Not on file  Social History Narrative   ** Merged History Encounter **       Current Meds  Medication Sig  . glucose blood test strip 1 each by Other route as needed for other (true matrix glucose test strips. E11.9). Use as instructed  . Lancets (ONETOUCH ULTRASOFT) lancets Use as instructed  . metFORMIN (GLUCOPHAGE XR) 500 MG 24 hr tablet Take 2 tablets (1,000 mg total) by mouth daily with breakfast.  . Testosterone (ANDROGEL PUMP) 20.25 MG/ACT (1.62%) GEL Apply 4 Doses topically. Use 4 pumps  (2 on each shoulder) daily.   Allergies  Allergen Reactions  . Penicillins Other (See Comments)    Childhood allergy  . Penicillins Hives   Recent Results (from the past 2160 hour(s))  Urine cytology ancillary only     Status: None   Collection Time: 05/05/18 12:00 AM  Result Value Ref Range   Chlamydia Negative     Comment: Normal Reference Range - Negative   Neisseria gonorrhea Negative     Comment: Normal Reference Range - Negative  Urine Microalbumin w/creat. ratio     Status: Abnormal   Collection Time: 05/05/18  8:26 AM  Result Value Ref Range   Microalb, Ur 5.1 (H) 0.0 - 1.9 mg/dL   Creatinine,U 166.6 mg/dL   Microalb Creat Ratio 3.1 0.0 - 30.0 mg/g  Hepatitis C Antibody     Status: None   Collection Time: 05/05/18  8:26 AM  Result Value Ref Range   Hepatitis C Ab NON-REACTIVE NON-REACTI   SIGNAL TO CUT-OFF 0.01 <1.00    Comment: . HCV antibody was non-reactive. There is no laboratory  evidence of HCV infection. . In most cases, no further action is required. However, if recent HCV exposure is suspected, a test for HCV RNA (test code [phone]) is suggested. . For additional information please refer to http://education.questdiagnostics.com/faq/FAQ22v1 (This link is being provided for informational/ educational purposes only.) .   RPR     Status: None   Collection Time: 05/05/18  8:26 AM  Result Value Ref Range   RPR Ser Ql NON-REACTIVE NON-REACTI  HIV antibody (with reflex)     Status: None   Collection Time: 05/05/18  8:26 AM  Result Value Ref Range   HIV 1&2 Ab, 4th Generation NON-REACTIVE NON-REACTI    Comment: HIV-1 antigen and HIV-1/HIV-2 antibodies were not detected. There is no laboratory evidence of HIV infection. Marland Kitchen PLEASE NOTE: This information has been disclosed to you from records whose confidentiality may be protected by state law.  If your state requires such protection, then the state law prohibits you from making any further disclosure of the  information without the specific written consent of the person to whom it pertains, or as otherwise permitted by law. A general authorization for the  release of medical or other information is NOT sufficient for this purpose. . For additional information please refer to http://education.questdiagnostics.com/faq/FAQ106 (This link is being provided for informational/ educational purposes only.) . Marland Kitchen The performance of this assay has not been clinically validated in patients less than 62 years old. .   Comp Met (CMET)     Status: Abnormal   Collection Time: 05/05/18  8:26 AM  Result Value Ref Range   Sodium 139 135 - 145 mEq/L   Potassium 4.2 3.5 - 5.1 mEq/L   Chloride 106 96 - 112 mEq/L   CO2 24 19 - 32 mEq/L   Glucose, Bld 159 (H) 70 - 99 mg/dL   BUN 17 6 - 23 mg/dL   Creatinine, Ser 1.12 0.40 - 1.50 mg/dL   Total Bilirubin 0.3 0.2 - 1.2 mg/dL   Alkaline Phosphatase 43 39 - 117 U/L   AST 12 0 - 37 U/L   ALT 12 0 - 53 U/L   Total Protein 7.0 6.0 - 8.3 g/dL   Albumin 3.9 3.5 - 5.2 g/dL   Calcium 9.0 8.4 - 10.5 mg/dL   GFR 75.05 >60.00 mL/min  HgB A1c     Status: Abnormal   Collection Time: 05/05/18  8:26 AM  Result Value Ref Range   Hgb A1c MFr Bld 7.3 (H) 4.6 - 6.5 %    Comment: Glycemic Control Guidelines for People with Diabetes:Non Diabetic:  <6%Goal of Therapy: <7%Additional Action Suggested:  >8%   Hepatic function panel     Status: None   Collection Time: 05/05/18  8:26 AM  Result Value Ref Range   Total Bilirubin 0.3 0.2 - 1.2 mg/dL   Bilirubin, Direct 0.1 0.0 - 0.3 mg/dL   Alkaline Phosphatase 43 39 - 117 U/L   AST 12 0 - 37 U/L   ALT 12 0 - 53 U/L   Total Protein 7.0 6.0 - 8.3 g/dL   Albumin 3.9 3.5 - 5.2 g/dL  LDL cholesterol, direct     Status: None   Collection Time: 05/05/18  8:26 AM  Result Value Ref Range   Direct LDL 91.0 mg/dL    Comment: Optimal:  <100 mg/dLNear or Above Optimal:  100-129 mg/dLBorderline High:  130-159 mg/dLHigh:  160-189 mg/dLVery  High:  >190 mg/dL  Vitamin D (25 hydroxy)     Status: Abnormal   Collection Time: 05/05/18 12:01 PM  Result Value Ref Range   VITD 15.66 (L) 30.00 - 100.00 ng/mL   Objective  Body mass index is 33.51 kg/m. Wt Readings from Last 3 Encounters:  07/14/18 254 lb (115.2 kg)  06/06/18 253 lb 6.4 oz (114.9 kg)  06/01/18 253 lb 3.2 oz (114.9 kg)   Temp Readings from Last 3 Encounters:  07/14/18 98.1 F (36.7 C) (Oral)  06/06/18 98.4 F (36.9 C) (Oral)  06/01/18 97.6 F (36.4 C) (Oral)   BP Readings from Last 3 Encounters:  07/14/18 128/84  06/06/18 132/80  06/01/18 130/82   Pulse Readings from Last 3 Encounters:  07/14/18 81  06/06/18 87  06/01/18 65    Physical Exam  Constitutional: He is oriented to person, place, and time. Vital signs are normal. He appears well-developed and well-nourished. He is cooperative.  HENT:  Head: Normocephalic and atraumatic.  Mouth/Throat: Oropharynx is clear and moist and mucous membranes are normal.  Eyes: Pupils are equal, round, and reactive to light. Conjunctivae are normal.  Cardiovascular: Normal rate, regular rhythm and normal heart sounds.  Pulmonary/Chest: Effort normal and breath  sounds normal.  Neurological: He is alert and oriented to person, place, and time. Gait normal.  Skin: Skin is warm and dry. Rash noted. There is erythema.     Psychiatric: He has a normal mood and affect. His speech is normal and behavior is normal. Judgment and thought content normal. Cognition and memory are normal.  Nursing note and vitals reviewed.   Assessment   1. C/w cellulitis vs psoriasis vs other L elbow/knee, recurrent Plan  1. Doxy bid x 1 week  bactroban  F/u dermatology Dr. Raliegh Ip 07/20/18 further assessment   F/u with PCP in 3 months sooner if needed   Provider: Dr. Olivia Mackie McLean-[facility]

## 2018-07-14 NOTE — Patient Instructions (Addendum)
F/u with dermatology   Psoriasis Psoriasis is a long-term (chronic) condition of skin inflammation. It occurs because your immune system causes skin cells to form too quickly. As a result, too many skin cells grow and create raised, red patches (plaques) that look silvery on your skin. Plaques may appear anywhere on your body. They can be any size or shape. Psoriasis can come and go. The condition varies from mild to very severe. It cannot be passed from one person to another (not contagious). What are the causes? The cause of psoriasis is not known, but certain factors can make the condition worse. These include:  Damage or trauma to the skin, such as cuts, scrapes, sunburn, and dryness.  Lack of sunlight.  Certain medicines.  Alcohol.  Tobacco use.  Stress.  Infections caused by bacteria or viruses.  What increases the risk? This condition is more likely to develop in:  People with a family history of psoriasis.  People who are Caucasian.  People who are between the ages of 15-25 and 63-74 years old.  What are the signs or symptoms? There are five different types of psoriasis. You can have more than one type of psoriasis during your life. Types are:  Plaque.  Guttate.  Inverse.  Pustular.  Erythrodermic.  Each type of psoriasis has different symptoms.  Plaque psoriasis symptoms include red, raised plaques with a silvery white coating (scale). These plaques may be itchy. Your nails may be pitted and crumbly or fall off.  Guttate psoriasis symptoms include small red spots that often show up on your trunk, arms, and legs. These spots may develop after you have been sick, especially with strep throat.  Inverse psoriasis symptoms include plaques in your underarm area, under your breasts, or on your genitals, groin, or buttocks.  Pustular psoriasis symptoms include pus-filled bumps that are painful, red, and swollen on the palms of your hands or the soles of your feet.  You also may feel exhausted, feverish, weak, or have no appetite.  Erythrodermic psoriasis symptoms include bright red skin that may look burned. You may have a fast heartbeat and a body temperature that is too high or too low. You may be itchy or in pain.  How is this diagnosed? Your health care provider may suspect psoriasis based on your symptoms and family history. Your health care provider will also do a physical exam. This may include a procedure to remove a tissue sample (biopsy) for testing. You may also be referred to a health care provider who specializes in skin diseases (dermatologist). How is this treated? There is no cure for this condition, but treatment can help manage it. Goals of treatment include:  Helping your skin heal.  Reducing itching and inflammation.  Slowing the growth of new skin cells.  Helping your immune system respond better to your skin.  Treatment varies, depending on the severity of your condition. Treatment may include:  Creams or ointments.  Ultraviolet ray exposure (light therapy). This may include natural sunlight or light therapy in a medical office.  Medicines (systemic therapy). These medicines can help your body better manage skin cell turnover and inflammation. They may be used along with light therapy or ointments. You may also get antibiotic medicines if you have an infection.  Follow these instructions at home: [place] your skin as needed. Only use moisturizers that have been approved by your health care provider.  Apply cool compresses to the affected areas.  Do not scratch your skin.  Lifestyle   Do not use tobacco products. This includes cigarettes, chewing tobacco, and e-cigarettes. If you need help quitting, ask your health care provider.  Drink little or no alcohol.  Try techniques for stress reduction, such as meditation or yoga.  Get exposure to the sun as told by your health care provider. Do not get  sunburned.  Consider joining a psoriasis support group. Medicines  Take or use over-the-counter and prescription medicines only as told by your health care provider.  If you were prescribed an antibiotic, take or use it as told by your health care provider. Do not stop taking the antibiotic even if your condition starts to improve. General instructions  Keep a journal to help track what triggers an outbreak. Try to avoid any triggers.  See a counselor or social worker if feelings of sadness, frustration, and hopelessness about your condition are interfering with your work and relationships.  Keep all follow-up visits as told by your health care provider. This is important. Contact a health care provider if:  Your pain gets worse.  You have increasing redness or warmth in the affected areas.  You have new or worsening pain or stiffness in your joints.  Your nails start to break easily or pull away from the nail bed.  You have a fever.  You feel depressed. This information is not intended to replace advice given to you by your health care provider. Make sure you discuss any questions you have with your health care provider. Document Released: 10/23/2000 Document Revised: 04/02/2016 Document Reviewed: 03/13/2015 Elsevier Interactive Patient Education  2018 [place].  Cellulitis, Adult Cellulitis is a skin infection. The infected area is usually red and tender. This condition occurs most often in the arms and lower legs. The infection can travel to the muscles, blood, and underlying tissue and become serious. It is very important to get treated for this condition. What are the causes? Cellulitis is caused by bacteria. The bacteria enter through a break in the skin, such as a cut, burn, insect bite, open sore, or crack. What increases the risk? This condition is more likely to occur in people who:  Have a weak defense system (immune system).  Have open wounds on the skin such  as cuts, burns, bites, and scrapes. Bacteria can enter the body through these open wounds.  Are older.  Have diabetes.  Have a type of long-lasting (chronic) liver disease (cirrhosis) or kidney disease.  Use IV drugs.  What are the signs or symptoms? Symptoms of this condition include:  Redness, streaking, or spotting on the skin.  Swollen area of the skin.  Tenderness or pain when an area of the skin is touched.  Warm skin.  Fever.  Chills.  Blisters.  How is this diagnosed? This condition is diagnosed based on a medical history and physical exam. You may also have tests, including:  Blood tests.  Lab tests.  Imaging tests.  How is this treated? Treatment for this condition may include:  Medicines, such as antibiotic medicines or antihistamines.  Supportive care, such as rest and application of cold or warm cloths (cold or warm compresses) to the skin.  Hospital care, if the condition is severe.  The infection usually gets better within 1-2 days of treatment. Follow these instructions at home:  Take over-the-counter and prescription medicines only as told by your health care provider.  If you were prescribed an antibiotic medicine, take it as told by your health care provider. Do not stop  taking the antibiotic even if you start to feel better.  Drink enough fluid to keep your urine clear or pale yellow.  Do not touch or rub the infected area.  Raise (elevate) the infected area above the level of your heart while you are sitting or lying down.  Apply warm or cold compresses to the affected area as told by your health care provider.  Keep all follow-up visits as told by your health care provider. This is important. These visits let your health care provider make sure a more serious infection is not developing. Contact a health care provider if:  You have a fever.  Your symptoms do not improve within 1-2 days of starting treatment.  Your bone or joint  underneath the infected area becomes painful after the skin has healed.  Your infection returns in the same area or another area.  You notice a swollen bump in the infected area.  You develop new symptoms.  You have a general ill feeling (malaise) with muscle aches and pains. Get help right away if:  Your symptoms get worse.  You feel very sleepy.  You develop vomiting or diarrhea that persists.  You notice red streaks coming from the infected area.  Your red area gets larger or turns dark in color. This information is not intended to replace advice given to you by your health care provider. Make sure you discuss any questions you have with your health care provider. Document Released: 08/05/2005 Document Revised: 03/05/2016 Document Reviewed: 09/04/2015 Elsevier Interactive Patient Education  [employer].

## 2018-07-19 NOTE — Telephone Encounter (Signed)
Mychart message has beeen sent to inform patient.

## 2018-07-20 DIAGNOSIS — R21 Rash and other nonspecific skin eruption: Secondary | ICD-10-CM | POA: Diagnosis not present

## 2018-07-20 DIAGNOSIS — L308 Other specified dermatitis: Secondary | ICD-10-CM | POA: Diagnosis not present

## 2018-07-20 DIAGNOSIS — L259 Unspecified contact dermatitis, unspecified cause: Secondary | ICD-10-CM | POA: Diagnosis not present

## 2018-07-22 DIAGNOSIS — R21 Rash and other nonspecific skin eruption: Secondary | ICD-10-CM | POA: Diagnosis not present

## 2018-07-27 DIAGNOSIS — L239 Allergic contact dermatitis, unspecified cause: Secondary | ICD-10-CM | POA: Diagnosis not present

## 2018-09-28 NOTE — Progress Notes (Signed)
Subjective:    Patient ID: William Frey, male    DOB: 22-Aug-1971, 47 y.o.   MRN: [id number]  HPI   Presents to clinic c/o right kidney pain, blood in urine and suspects he may have kidney stone.  Patient states he has noticed any blood in urine off and on for 4 months.  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  Patient states he used to see a urologist, but his urology has left the area and he needs any referral.  Denies any pain with urination, just notices the blood in urine and discolored urine.  Patient does report a strong history of kidney stones, states he has had 11 kidney stones throughout his life.  Patient believes that kidney stones could be part of what is causing the blood in his urine and his right-sided low back pain.  Denies fever or chills.  Denies nausea, vomiting or diarrhea.  Patient also request his A1c be checked today.  Patient Active Problem List   Diagnosis Date Noted  . Screen for STD (sexually transmitted disease) 05/04/2018  . Anxiety 11/30/2017  . Cellulitis 06/25/2017  . Chest pain 11/15/2016  . Essential hypertension 11/15/2016  . Hyperlipidemia 12/23/2015  . Type 2 diabetes mellitus ([place]) 09/23/2015  . Low testosterone 09/23/2015  . Preventative health care 09/18/2015  . Obesity (BMI 30-39.9) 09/18/2015   Social History   Tobacco Use  . Smoking status: Former Smoker    Packs/day: 2.00    Years: 25.00    Pack years: 50.00    Types: Cigarettes    Last attempt to quit: 09/17/2013    Years since quitting: 5.0  . Smokeless tobacco: Never Used  Substance Use Topics  . Alcohol use: No    Alcohol/week: 0.0 standard drinks    Comment: former   Review of Systems  Constitutional: Negative for chills,  fatigue and fever.  HENT: Negative for congestion, ear pain, sinus pain and sore throat.   Eyes: Negative.   Respiratory: Negative for cough, shortness of breath and wheezing.   Cardiovascular: Negative for chest pain, palpitations and leg swelling.  Gastrointestinal: Negative for abdominal pain, diarrhea, nausea and vomiting.  Genitourinary: +blood in urine, right flank pain, tea colored urine Musculoskeletal: Negative for arthralgias and myalgias.  Skin: Negative for color change, pallor and rash.  Neurological: Negative for syncope, light-headedness and headaches.  Psychiatric/Behavioral: The patient is not nervous/anxious.       Objective:   Physical Exam  Constitutional: He is oriented to person, place, and time. No distress.  HENT:  Head: Normocephalic and atraumatic.  Eyes: Conjunctivae and EOM are normal. No scleral icterus.  Neck: Neck supple. No tracheal deviation present.  Cardiovascular: Normal rate and regular rhythm.  Pulmonary/Chest: Effort normal and breath sounds normal. No respiratory distress.  Abdominal: Soft. Bowel sounds are normal. He exhibits no distension and no mass. There is no tenderness. There is no rebound and no guarding.  Positive right flank tenderness with palpation  Musculoskeletal: Normal range of motion. He exhibits no edema.  Neurological: He is alert and oriented to person, place, and time.  Skin: Skin is warm and dry. He is not diaphoretic. No pallor.  Psychiatric: He  has a normal mood and affect. His behavior is normal.  Nursing note and vitals reviewed.     Vitals:   09/29/18 0833  BP: 122/82  Pulse: 67  Temp: 97.7 F (36.5 C)  SpO2: 97%   Assessment & Plan:   Gross hematuria, right flank pain, history of kidney stones -  due to gross hematuria we will send urine for urine culture and also microscopic analysis to lab.  Patient will take ciprofloxacin 500 mg twice daily for 10 days and we will get a urgent urology referral.  Patient  will begin taking Flomax 0.4 mg once daily, and will also use Tylenol No. 3 as needed for pain.  If it is a kidney stone, the Flomax should help expand vessels to allow passage.  We will also get CT scan to rule out renal stones as well as look for other etiology that could explain patient's blood in urine/right flank pain.  Differential diagnosis of patient's flank pain/hematuria could be UTI, prostatitis. Patient will get lab work today including CBC, CMP, PSA.  Type 2 diabetes-new A1c drawn with lab work today per patient request.  Keep regularly scheduled follow-up here as planned.  He will be contacted in regards to setting of CT scan & also urology referral appointment.

## 2018-09-29 ENCOUNTER — Ambulatory Visit: Payer: 59 | Admitting: Family Medicine

## 2018-09-29 ENCOUNTER — Encounter: Payer: Self-pay | Admitting: Family Medicine

## 2018-09-29 VITALS — BP 122/82 | HR 67 | Temp 97.7°F | Ht 74.0 in | Wt 261.0 lb

## 2018-09-29 DIAGNOSIS — R109 Unspecified abdominal pain: Secondary | ICD-10-CM | POA: Diagnosis not present

## 2018-09-29 DIAGNOSIS — R310 Gross hematuria: Secondary | ICD-10-CM | POA: Diagnosis not present

## 2018-09-29 DIAGNOSIS — E119 Type 2 diabetes mellitus without complications: Secondary | ICD-10-CM | POA: Diagnosis not present

## 2018-09-29 DIAGNOSIS — Z87442 Personal history of urinary calculi: Secondary | ICD-10-CM

## 2018-09-29 LAB — CBC WITH DIFFERENTIAL/PLATELET
Basophils Absolute: 0.1 K/uL (ref 0.0–0.1)
Basophils Relative: 1.2 % (ref 0.0–3.0)
Eosinophils Absolute: 0.1 K/uL (ref 0.0–0.7)
Eosinophils Relative: 1.8 % (ref 0.0–5.0)
HCT: 41.3 % (ref 39.0–52.0)
Hemoglobin: 14.2 g/dL (ref 13.0–17.0)
Lymphocytes Relative: 50.6 % — ABNORMAL HIGH (ref 12.0–46.0)
Lymphs Abs: 2.9 K/uL (ref 0.7–4.0)
MCHC: 34.3 g/dL (ref 30.0–36.0)
MCV: 89.9 fl (ref 78.0–100.0)
Monocytes Absolute: 0.5 K/uL (ref 0.1–1.0)
Monocytes Relative: 8 % (ref 3.0–12.0)
Neutro Abs: 2.2 K/uL (ref 1.4–7.7)
Neutrophils Relative %: 38.4 % — ABNORMAL LOW (ref 43.0–77.0)
Platelets: 197 K/uL (ref 150.0–400.0)
RBC: 4.6 Mil/uL (ref 4.22–5.81)
RDW: 12.9 % (ref 11.5–15.5)
WBC: 5.7 K/uL (ref 4.0–10.5)

## 2018-09-29 LAB — COMPREHENSIVE METABOLIC PANEL WITH GFR
ALT: 14 U/L (ref 0–53)
AST: 12 U/L (ref 0–37)
Albumin: 4 g/dL (ref 3.5–5.2)
Alkaline Phosphatase: 49 U/L (ref 39–117)
BUN: 18 mg/dL (ref 6–23)
CO2: 24 meq/L (ref 19–32)
Calcium: 9.1 mg/dL (ref 8.4–10.5)
Chloride: 105 meq/L (ref 96–112)
Creatinine, Ser: 1.13 mg/dL (ref 0.40–1.50)
GFR: 74.15 mL/min (ref >60.00)
Glucose, Bld: 145 mg/dL — ABNORMAL HIGH (ref 70–99)
Potassium: 4.2 meq/L (ref 3.5–5.1)
Sodium: 138 meq/L (ref 135–145)
Total Bilirubin: 0.5 mg/dL (ref 0.2–1.2)
Total Protein: 7.2 g/dL (ref 6.0–8.3)

## 2018-09-29 LAB — URINALYSIS, ROUTINE W REFLEX MICROSCOPIC
BILIRUBIN URINE: NEGATIVE
Ketones, ur: NEGATIVE
LEUKOCYTES UA: NEGATIVE
Nitrite: NEGATIVE
PH: 5 (ref 5.0–8.0)
Specific Gravity, Urine: >=1.03 — AB (ref 1.000–1.030)
Total Protein, Urine: NEGATIVE
Urine Glucose: NEGATIVE
Urobilinogen, UA: 0.2 (ref 0.0–1.0)

## 2018-09-29 LAB — POCT URINALYSIS DIPSTICK
Bilirubin, UA: NEGATIVE
Glucose, UA: NEGATIVE
KETONES UA: NEGATIVE
Leukocytes, UA: NEGATIVE
NITRITE UA: NEGATIVE
PH UA: 5.5 (ref 5.0–8.0)
PROTEIN UA: POSITIVE — AB
Spec Grav, UA: >=1.03 — AB (ref 1.010–1.025)
Urobilinogen, UA: 0.2 E.U./dL

## 2018-09-29 LAB — HEMOGLOBIN A1C: Hgb A1c MFr Bld: 7.6 % — ABNORMAL HIGH (ref 4.6–6.5)

## 2018-09-29 MED ORDER — CIPROFLOXACIN HCL 500 MG PO TABS: 500.0000 mg | ORAL_TABLET | Freq: Two times a day (BID) | ORAL | 0 refills | Status: DC

## 2018-09-29 MED ORDER — ACETAMINOPHEN-CODEINE #3 300-30 MG PO TABS: 1.0000 | ORAL_TABLET | Freq: Four times a day (QID) | ORAL | 0 refills | Status: DC | PRN

## 2018-09-29 MED ORDER — TAMSULOSIN HCL 0.4 MG PO CAPS: 0.4000 mg | ORAL_CAPSULE | Freq: Every day | ORAL | 3 refills | Status: DC

## 2018-09-30 ENCOUNTER — Telehealth: Payer: Self-pay | Admitting: Family Medicine

## 2018-09-30 LAB — PSA, TOTAL AND FREE
PSA, % Free: 71 % (calc) (ref >25)
PSA, Free: 0.5 ng/mL
PSA, Total: 0.7 ng/mL (ref <=4.0)

## 2018-09-30 NOTE — Telephone Encounter (Signed)
I called patient to make sure he knew results were released. He asked if I would review them with him. He states that urine is still burgundy off & on. He stated that after riding his tractor last night his urine was once again very dark in color. He hopes that CT scan will reveal more because he doesn't understand results.

## 2018-09-30 NOTE — Telephone Encounter (Signed)
Results are released to his my chart account

## 2018-09-30 NOTE — Telephone Encounter (Signed)
Pt requesting lab results. cb [phone]

## 2018-09-30 NOTE — Telephone Encounter (Signed)
We are having him get the CT scan to look at kidneys and other Abdominal organs. I also placed a urology referral at his office visit, his last urologist left the area.  I am not exactly sure why he is having blood in urine, this is why we are doing the scan and having him see urology

## 2018-10-01 LAB — URINE CULTURE
MICRO NUMBER: 91405063
Result:: NO GROWTH
SPECIMEN QUALITY:: ADEQUATE

## 2018-10-03 ENCOUNTER — Ambulatory Visit
Admission: RE | Admit: 2018-10-03 | Discharge: 2018-10-03 | Disposition: A | Discharge status: Home / Self Care | Payer: 59 | Source: Ambulatory Visit | Attending: Family Medicine | Admitting: Family Medicine

## 2018-10-03 DIAGNOSIS — R109 Unspecified abdominal pain: Secondary | ICD-10-CM | POA: Diagnosis not present

## 2018-10-03 DIAGNOSIS — R310 Gross hematuria: Secondary | ICD-10-CM | POA: Diagnosis not present

## 2018-10-03 DIAGNOSIS — N200 Calculus of kidney: Secondary | ICD-10-CM | POA: Diagnosis not present

## 2018-10-04 ENCOUNTER — Telehealth: Payer: Self-pay | Admitting: Family Medicine

## 2018-10-04 DIAGNOSIS — N200 Calculus of kidney: Secondary | ICD-10-CM

## 2018-10-04 DIAGNOSIS — R109 Unspecified abdominal pain: Secondary | ICD-10-CM

## 2018-10-04 MED ORDER — HYDROCODONE-ACETAMINOPHEN 5-325 MG PO TABS: 1.0000 | ORAL_TABLET | Freq: Three times a day (TID) | ORAL | 0 refills | Status: DC | PRN

## 2018-10-04 NOTE — Telephone Encounter (Signed)
Copied from [place] [id number]. Topic: Quick Communication - See Telephone Encounter >> Oct 04, 2018  3:50 PM Vernona Rieger wrote: CRM for notification. See Telephone encounter for: 10/04/18.  Patient said he seen his CT results in mychart and has some questions for the nurse. Please contact pt.

## 2018-10-04 NOTE — Telephone Encounter (Signed)
Please advise 

## 2018-10-05 ENCOUNTER — Telehealth: Payer: Self-pay | Admitting: Urology

## 2018-10-05 NOTE — Telephone Encounter (Signed)
Pain med sent yesterday

## 2018-10-05 NOTE — Telephone Encounter (Signed)
Pt PCP office called office stating the pt CT reveals a 108mm kidney stone and many other small stones. PCP wants to know if pt can be seen earlier than his 12/13 new pt appt?  Please advise.

## 2018-10-05 NOTE — Telephone Encounter (Signed)
His stones are nonobstructing.  If we have no slots before 12/13 would place on a cancellation list with any provider.

## 2018-10-05 NOTE — Telephone Encounter (Signed)
Called and spoke with patient. Pt stated that he has already spoke to someone about his lab results or questions about his CT scan.   Pt stated that he has not heard anything about this and he seemed up set since this was place as an urgent referral.   Sent to [employer]

## 2018-10-11 ENCOUNTER — Ambulatory Visit: Payer: Self-pay

## 2018-10-11 ENCOUNTER — Ambulatory Visit (INDEPENDENT_AMBULATORY_CARE_PROVIDER_SITE_OTHER): Payer: 59

## 2018-10-11 ENCOUNTER — Ambulatory Visit: Payer: 59 | Admitting: Family Medicine

## 2018-10-11 VITALS — BP 128/80 | HR 88 | Temp 97.4°F | Ht 74.0 in | Wt 258.4 lb

## 2018-10-11 DIAGNOSIS — R0989 Other specified symptoms and signs involving the circulatory and respiratory systems: Secondary | ICD-10-CM

## 2018-10-11 DIAGNOSIS — R109 Unspecified abdominal pain: Secondary | ICD-10-CM

## 2018-10-11 DIAGNOSIS — R059 Cough, unspecified: Secondary | ICD-10-CM

## 2018-10-11 DIAGNOSIS — R05 Cough: Secondary | ICD-10-CM | POA: Diagnosis not present

## 2018-10-11 DIAGNOSIS — N200 Calculus of kidney: Secondary | ICD-10-CM | POA: Diagnosis not present

## 2018-10-11 MED ORDER — BENZONATATE 100 MG PO CAPS: 100.0000 mg | ORAL_CAPSULE | Freq: Three times a day (TID) | ORAL | 0 refills | Status: DC | PRN

## 2018-10-11 MED ORDER — PREDNISONE 10 MG PO TABS: 10.0000 mg | ORAL_TABLET | Freq: Every day | ORAL | 0 refills | Status: DC

## 2018-10-11 MED ORDER — HYDROCODONE-ACETAMINOPHEN 5-325 MG PO TABS: 1.0000 | ORAL_TABLET | Freq: Three times a day (TID) | ORAL | 0 refills | Status: DC | PRN

## 2018-10-11 NOTE — Progress Notes (Signed)
Subjective:    Patient ID: William Frey, male    DOB: 04-14-1971, 47 y.o.   MRN: [id number]  HPI  Presents to clinic c/o cough, chest congestion for about a week.  States his wife was sick with similar symptoms as well.  Patient states he does cough up some mucus that is white/clear, a few times it was green.  Patient states he feels really rundown.  States he has been using NyQuil to help him get some sleep at night and calm symptoms and using DayQuil during the day.  Patient also aware that he has a urology appointment planned for 10/21/2018 due to his kidney stone.  Upon discussing this, patient states he never took the ciprofloxacin that was prescribed to him at last visit to cover any possible development of urinary tract/kidney infection.  Patient states the blood in urine comes and goes, sometimes he will urinate a tea colored urine and other times it seems more yellow.  Patient states he also has used the Vicodin on occasion for pain, which has been helpful but would like a refill to help him get through until he sees urology.  Patient Active Problem List   Diagnosis Date Noted  . Screen for STD (sexually transmitted disease) 05/04/2018  . Anxiety 11/30/2017  . Cellulitis 06/25/2017  . Chest pain 11/15/2016  . Essential hypertension 11/15/2016  . Hyperlipidemia 12/23/2015  . Type 2 diabetes mellitus ([place]) 09/23/2015  . Low testosterone 09/23/2015  . Preventative health care 09/18/2015  . Obesity (BMI 30-39.9) 09/18/2015   Social History   Tobacco Use  . Smoking status: Former Smoker    Packs/day: 2.00    Years: 25.00    Pack years: 50.00    Types: Cigarettes    Last attempt to quit: 09/17/2013    Years since quitting: 5.0  . Smokeless tobacco: Never Used  Substance Use Topics  . Alcohol use: No    Alcohol/week: 0.0 standard drinks    Comment: former   Review of Systems  Constitutional: +fatigue. Negative for chills, and fever.  HENT: Negative for congestion,  ear pain, sinus pain and sore throat.   Eyes: Negative.   Respiratory: +cough and chest congestion. Negative for shortness of breath and wheezing.   Cardiovascular: Negative for chest pain, palpitations and leg swelling.  Gastrointestinal: Negative for abdominal pain, diarrhea, nausea and vomiting.  Genitourinary: Negative for dysuria, frequency and urgency.  Musculoskeletal: Negative for arthralgias and myalgias.  Skin: Negative for color change, pallor and rash.  Neurological: Negative for syncope, light-headedness and headaches.  Psychiatric/Behavioral: The patient is not nervous/anxious.       Objective:   Physical Exam  Constitutional: He is oriented to person, place, and time. He appears well-nourished. No distress.  HENT:  Head: Normocephalic and atraumatic.  Eyes: Conjunctivae and EOM are normal. No scleral icterus.  Neck: Neck supple. No tracheal deviation present.  Cardiovascular: Normal rate, regular rhythm and normal heart sounds.  Pulmonary/Chest: Effort normal. No stridor. No respiratory distress. He has no rales.  Some scattered upper respiratory congestion  Abdominal: Soft. Bowel sounds are normal. He exhibits no distension.  Some right sided cva tenderness.   Musculoskeletal: He exhibits no edema.  Neurological: He is alert and oriented to person, place, and time.  Skin: Skin is warm and dry. No pallor.  Psychiatric: He has a normal mood and affect. His behavior is normal.  Nursing note and vitals reviewed.     Vitals:   10/11/18 1558  BP:  128/80  Pulse: 88  Temp: (!) 97.4 F (36.3 C)  SpO2: 97%   Assessment & Plan:   Cough/chest congestion-patient would really like a chest x-ray, he feels like his lungs are full of phlegm.  We will get chest x-ray in clinic today.  Patient also will take Tessalon Perls as needed for cough and do steroid burst.  I offered prescription for albuterol inhaler to help open up lungs, but patient declines states he does not feel he  needs an inhaler. Patient advised that I feel his respiratory symptoms are most likely a viral URI.   Right flank pain/renal stones-patient strongly encouraged to take the ciprofloxacin that was prescribed to him at last visit to prevent any development of urinary tract/kidney infection due to his kidney stones.  Patient states he was weary of taking the antibiotic because he does not like taking pills.  Patient also given a refill of 10 tablets of hydrocodone per his request to help control his pain while waiting for urology appointment.  [facility] [place] PMP registry checked and is appropriate for refill 10 tablets of hydrocodone.  [facility] PMP registry checked and is appropriate for refill of 10 tablets of hydrocodone.  Patient will keep regularly scheduled follow-up with PCP as planned.  He also will keep upcoming urology appointment on 13 December as planned.

## 2018-10-11 NOTE — Patient Instructions (Signed)
Please begin ciprofloxacin.  Prednisone and Tessalon Perles sent to pharmacy.  Refill of pain medications in 10 to get went to urology appointment.

## 2018-10-11 NOTE — Telephone Encounter (Signed)
Pt. Called to report chest congestion, runny nose and pressure in chest.  Stated the chest congestion started about one week ago.  Reported he felt okay for 3-4 days, then started feeling worse the past 2 days. C/o wheezing, runny nose, cough, and having more labored breathing.  Stated he can't sleep, because he can't lay flat, due to feeling like he is "drowning."  C/o feeling like his lungs are 3/4 full.  "I don't know if I have pneumonia or walking pneumonia."   Stated his "chest feels like someone is standing on it, right in the center."  Rated chest discomfort at 1/10 with standing, and 6-7/10 with laying down. Advised he should go to ER with above symptoms.  Pt. stated "I'm not having a heart attack; my chest hurts because my lungs are full."   Reported his wife has been sick with similar sx's., and was diagnosed with Laryngitis.  Stated "I feel like I could have the flu."   Pt. stated he would not go to the ER, and would just wait to see if he got worse.  Offered office appt. at 4:20 PM today with NP.  Advised he should go to the ER if his symptoms worsened.  Pt. Verb. Understanding and agreed.             Reason for Disposition . [1] MODERATE difficulty breathing (e.g., speaks in phrases, SOB even at rest, pulse 100-120) AND [2] NEW-onset or WORSE than normal    C/o feeling "like lungs are 3/4 full"; coughing up green mucus; c/o painful to cough. C/o difficulty laying flat due to feeling like he is drowning.  Answer Assessment - Initial Assessment Questions 1. RESPIRATORY STATUS: "Describe your breathing?" (e.g., wheezing, shortness of breath, unable to speak, severe coughing)      C/o wheezing and breathing is more labored; breathes better standing up  2. ONSET: "When did this breathing problem begin?"      Lung congestion for about one week; increased shortness of breath past 2 days   3. PATTERN "Does the difficult breathing come and go, or has it been constant since it started?"      Constant   4. SEVERITY: "How bad is your breathing?" (e.g., mild, moderate, severe)    - MILD: No SOB at rest, mild SOB with walking, speaks normally in sentences, can lay down, no retractions, pulse < 100.    - MODERATE: SOB at rest, SOB with minimal exertion and prefers to sit, cannot lie down flat, speaks in phrases, mild retractions, audible wheezing, pulse 100-120.    - SEVERE: Very SOB at rest, speaks in single words, struggling to breathe, sitting hunched forward, retractions, pulse > 120      Moderate  5. RECURRENT SYMPTOM: "Have you had difficulty breathing before?" If so, ask: "When was the last time?" and "What happened that time?"      yes 6. CARDIAC HISTORY: "Do you have any history of heart disease?" (e.g., heart attack, angina, bypass surgery, angioplasty)      Denied heart hx 7. LUNG HISTORY: "Do you have any history of lung disease?"  (e.g., pulmonary embolus, asthma, emphysema)     Asthma several yrs. Ago  8. CAUSE: "What do you think is causing the breathing problem?"      Feels like pneumonia  9. OTHER SYMPTOMS: "Do you have any other symptoms? (e.g., dizziness, runny nose, cough, chest pain, fever)     Coughing up green mucus; feels like someone is standing on  chest for a few days, feels like lungs are full; c/o sore muscles about 2 " below shoulder blades, bilateral.  Denied fever; c/o chills; if takes deep breath he coughs.    10. TRAVEL: "Have you traveled out of the country in the last month?" (e.g., travel history, exposures)       denied  Answer Assessment - Initial Assessment Questions 1. ONSET: "When did the cough begin?"      About a week ago 2. SEVERITY: "How bad is the cough today?"      Coughing up green mucus; it hurts to cough 3. RESPIRATORY DISTRESS: "Describe your breathing."      C/o wheezing and breathing is more labored; able to breathe easier if standing upright 4. FEVER: "Do you have a fever?" If so, ask: "What is your temperature, how was it measured, and  when did it start?"     Denied; had chills a couple nights ago 5. SPUTUM: "Describe the color of your sputum" (clear, white, yellow, green)     green 6. HEMOPTYSIS: "Are you coughing up any blood?" If so ask: "How much?" (flecks, streaks, tablespoons, etc.)     no 7. CARDIAC HISTORY: "Do you have any history of heart disease?" (e.g., heart attack, congestive heart failure)      denied 8. LUNG HISTORY: "Do you have any history of lung disease?"  (e.g., pulmonary embolus, asthma, emphysema)     Hx of asthma several yrs ago 42. PE RISK FACTORS: "Do you have a history of blood clots?" (or: recent major surgery, recent prolonged travel, bedridden)    negative 10. OTHER SYMPTOMS: "Do you have any other symptoms?" (e.g., runny nose, wheezing, chest pain)       Runny nose, coughing up green phlegm ; wheezing; feels like someone is standing on chest when lays down; c/o "hurts to cough", lungs feel full; hard to breathe when laying down.    11. PREGNANCY: "Is there any chance you are pregnant?" "When was your last menstrual period?"       n/a 12. TRAVEL: "Have you traveled out of the country in the last month?" (e.g., travel history, exposures)       No  Protocols used: BREATHING DIFFICULTY-A-AH, COUGH - ACUTE PRODUCTIVE-A-AH

## 2018-10-12 ENCOUNTER — Encounter: Payer: Self-pay | Admitting: Family Medicine

## 2018-10-20 ENCOUNTER — Ambulatory Visit (INDEPENDENT_AMBULATORY_CARE_PROVIDER_SITE_OTHER): Payer: 59 | Admitting: Urology

## 2018-10-20 ENCOUNTER — Encounter: Payer: Self-pay | Admitting: Urology

## 2018-10-20 ENCOUNTER — Ambulatory Visit: Payer: Self-pay | Admitting: Urology

## 2018-10-20 ENCOUNTER — Other Ambulatory Visit: Payer: Self-pay | Admitting: Radiology

## 2018-10-20 VITALS — BP 129/77 | HR 93 | Ht 74.0 in | Wt 255.5 lb

## 2018-10-20 DIAGNOSIS — R310 Gross hematuria: Secondary | ICD-10-CM | POA: Diagnosis not present

## 2018-10-20 DIAGNOSIS — N200 Calculus of kidney: Secondary | ICD-10-CM

## 2018-10-20 DIAGNOSIS — R7989 Other specified abnormal findings of blood chemistry: Secondary | ICD-10-CM

## 2018-10-20 LAB — MICROSCOPIC EXAMINATION

## 2018-10-20 LAB — URINALYSIS, COMPLETE
BILIRUBIN UA: NEGATIVE
Glucose, UA: NEGATIVE
Ketones, UA: NEGATIVE
LEUKOCYTES UA: NEGATIVE
NITRITE UA: NEGATIVE
PH UA: 5 (ref 5.0–7.5)
Urobilinogen, Ur: 0.2 mg/dL (ref 0.2–1.0)

## 2018-10-20 MED ORDER — ANDROGEL PUMP 20.25 MG/ACT (1.62%) TD GEL: 4.0000 | Freq: Every day | TRANSDERMAL | 5 refills | Status: DC

## 2018-10-20 NOTE — Progress Notes (Signed)
10/20/2018 4:46 PM   William Frey 1971-08-07 [id number]  Referring provider: Leone Haven, MD [address] [place], [place] [address]  CC: Right flank pain  HPI: I saw William Frey in urology clinic today in consultation for right-sided flank pain from Dr. Caryl Bis.  He is a 47 year old male who was previously followed by Dr. Jacqlyn Larsen for testosterone replacement and nephrolithiasis.  He has passed multiple stones spontaneously but has never required intervention.  He reports approximately 2 weeks of intermittent sharp right-sided flank pain that radiates to the right groin.  CT scan on 10/03/2018 showed a 1.8 cm renal pelvis stone.  His pain is worse when he is very physically active.  There are no alleviating factors.  His pain is currently well controlled. He denies any fevers or chills or dysuria.  His medical history is notable for anxiety and non-insulin-dependent diabetes.   PMH: Past Medical History:  Diagnosis Date  . Allergy   . Anxiety   . Arthritis   . Asthma   . Chicken pox   . Diabetes mellitus without complication ([place])   . GERD (gastroesophageal reflux disease)   . Hyperlipidemia   . Hypertension   . Kidney stones   . Low testosterone   . Migraines     Surgical History: Past Surgical History:  Procedure Laterality Date  . OSTECTOMY METATARSAL Right 2003    Allergies:  Allergies  Allergen Reactions  . Penicillins Other (See Comments)    Childhood allergy  . Penicillins Hives    Family History: Family History  Problem Relation Age of Onset  . Arthritis Mother   . Cancer Mother        breast and bone  . Heart Problems Mother   . Arthritis Father   . Lymphoma Father   . Heart disease Father   . Stroke Father   . Hypertension Father   . Clotting disorder Father   . Cancer Brother        lung  . Heart disease Brother        atriall fibrillation  . Diabetes Brother   . Heart disease Brother        atrial fibrillation  .  Diabetes Maternal Aunt   . Diabetes Maternal Aunt     Social History:  reports that he quit smoking about 5 years ago. His smoking use included cigarettes. He has a 50.00 pack-year smoking history. He has never used smokeless tobacco. He reports that he does not drink alcohol or use drugs.  ROS: Please see flowsheet from today's date for complete review of systems.  Physical Exam: BP 129/77 (BP Location: Left Arm, Patient Position: Sitting, Cuff Size: Normal)   Pulse 93   Ht 6\' 2"  (1.88 m)   Wt 255 lb 8 oz (115.9 kg)   BMI 32.80 kg/m    Constitutional:  Alert and oriented, No acute distress. Cardiovascular: Regular rate and rhythm Respiratory: Clear to auscultation bilaterally GI: Abdomen is soft, nontender, nondistended, no abdominal masses GU: Mild right CVA tenderness Lymph: No cervical or inguinal lymphadenopathy. Skin: No rashes, bruises or suspicious lesions. Neurologic: Grossly intact, no focal deficits, moving all 4 extremities. Psychiatric: Normal mood and affect.  Laboratory Data: Serum creatinine 1.1, 3GFR greater than 60  Urinalysis today 0-5 WBCs, greater than 30 RBCs, 0-10 epithelial cells, few bacteria, nitrite negative  Pertinent Imaging: I have personally reviewed the CT stone protocol, 1.8 cm right renal pelvis stone, no hydronephrosis  Assessment & Plan:  In summary, William Frey is a 47 year old relatively healthy male with a right sided 1.8 cm renal pelvis stone.  He has no clinical signs of infection and his pain is controlled.  We discussed various treatment options for urolithiasis including observation with or without medical expulsive therapy, shockwave lithotripsy (SWL), ureteroscopy and laser lithotripsy with stent placement, and percutaneous nephrolithotomy.  We discussed that management is based on stone size, location, density, patient co-morbidities, and patient preference.   Stones <37mm in size have a >80% spontaneous passage rate. Data  surrounding the use of tamsulosin for medical expulsive therapy is controversial, but meta analyses suggests it is most efficacious for distal stones between 5-48mm in size. Possible side effects include dizziness/lightheadedness, and retrograde ejaculation.  SWL has a lower stone free rate in a single procedure, but also a lower complication rate compared to ureteroscopy and avoids a stent and associated stent related symptoms. Possible complications include renal hematoma, steinstrasse, and need for additional treatment.  Ureteroscopy with laser lithotripsy and stent placement has a higher stone free rate than SWL in a single procedure, however increased complication rate including possible infection, ureteral injury, bleeding, and stent related morbidity. Common stent related symptoms include dysuria, urgency/frequency, and flank pain.  PCNL is the favored treatment for stones >2cm. It involves a small incision in the flank, with complete fragmentation of stones and removal. It has the highest stone free rate, but also the highest complication rate. Possible complications include bleeding, infection/sepsis, injury to surrounding organs including the pleura, and collecting system injury.   After an extensive discussion of the risks and benefits of the above treatment options, the patient would like to proceed with right ureteroscopy, laser lithotripsy, stent placement.  Follow-up in 6 weeks with Larene Beach for testosterone replacement  Billey Co, MD  [facility] [address] [place], [place] [address] [phone]

## 2018-10-21 ENCOUNTER — Ambulatory Visit: Payer: Self-pay | Admitting: Urology

## 2018-10-21 ENCOUNTER — Telehealth: Payer: Self-pay | Admitting: Radiology

## 2018-10-21 ENCOUNTER — Other Ambulatory Visit: Payer: Self-pay | Admitting: Radiology

## 2018-10-21 DIAGNOSIS — N200 Calculus of kidney: Secondary | ICD-10-CM

## 2018-10-21 NOTE — Telephone Encounter (Signed)
Patient was given the [facility] Information form below as well as the Instructions for Pre-Admission Testing form & a map of [facility].   William Frey, William Frey, [place] F[place] [address] Telephone: [phone] Fax: [phone]   Thank you for choosing [facility] for your upcoming surgery!  We are always here to assist in your urological needs.  Please read the following information with specific details for your upcoming appointments related to your surgery. Please contact Domique Clapper at [phone] Option 3 with any questions.  The Name of Your Surgery: Right ureteroscopy, laser lithotripsy, stone removal, right ureteral stent placement  Your Surgery Date: 10/28/2018 Your Surgeon: Nickolas Madrid  Please call Same Day Surgery at [phone] between the hours of 1pm-3pm one day prior to your surgery. They will inform you of the time to arrive at Same Day Surgery which is located on the second floor of the [facility].   Please refer to the attached letter regarding instructions for Pre-Admission Testing. You will receive a call from the [facility] [place] office regarding your appointment with them.  The Pre-Admission Testing office is located at [place], on the first floor of the [place] [facility] in [place] (office is to the right as you enter through the [employer] of the [facility]). Please have all medications you are currently taking and your insurance card available.   Patient was advised to have nothing to eat or drink after midnight the night prior to surgery except that he may have only water until 2 hours before surgery with nothing to drink within 2 hours of surgery.  The patient states he currently takes no blood thinners. Patient's questions were answered and he expressed understanding of these instructions.

## 2018-10-21 NOTE — Telephone Encounter (Signed)
Penicillin causes hives. Should perioperative antibiotic be changed  from Ancef?

## 2018-10-21 NOTE — Telephone Encounter (Signed)
cipro is fine, thanks

## 2018-10-23 LAB — CULTURE, URINE COMPREHENSIVE

## 2018-10-25 ENCOUNTER — Other Ambulatory Visit: Payer: Self-pay

## 2018-10-25 ENCOUNTER — Other Ambulatory Visit: Payer: Self-pay | Admitting: Radiology

## 2018-10-25 ENCOUNTER — Encounter
Admission: RE | Admit: 2018-10-25 | Discharge: 2018-10-25 | Disposition: A | Discharge status: Home / Self Care | Payer: 59 | Source: Ambulatory Visit | Attending: Urology | Admitting: Urology

## 2018-10-25 DIAGNOSIS — J45909 Unspecified asthma, uncomplicated: Secondary | ICD-10-CM | POA: Diagnosis not present

## 2018-10-25 DIAGNOSIS — Z79899 Other long term (current) drug therapy: Secondary | ICD-10-CM | POA: Diagnosis not present

## 2018-10-25 DIAGNOSIS — N200 Calculus of kidney: Secondary | ICD-10-CM

## 2018-10-25 DIAGNOSIS — Z87891 Personal history of nicotine dependence: Secondary | ICD-10-CM | POA: Diagnosis not present

## 2018-10-25 DIAGNOSIS — Z01818 Encounter for other preprocedural examination: Secondary | ICD-10-CM | POA: Insufficient documentation

## 2018-10-25 DIAGNOSIS — E119 Type 2 diabetes mellitus without complications: Secondary | ICD-10-CM

## 2018-10-25 DIAGNOSIS — F419 Anxiety disorder, unspecified: Secondary | ICD-10-CM | POA: Diagnosis not present

## 2018-10-25 DIAGNOSIS — I10 Essential (primary) hypertension: Secondary | ICD-10-CM | POA: Diagnosis not present

## 2018-10-25 DIAGNOSIS — K219 Gastro-esophageal reflux disease without esophagitis: Secondary | ICD-10-CM | POA: Diagnosis not present

## 2018-10-25 DIAGNOSIS — Z7984 Long term (current) use of oral hypoglycemic drugs: Secondary | ICD-10-CM | POA: Diagnosis not present

## 2018-10-25 HISTORY — DX: Family history of other specified conditions: Z84.89

## 2018-10-25 HISTORY — DX: Personal history of urinary calculi: Z87.442

## 2018-10-25 HISTORY — DX: Adverse effect of unspecified anesthetic, initial encounter: T41.45XA

## 2018-10-25 HISTORY — DX: Other complications of anesthesia, initial encounter: T88.59XA

## 2018-10-25 NOTE — Patient Instructions (Addendum)
Your procedure is scheduled on: Friday 10/28/18 Report to [facility]. To find out your arrival time please call [phone] between 1PM - 3PM on Thursday 10/27/18.  Remember: Instructions that are not followed completely may result in serious medical risk, up to and including death, or upon the discretion of your surgeon and anesthesiologist your surgery may need to be rescheduled.     _X__ 1. Do not eat food after midnight the night before your procedure.                 No gum chewing or hard candies. You may drink clear liquids up to 2 hours                 before you are scheduled to arrive for your surgery- DO not drink clear                 liquids within 2 hours of the start of your surgery.                 Clear Liquids include:  water, apple juice without pulp, clear carbohydrate                 drink such as Clearfast or Gatorade, Black Coffee or Tea (Do not add                 anything to coffee or tea).  __X__2.  On the morning of surgery brush your teeth with toothpaste and water, you                 may rinse your mouth with mouthwash if you wish.  Do not swallow any              toothpaste of mouthwash.     _X__ 3.  No Alcohol for 24 hours before or after surgery.   _X__ 4.  Do Not Smoke or use e-cigarettes For 24 Hours Prior to Your Surgery.                 Do not use any chewable tobacco products for at least 6 hours prior to                 surgery.  ____  5.  Bring all medications with you on the day of surgery if instructed.   __X__  6.  Notify your doctor if there is any change in your medical condition      (cold, fever, infections).     Do not wear jewelry, make-up, hairpins, clips or nail polish. Do not wear lotions, powders, or perfumes.  Do not shave 48 hours prior to surgery. Men may shave face and neck. Do not bring valuables to the hospital.    [facility] is not responsible for any belongings or  valuables.  Contacts, dentures/partials or body piercings may not be worn into surgery. Bring a case for your contacts, glasses or hearing aids, a denture cup will be supplied. Leave your suitcase in the car. After surgery it may be brought to your room. For patients admitted to the hospital, discharge time is determined by your treatment team.   Patients discharged the day of surgery will not be allowed to drive home.   Please read over the following fact sheets that you were given:   MRSA Information  __X__ Take these medicines the morning of surgery with A SIP OF WATER:  1. none  2.   3.   4.  5.  6.  ____ Fleet Enema (as directed)   __X__ Use CHG Soap/SAGE wipes as directed  ____ Use inhalers on the day of surgery  __X__ Stop metformin/Janumet/Farxiga 2 days prior to surgery    ____ Take 1/2 of usual insulin dose the night before surgery. No insulin the morning          of surgery.   ____ Stop Blood Thinners Coumadin/Plavix/Xarelto/Pleta/Pradaxa/Eliquis/Effient/Aspirin  on   Or contact your Surgeon, Cardiologist or Medical Doctor regarding  ability to stop your blood thinners  __X__ Stop Anti-inflammatories 7 days before surgery such as Advil, Ibuprofen, Motrin,  BC or Goodies Powder, Naprosyn, Naproxen, Aleve, Aspirin    __X__ Stop all herbal supplements, fish oil or vitamin E until after surgery.    ____ Bring C-Pap to the hospital.

## 2018-10-28 ENCOUNTER — Ambulatory Visit: Payer: 59 | Admitting: Anesthesiology

## 2018-10-28 ENCOUNTER — Ambulatory Visit
Admission: RE | Admit: 2018-10-28 | Discharge: 2018-10-28 | Disposition: A | Discharge status: Home / Self Care | Payer: 59 | Attending: Urology | Admitting: Urology

## 2018-10-28 ENCOUNTER — Encounter: Payer: Self-pay | Admitting: Anesthesiology

## 2018-10-28 ENCOUNTER — Other Ambulatory Visit: Payer: Self-pay

## 2018-10-28 ENCOUNTER — Encounter
Admission: RE | Disposition: A | Discharge status: Home / Self Care | Payer: Self-pay | Source: Home / Self Care | Attending: Urology

## 2018-10-28 DIAGNOSIS — F419 Anxiety disorder, unspecified: Secondary | ICD-10-CM | POA: Insufficient documentation

## 2018-10-28 DIAGNOSIS — I10 Essential (primary) hypertension: Secondary | ICD-10-CM | POA: Diagnosis not present

## 2018-10-28 DIAGNOSIS — N200 Calculus of kidney: Secondary | ICD-10-CM | POA: Insufficient documentation

## 2018-10-28 DIAGNOSIS — E785 Hyperlipidemia, unspecified: Secondary | ICD-10-CM | POA: Diagnosis not present

## 2018-10-28 DIAGNOSIS — K219 Gastro-esophageal reflux disease without esophagitis: Secondary | ICD-10-CM | POA: Insufficient documentation

## 2018-10-28 DIAGNOSIS — Z87891 Personal history of nicotine dependence: Secondary | ICD-10-CM | POA: Diagnosis not present

## 2018-10-28 DIAGNOSIS — Z79899 Other long term (current) drug therapy: Secondary | ICD-10-CM | POA: Diagnosis not present

## 2018-10-28 DIAGNOSIS — Z7984 Long term (current) use of oral hypoglycemic drugs: Secondary | ICD-10-CM | POA: Insufficient documentation

## 2018-10-28 DIAGNOSIS — J45909 Unspecified asthma, uncomplicated: Secondary | ICD-10-CM | POA: Insufficient documentation

## 2018-10-28 DIAGNOSIS — E119 Type 2 diabetes mellitus without complications: Secondary | ICD-10-CM | POA: Diagnosis not present

## 2018-10-28 HISTORY — PX: CYSTOSCOPY/URETEROSCOPY/HOLMIUM LASER/STENT PLACEMENT: SHX6546

## 2018-10-28 HISTORY — PX: CYSTOSCOPY W/ RETROGRADES: SHX1426

## 2018-10-28 LAB — GLUCOSE, CAPILLARY
Glucose-Capillary: 137 mg/dL — ABNORMAL HIGH (ref 70–99)
Glucose-Capillary: 151 mg/dL — ABNORMAL HIGH (ref 70–99)

## 2018-10-28 SURGERY — CYSTOSCOPY/URETEROSCOPY/HOLMIUM LASER/STENT PLACEMENT
Anesthesia: General | Laterality: Right

## 2018-10-28 MED ORDER — LIDOCAINE HCL (CARDIAC) PF 100 MG/5ML IV SOSY
PREFILLED_SYRINGE | INTRAVENOUS | Status: DC | PRN
  Administered 2018-10-28: 40 mg via INTRAVENOUS

## 2018-10-28 MED ORDER — HYDROMORPHONE HCL 1 MG/ML IJ SOLN
0.5000 mg | INTRAMUSCULAR | Status: DC | PRN
  Administered 2018-10-28 (×2): 0.5 mg via INTRAVENOUS

## 2018-10-28 MED ORDER — SUGAMMADEX SODIUM 200 MG/2ML IV SOLN
INTRAVENOUS | Status: DC | PRN
  Administered 2018-10-28: 200 mg via INTRAVENOUS

## 2018-10-28 MED ORDER — BELLADONNA ALKALOIDS-OPIUM 16.2-60 MG RE SUPP
RECTAL | Status: AC
  Administered 2018-10-28: 1 via RECTAL
  Filled 2018-10-28: qty 1

## 2018-10-28 MED ORDER — HYDROMORPHONE HCL 1 MG/ML IJ SOLN
INTRAMUSCULAR | Status: AC
  Administered 2018-10-28: 0.5 mg via INTRAVENOUS
  Filled 2018-10-28: qty 1

## 2018-10-28 MED ORDER — HYDROCODONE-ACETAMINOPHEN 5-325 MG PO TABS
ORAL_TABLET | ORAL | Status: AC
  Administered 2018-10-28: 1 via ORAL
  Filled 2018-10-28: qty 1

## 2018-10-28 MED ORDER — BELLADONNA ALKALOIDS-OPIUM 16.2-60 MG RE SUPP
1.0000 | Freq: Every day | RECTAL | Status: DC
  Administered 2018-10-28: 1 via RECTAL

## 2018-10-28 MED ORDER — MIDAZOLAM HCL 2 MG/2ML IJ SOLN
INTRAMUSCULAR | Status: AC
  Filled 2018-10-28: qty 2

## 2018-10-28 MED ORDER — FENTANYL CITRATE (PF) 100 MCG/2ML IJ SOLN
INTRAMUSCULAR | Status: AC
  Filled 2018-10-28: qty 2

## 2018-10-28 MED ORDER — ONDANSETRON HCL 4 MG/2ML IJ SOLN
INTRAMUSCULAR | Status: AC
  Filled 2018-10-28: qty 2

## 2018-10-28 MED ORDER — HYDROCODONE-ACETAMINOPHEN 5-325 MG PO TABS: 1.0000 | ORAL_TABLET | ORAL | 0 refills | Status: DC | PRN

## 2018-10-28 MED ORDER — ONDANSETRON HCL 4 MG/2ML IJ SOLN: 4.0000 mg | Freq: Once | INTRAMUSCULAR | Status: DC | PRN

## 2018-10-28 MED ORDER — FENTANYL CITRATE (PF) 100 MCG/2ML IJ SOLN
INTRAMUSCULAR | Status: DC | PRN
  Administered 2018-10-28: 100 ug via INTRAVENOUS

## 2018-10-28 MED ORDER — FENTANYL CITRATE (PF) 100 MCG/2ML IJ SOLN
INTRAMUSCULAR | Status: AC
  Administered 2018-10-28: 25 ug via INTRAVENOUS
  Filled 2018-10-28: qty 2

## 2018-10-28 MED ORDER — ONDANSETRON HCL 4 MG/2ML IJ SOLN
INTRAMUSCULAR | Status: DC | PRN
  Administered 2018-10-28: 4 mg via INTRAVENOUS

## 2018-10-28 MED ORDER — HYDROCODONE-ACETAMINOPHEN 5-325 MG PO TABS
1.0000 | ORAL_TABLET | Freq: Once | ORAL | Status: AC
  Administered 2018-10-28: 1 via ORAL

## 2018-10-28 MED ORDER — SODIUM CHLORIDE 0.9 % IV SOLN
INTRAVENOUS | Status: DC
  Administered 2018-10-28 (×2): via INTRAVENOUS

## 2018-10-28 MED ORDER — FAMOTIDINE 20 MG PO TABS
20.0000 mg | ORAL_TABLET | Freq: Once | ORAL | Status: AC
  Administered 2018-10-28: 20 mg via ORAL

## 2018-10-28 MED ORDER — MIDAZOLAM HCL 2 MG/2ML IJ SOLN
INTRAMUSCULAR | Status: DC | PRN
  Administered 2018-10-28 (×2): 2 mg via INTRAVENOUS

## 2018-10-28 MED ORDER — TOLTERODINE TARTRATE ER 4 MG PO CP24: 4.0000 mg | ORAL_CAPSULE | Freq: Every day | ORAL | 0 refills | Status: DC

## 2018-10-28 MED ORDER — CIPROFLOXACIN IN D5W 400 MG/200ML IV SOLN: 400.0000 mg | Freq: Once | INTRAVENOUS | Status: DC

## 2018-10-28 MED ORDER — DEXAMETHASONE SODIUM PHOSPHATE 10 MG/ML IJ SOLN
INTRAMUSCULAR | Status: DC | PRN
  Administered 2018-10-28: 10 mg via INTRAVENOUS

## 2018-10-28 MED ORDER — ROCURONIUM BROMIDE 100 MG/10ML IV SOLN
INTRAVENOUS | Status: DC | PRN
  Administered 2018-10-28: 15 mg via INTRAVENOUS
  Administered 2018-10-28: 30 mg via INTRAVENOUS
  Administered 2018-10-28: 20 mg via INTRAVENOUS

## 2018-10-28 MED ORDER — FENTANYL CITRATE (PF) 100 MCG/2ML IJ SOLN
25.0000 ug | INTRAMUSCULAR | Status: DC | PRN
  Administered 2018-10-28 (×3): 25 ug via INTRAVENOUS

## 2018-10-28 MED ORDER — IOPAMIDOL (ISOVUE-M 200) INJECTION 41%
INTRAMUSCULAR | Status: DC | PRN
  Administered 2018-10-28: 4 mL

## 2018-10-28 MED ORDER — FAMOTIDINE 20 MG PO TABS
ORAL_TABLET | ORAL | Status: AC
  Filled 2018-10-28: qty 1

## 2018-10-28 MED ORDER — SULFAMETHOXAZOLE-TRIMETHOPRIM 800-160 MG PO TABS: 1.0000 | ORAL_TABLET | Freq: Two times a day (BID) | ORAL | 0 refills | Status: DC

## 2018-10-28 MED ORDER — ROCURONIUM BROMIDE 50 MG/5ML IV SOLN
INTRAVENOUS | Status: AC
  Filled 2018-10-28: qty 1

## 2018-10-28 MED ORDER — PROPOFOL 10 MG/ML IV BOLUS
INTRAVENOUS | Status: AC
  Filled 2018-10-28: qty 20

## 2018-10-28 MED ORDER — CIPROFLOXACIN IN D5W 400 MG/200ML IV SOLN
INTRAVENOUS | Status: AC
  Filled 2018-10-28: qty 200

## 2018-10-28 MED ORDER — PROPOFOL 10 MG/ML IV BOLUS
INTRAVENOUS | Status: DC | PRN
  Administered 2018-10-28: 200 mg via INTRAVENOUS

## 2018-10-28 MED ORDER — TAMSULOSIN HCL 0.4 MG PO CAPS
0.4000 mg | ORAL_CAPSULE | Freq: Every day | ORAL | Status: DC
  Administered 2018-10-28: 0.4 mg via ORAL
  Filled 2018-10-28: qty 1

## 2018-10-28 MED ORDER — SUGAMMADEX SODIUM 200 MG/2ML IV SOLN
INTRAVENOUS | Status: AC
  Filled 2018-10-28: qty 2

## 2018-10-28 SURGICAL SUPPLY — 34 items
BAG DRAIN CYSTO-URO LG1000N (MISCELLANEOUS) ×3 IMPLANT
BRUSH SCRUB EZ 1% IODOPHOR (MISCELLANEOUS) ×3 IMPLANT
BULB IRRIG PATHFIND (MISCELLANEOUS) IMPLANT
CATH URETL 5X70 OPEN END (CATHETERS) ×3 IMPLANT
CNTNR SPEC 2.5X3XGRAD LEK (MISCELLANEOUS)
CONT SPEC 4OZ STER OR WHT (MISCELLANEOUS)
CONTAINER SPEC 2.5X3XGRAD LEK (MISCELLANEOUS) IMPLANT
DRAPE UTILITY 15X26 TOWEL STRL (DRAPES) ×3 IMPLANT
FIBER LASER LITHO 273 (Laser) ×3 IMPLANT
GLOVE BIOGEL PI IND STRL 7.5 (GLOVE) ×1 IMPLANT
GLOVE BIOGEL PI INDICATOR 7.5 (GLOVE) ×2
GOWN STRL REUS W/ TWL LRG LVL3 (GOWN DISPOSABLE) ×1 IMPLANT
GOWN STRL REUS W/ TWL XL LVL3 (GOWN DISPOSABLE) ×1 IMPLANT
GOWN STRL REUS W/TWL LRG LVL3 (GOWN DISPOSABLE) ×2
GOWN STRL REUS W/TWL XL LVL3 (GOWN DISPOSABLE) ×2
INFUSOR MANOMETER BAG 3000ML (MISCELLANEOUS) ×3 IMPLANT
INTRODUCER DILATOR DOUBLE (INTRODUCER) ×3 IMPLANT
KIT TURNOVER CYSTO (KITS) ×3 IMPLANT
PACK CYSTO AR (MISCELLANEOUS) ×3 IMPLANT
SENSORWIRE 0.038 NOT ANGLED (WIRE) ×6
SET CYSTO W/LG BORE CLAMP LF (SET/KITS/TRAYS/PACK) ×3 IMPLANT
SHEATH URETERAL 12FR 45CM (SHEATH) ×3 IMPLANT
SHEATH URETERAL 12FRX35CM (MISCELLANEOUS) IMPLANT
SOL .9 NS 3000ML IRR  AL (IV SOLUTION) ×2
SOL .9 NS 3000ML IRR UROMATIC (IV SOLUTION) ×1 IMPLANT
STENT URET 6FRX24 CONTOUR (STENTS) IMPLANT
STENT URET 6FRX26 CONTOUR (STENTS) IMPLANT
STENT URET 6FRX28 CONTOUR (STENTS) ×3 IMPLANT
SURGILUBE 2OZ TUBE FLIPTOP (MISCELLANEOUS) ×3 IMPLANT
SYR 10ML LL (SYRINGE) ×3 IMPLANT
TUBING ART PRESS 48 MALE/FEM (TUBING) IMPLANT
VALVE UROSEAL ADJ ENDO (VALVE) ×3 IMPLANT
WATER STERILE IRR 1000ML POUR (IV SOLUTION) ×3 IMPLANT
WIRE SENSOR 0.038 NOT ANGLED (WIRE) ×2 IMPLANT

## 2018-10-28 NOTE — Op Note (Signed)
Date of procedure: 10/28/18  Preoperative diagnosis:  1. Right 1.8 cm renal stone  Postoperative diagnosis:  1. Same  Procedure: 1. Cystoscopy, right ureteroscopy, laser lithotripsy, stent placement, retrograde pyelogram  Surgeon: Nickolas Madrid, MD  Anesthesia: General  Complications: None  Intraoperative findings:  1.  Normal cystoscopy, small to moderate prostate, normal urethra 2.  Large right hard renal stone dusted to <1 mm fragments 3.  Uncomplicated right ureteral stent placement  EBL: Minimal  Specimens: None  Drains: Right 6FX 28 cm stent  Indication: William Frey is a 47 y.o. patient with 1.8 cm right renal stone and intermittent right-sided flank pain.  After reviewing the management options for treatment, they elected to proceed with the above surgical procedure(s). We have discussed the potential benefits and risks of the procedure, side effects of the proposed treatment, the likelihood of the patient achieving the goals of the procedure, and any potential problems that might occur during the procedure or recuperation. Informed consent has been obtained.  Description of procedure:  The patient was taken to the operating room and general anesthesia was induced.  The patient was placed in the dorsal lithotomy position, prepped and draped in the usual sterile fashion, and preoperative antibiotics were administered. A preoperative time-out was performed.   A 21 French rigid cystoscope was used to intubate the urethra.  Normal-appearing urethra was followed proximally into the bladder.  The prostate was small to moderate in size.  Thorough cystoscopy was performed and the bladder was grossly normal.  The ureteral orifices were orthotopic bilaterally.  A sensor wire was used to intubate the right ureteral orifice and was advanced under fluoroscopic vision up into the right renal pelvis.  A dual lumen access catheter was used to add a second safety wire, and dilate the  ureter.  I attempted to pass a 12/14 [place] ureteral access sheath, however met resistance in the distal ureter and crossing vessels.  The access sheath was removed and the flexible ureteroscope was advanced over the wire up into the collecting system.  A yellow and black hard appearing stone was immediately identified.  The 270 m laser fiber was used to dust the stone on settings of 0.4 J and 40 Hz.  Thorough inspection of the kidney revealed no residual fragments greater than 1 mm in size.  Fluoroscopy showed no large residual fragments.  Contrast was injected and showed no filling defects.  Pullback ureteroscopy demonstrated no ureteral fragments, and the ureter was widely patent with no injury seen.  A 21 French rigid cystoscope was backloaded over the wire, and a 6 [place] by 28 cm stent was uneventfully placed under direct and fluoroscopic vision.  There was an excellent curl in the renal pelvis as well as in the bladder under direct vision.  The bladder was drained and this concluded our procedure  Disposition: Stable to PACU  Plan: Follow-up for stent removal in 10 to 14 days  Nickolas Madrid, MD

## 2018-10-28 NOTE — Anesthesia Preprocedure Evaluation (Signed)
Anesthesia Evaluation  Patient identified by MRN, date of birth, ID band Patient awake    Reviewed: Allergy & Precautions, NPO status , Patient's Chart, lab work & pertinent test results, reviewed documented beta blocker date and time   History of Anesthesia Complications (+) Family history of anesthesia reaction and history of anesthetic complications  Airway Mallampati: III  TM Distance: >3 FB     Dental  (+) Chipped   Pulmonary asthma , former smoker,           Cardiovascular hypertension, Pt. on medications      Neuro/Psych  Headaches, Anxiety    GI/Hepatic GERD  Controlled,  Endo/Other  diabetes, Type 2  Renal/GU      Musculoskeletal  (+) Arthritis ,   Abdominal   Peds  Hematology   Anesthesia Other Findings EKG ok.  Reproductive/Obstetrics                             Anesthesia Physical Anesthesia Plan  ASA: III  Anesthesia Plan: General   Post-op Pain Management:    Induction: Intravenous  PONV Risk Score and Plan:   Airway Management Planned: Oral ETT and LMA  Additional Equipment:   Intra-op Plan:   Post-operative Plan:   Informed Consent: I have reviewed the patients History and Physical, chart, labs and discussed the procedure including the risks, benefits and alternatives for the proposed anesthesia with the patient or authorized representative who has indicated his/her understanding and acceptance.     Plan Discussed with: CRNA  Anesthesia Plan Comments:         Anesthesia Quick Evaluation

## 2018-10-28 NOTE — Anesthesia Postprocedure Evaluation (Signed)
Anesthesia Post Note  Patient: William Frey  Procedure(s) Performed: CYSTOSCOPY/URETEROSCOPY/HOLMIUM LASER/STENT PLACEMENT (Right ) CYSTOSCOPY WITH RETROGRADE PYELOGRAM (Right )  Patient location during evaluation: PACU Anesthesia Type: General Level of consciousness: awake and alert Pain management: pain level controlled Vital Signs Assessment: post-procedure vital signs reviewed and stable Respiratory status: spontaneous breathing, nonlabored ventilation, respiratory function stable and patient connected to nasal cannula oxygen Cardiovascular status: blood pressure returned to baseline and stable Postop Assessment: no apparent nausea or vomiting Anesthetic complications: no     Last Vitals:  Vitals:   10/28/18 1340 10/28/18 1422  BP: (!) 157/94 (!) 134/93  Pulse: 65 69  Resp: 20 20  Temp: (!) 36.2 C   SpO2:  96%    Last Pain:  Vitals:   10/28/18 1340  TempSrc: Temporal  PainSc: [place]

## 2018-10-28 NOTE — Transfer of Care (Signed)
Immediate Anesthesia Transfer of Care Note  Patient: William Frey  Procedure(s) Performed: CYSTOSCOPY/URETEROSCOPY/HOLMIUM LASER/STENT PLACEMENT (Right ) CYSTOSCOPY WITH RETROGRADE PYELOGRAM (Right )  Patient Location: PACU  Anesthesia Type:General  Level of Consciousness: drowsy  Airway & Oxygen Therapy: Patient Spontanous Breathing and Patient connected to face mask oxygen  Post-op Assessment: Report given to RN, Post -op Vital signs reviewed and stable and Patient moving all extremities  Post vital signs: Reviewed and stable  Last Vitals:  Vitals Value Taken Time  BP 127/84 10/28/2018 12:05 PM  Temp    Pulse 77 10/28/2018 12:07 PM  Resp    SpO2 97 % 10/28/2018 12:07 PM  Vitals shown include unvalidated device data.  Last Pain:  Vitals:   10/28/18 0959  TempSrc: Oral  PainSc: 0-No pain         Complications: No apparent anesthesia complications

## 2018-10-28 NOTE — Discharge Instructions (Signed)

## 2018-10-28 NOTE — Anesthesia Post-op Follow-up Note (Signed)
Anesthesia QCDR form completed.        

## 2018-10-28 NOTE — Anesthesia Procedure Notes (Signed)
Procedure Name: Intubation Date/Time: 10/28/2018 10:45 AM Performed by: Lorie Apley, CRNA Pre-anesthesia Checklist: Patient identified, Patient being monitored, Timeout performed, Emergency Drugs available and Suction available Patient Re-evaluated:Patient Re-evaluated prior to induction Oxygen Delivery Method: Circle system utilized Preoxygenation: Pre-oxygenation with 100% oxygen Induction Type: IV induction Ventilation: Mask ventilation without difficulty Laryngoscope Size: Mac and 3 Grade View: Grade I Tube type: Oral Tube size: 7.0 mm Number of attempts: 1 Airway Equipment and Method: Stylet Placement Confirmation: ETT inserted through vocal cords under direct vision,  positive ETCO2 and breath sounds checked- equal and bilateral Secured at: 21 cm Tube secured with: Tape Dental Injury: Teeth and Oropharynx as per pre-operative assessment

## 2018-10-28 NOTE — H&P (Signed)
UROLOGY H&P UPDATE  Agree with prior H&P dated 10/20/2018  Cardiac: RRR Lungs: CTA bilaterally  Laterality: RIGHT Procedure: RIGHT URS/LL/Stent  Urinalysis: Urine culture 12/12 no growth  Informed consent obtained, we specifically discussed the risk of bleeding, infection, need for possible staged procedures, stent related symptoms, need for follow up stent removal, and ureteral injury.  With his DM2 and HTN and protein in the urine, I recommended referral to nephrology.  Billey Co, MD 10/28/2018

## 2018-10-31 ENCOUNTER — Encounter: Payer: Self-pay | Admitting: Urology

## 2018-10-31 ENCOUNTER — Telehealth: Payer: Self-pay | Admitting: Urology

## 2018-10-31 NOTE — Telephone Encounter (Signed)
Pt had surgery last Friday and is still bleeding, he went back to work, but he feels bad and threw up.  Please call pt [phone]

## 2018-10-31 NOTE — Telephone Encounter (Signed)
Spoke to patient and informed him that blood in urine is normal. If he has difficulty urinating or having Blood clots he will need to be seen. I told him that Tamsulosin can lower his BP and if he is having light headedness or dizziness he may want to consider taking that medication in the evening. Patient voiced understanding.

## 2018-11-01 ENCOUNTER — Emergency Department: Payer: 59

## 2018-11-01 ENCOUNTER — Encounter: Payer: Self-pay | Admitting: Emergency Medicine

## 2018-11-01 ENCOUNTER — Emergency Department
Admission: EM | Admit: 2018-11-01 | Discharge: 2018-11-01 | Disposition: A | Discharge status: Home / Self Care | Payer: 59 | Attending: Emergency Medicine | Admitting: Emergency Medicine

## 2018-11-01 ENCOUNTER — Other Ambulatory Visit: Payer: Self-pay

## 2018-11-01 DIAGNOSIS — R1031 Right lower quadrant pain: Secondary | ICD-10-CM | POA: Diagnosis not present

## 2018-11-01 DIAGNOSIS — Z87891 Personal history of nicotine dependence: Secondary | ICD-10-CM | POA: Insufficient documentation

## 2018-11-01 DIAGNOSIS — Z7984 Long term (current) use of oral hypoglycemic drugs: Secondary | ICD-10-CM | POA: Insufficient documentation

## 2018-11-01 DIAGNOSIS — J45909 Unspecified asthma, uncomplicated: Secondary | ICD-10-CM | POA: Insufficient documentation

## 2018-11-01 DIAGNOSIS — R109 Unspecified abdominal pain: Secondary | ICD-10-CM

## 2018-11-01 DIAGNOSIS — E119 Type 2 diabetes mellitus without complications: Secondary | ICD-10-CM | POA: Diagnosis not present

## 2018-11-01 DIAGNOSIS — N200 Calculus of kidney: Secondary | ICD-10-CM | POA: Diagnosis not present

## 2018-11-01 DIAGNOSIS — I10 Essential (primary) hypertension: Secondary | ICD-10-CM | POA: Insufficient documentation

## 2018-11-01 DIAGNOSIS — N132 Hydronephrosis with renal and ureteral calculous obstruction: Secondary | ICD-10-CM | POA: Diagnosis not present

## 2018-11-01 LAB — CBC WITH DIFFERENTIAL/PLATELET
Abs Immature Granulocytes: 0.03 10*3/uL (ref 0.00–0.07)
BASOS PCT: 1 %
Basophils Absolute: 0.1 10*3/uL (ref 0.0–0.1)
Eosinophils Absolute: 0.2 10*3/uL (ref 0.0–0.5)
Eosinophils Relative: 2 %
HCT: 41.6 % (ref 39.0–52.0)
Hemoglobin: 13.9 g/dL (ref 13.0–17.0)
Immature Granulocytes: 0 %
Lymphocytes Relative: 39 %
Lymphs Abs: 3.9 10*3/uL (ref 0.7–4.0)
MCH: 30 pg (ref 26.0–34.0)
MCHC: 33.4 g/dL (ref 30.0–36.0)
MCV: 89.8 fL (ref 80.0–100.0)
Monocytes Absolute: 0.9 10*3/uL (ref 0.1–1.0)
Monocytes Relative: 9 %
NEUTROS ABS: 5 10*3/uL (ref 1.7–7.7)
Neutrophils Relative %: 49 %
PLATELETS: 181 10*3/uL (ref 150–400)
RBC: 4.63 MIL/uL (ref 4.22–5.81)
RDW: 11.9 % (ref 11.5–15.5)
WBC: 10.1 10*3/uL (ref 4.0–10.5)
nRBC: 0 % (ref 0.0–0.2)

## 2018-11-01 LAB — BASIC METABOLIC PANEL
Anion gap: 9 (ref 5–15)
BUN: 22 mg/dL — AB (ref 6–20)
CO2: 22 mmol/L (ref 22–32)
Calcium: 9.1 mg/dL (ref 8.9–10.3)
Chloride: 104 mmol/L (ref 98–111)
Creatinine, Ser: 1.37 mg/dL — ABNORMAL HIGH (ref 0.61–1.24)
GFR calc Af Amer: >60 mL/min (ref >60)
GFR calc non Af Amer: >60 mL/min (ref >60)
GLUCOSE: 159 mg/dL — AB (ref 70–99)
Potassium: 4.1 mmol/L (ref 3.5–5.1)
Sodium: 135 mmol/L (ref 135–145)

## 2018-11-01 LAB — URINALYSIS, COMPLETE (UACMP) WITH MICROSCOPIC
Bilirubin Urine: NEGATIVE
Glucose, UA: NEGATIVE mg/dL
Ketones, ur: NEGATIVE mg/dL
Nitrite: NEGATIVE
PROTEIN: 100 mg/dL — AB
RBC / HPF: >50 RBC/hpf — ABNORMAL HIGH (ref 0–5)
SQUAMOUS EPITHELIAL / LPF: NONE SEEN (ref 0–5)
Specific Gravity, Urine: 1.017 (ref 1.005–1.030)
WBC, UA: >50 WBC/hpf — ABNORMAL HIGH (ref 0–5)
pH: 5 (ref 5.0–8.0)

## 2018-11-01 MED ORDER — MORPHINE SULFATE (PF) 4 MG/ML IV SOLN
4.0000 mg | INTRAVENOUS | Status: DC | PRN
  Administered 2018-11-01 (×2): 4 mg via INTRAVENOUS
  Filled 2018-11-01 (×2): qty 1

## 2018-11-01 MED ORDER — KETOROLAC TROMETHAMINE 30 MG/ML IJ SOLN
15.0000 mg | Freq: Once | INTRAMUSCULAR | Status: AC
  Administered 2018-11-01: 15 mg via INTRAVENOUS
  Filled 2018-11-01: qty 1

## 2018-11-01 MED ORDER — SODIUM CHLORIDE 0.9 % IV BOLUS: 500.0000 mL | Freq: Once | INTRAVENOUS | Status: DC

## 2018-11-01 MED ORDER — SODIUM CHLORIDE 0.9 % IV SOLN
1.0000 g | Freq: Once | INTRAVENOUS | Status: AC
  Administered 2018-11-01: 1 g via INTRAVENOUS
  Filled 2018-11-01: qty 10

## 2018-11-01 MED ORDER — OXYCODONE-ACETAMINOPHEN 5-325 MG PO TABS: 1.0000 | ORAL_TABLET | ORAL | 0 refills | Status: DC | PRN

## 2018-11-01 MED ORDER — CEPHALEXIN 500 MG PO CAPS: 500.0000 mg | ORAL_CAPSULE | Freq: Two times a day (BID) | ORAL | 0 refills | Status: DC

## 2018-11-01 MED ORDER — PROMETHAZINE HCL 25 MG/ML IJ SOLN: 12.5000 mg | Freq: Four times a day (QID) | INTRAMUSCULAR | Status: DC | PRN

## 2018-11-01 NOTE — ED Triage Notes (Signed)
Pt presents to ED with right flank pain and back pressure. Onset 0230 and has gotten progressively worse. Stent placed Friday for a 52mm stone. Pt very restless at this time. Noticed blood clots in urine this morning.

## 2018-11-01 NOTE — ED Notes (Signed)
Patient transported to CT 

## 2018-11-01 NOTE — ED Provider Notes (Signed)
[facility] Emergency Department Provider Note  Time seen: 7:40 AM  I have reviewed the triage vital signs and the nursing notes.   HISTORY  Chief Complaint Flank Pain    HPI William Frey is a 47 y.o. male with a past medical history of anxiety, diabetes, gastric reflux, hypertension, hyperlipidemia, kidney stones, presents to the emergency department for right flank pain.  According to the patient he had a large kidney stone recently, on Friday he had a lasering procedure as well as stent placement.  Patient states he has been experiencing intermittent right flank pain ever since this procedure however yesterday began to worsen significantly.  He also noted an increased amount of blood in his urine with occasional clots.  This morning he states the pain went to a 10 out of 10 was excruciating.  One episode of vomiting yesterday.  No fever.   Past Medical History:  Diagnosis Date  . Allergy   . Anxiety   . Arthritis   . Asthma   . Chicken pox   . Complication of anesthesia    difficult to wake up  . Diabetes mellitus without complication ([place])   . Family history of adverse reaction to anesthesia    father had low tolerance for anesthesia  . GERD (gastroesophageal reflux disease)   . History of kidney stones   . Hyperlipidemia   . Hypertension   . Low testosterone   . Migraines     Patient Active Problem List   Diagnosis Date Noted  . Screen for STD (sexually transmitted disease) 05/04/2018  . Anxiety 11/30/2017  . Cellulitis 06/25/2017  . Chest pain 11/15/2016  . Essential hypertension 11/15/2016  . Hyperlipidemia 12/23/2015  . Type 2 diabetes mellitus ([place]) 09/23/2015  . Low testosterone 09/23/2015  . Preventative health care 09/18/2015  . Obesity (BMI 30-39.9) 09/18/2015    Past Surgical History:  Procedure Laterality Date  . CYSTOSCOPY W/ RETROGRADES Right 10/28/2018   Procedure: CYSTOSCOPY WITH RETROGRADE PYELOGRAM;  Surgeon:  Billey Co, MD;  Location: [facility] ORS;  Service: Urology;  Laterality: Right;  . CYSTOSCOPY/URETEROSCOPY/HOLMIUM LASER/STENT PLACEMENT Right 10/28/2018   Procedure: CYSTOSCOPY/URETEROSCOPY/HOLMIUM LASER/STENT PLACEMENT;  Surgeon: Billey Co, MD;  Location: [facility] ORS;  Service: Urology;  Laterality: Right;  . OSTECTOMY METATARSAL Right 2003    Prior to Admission medications   Medication Sig Start Date End Date Taking? Authorizing Provider  ANDROGEL PUMP 20.25 MG/ACT (1.62%) GEL Apply 4 Doses topically daily. Use 4 pumps (2 on each shoulder) daily. 10/20/18  Yes Billey Co, MD  HYDROcodone-acetaminophen (NORCO/VICODIN) 5-325 MG tablet Take 1 tablet by mouth every 4 (four) hours as needed for up to 7 days for moderate pain. 10/28/18 11/04/18 Yes Billey Co, MD  metFORMIN (GLUCOPHAGE XR) 500 MG 24 hr tablet Take 2 tablets (1,000 mg total) by mouth daily with breakfast. 05/05/18  Yes Leone Haven, MD  tamsulosin (FLOMAX) 0.4 MG CAPS capsule Take 1 capsule (0.4 mg total) by mouth daily. 09/29/18  Yes Guse, Jacquelynn Cree, FNP  tolterodine (DETROL LA) 4 MG 24 hr capsule Take 1 capsule (4 mg total) by mouth daily for 14 days. TAKE ONE TABLET AS NEEDED DAILY FOR BLADDER SPASMS/PAIN 10/28/18 11/11/18 Yes Billey Co, MD  glucose blood test strip 1 each by Other route as needed for other (true matrix glucose test strips. E11.9). Use as instructed    [provider]  Lancets [facility] ULTRASOFT) lancets Use as instructed 09/23/15   Lacinda Axon,  Jayce G, DO  nitroGLYCERIN (NITROSTAT) 0.4 MG SL tablet Place 1 tablet (0.4 mg total) under the tongue every 5 (five) minutes as needed for chest pain. 11/13/16 10/21/18  End, Harrell Gave, MD  sulfamethoxazole-trimethoprim (BACTRIM DS,SEPTRA DS) 800-160 MG tablet Take 1 tablet by mouth 2 (two) times daily. Start taking 2 days prior to stent removal to prevent infection Patient not taking: Reported on 11/01/2018 10/28/18   Billey Co, MD     Allergies  Allergen Reactions  . Penicillins Hives    Childhood allergy Has patient had a PCN reaction causing immediate rash, facial/tongue/throat swelling, SOB or lightheadedness with hypotension: No Has patient had a PCN reaction causing severe rash involving mucus membranes or skin necrosis: No Has patient had a PCN reaction that required hospitalization: No Has patient had a PCN reaction occurring within the last 10 years: No If all of the above answers are "NO", then may proceed with Cephalosporin use.     Family History  Problem Relation Age of Onset  . Arthritis Mother   . Cancer Mother        breast and bone  . Heart Problems Mother   . Arthritis Father   . Lymphoma Father   . Heart disease Father   . Stroke Father   . Hypertension Father   . Clotting disorder Father   . Cancer Brother        lung  . Heart disease Brother        atriall fibrillation  . Diabetes Brother   . Heart disease Brother        atrial fibrillation  . Diabetes Maternal Aunt   . Diabetes Maternal Aunt     Social History Social History   Tobacco Use  . Smoking status: Former Smoker    Packs/day: 2.00    Years: 25.00    Pack years: 50.00    Types: Cigarettes    Last attempt to quit: 09/17/2013    Years since quitting: 5.1  . Smokeless tobacco: Never Used  Substance Use Topics  . Alcohol use: No    Alcohol/week: 0.0 standard drinks    Comment: former  . Drug use: No    Review of Systems Constitutional: Negative for fever. Cardiovascular: Negative for chest pain. Respiratory: Negative for shortness of breath. Gastrointestinal: Severe 10/10 sharp right flank pain.  One episode of vomiting.  No diarrhea. Genitourinary: Hematuria with occasional clot. Musculoskeletal: Negative for musculoskeletal complaints Skin: Negative for skin complaints  Neurological: Negative for headache All other ROS negative  ____________________________________________   PHYSICAL EXAM:  VITAL  SIGNS: ED Triage Vitals  Enc Vitals Group     BP 11/01/18 0626 (!) 160/99     Pulse Rate 11/01/18 0626 71     Resp 11/01/18 0626 (!) 22     Temp 11/01/18 0626 97.7 F (36.5 C)     Temp src --      SpO2 11/01/18 0626 97 %     Weight 11/01/18 0631 253 lb 8.5 oz (115 kg)     Height 11/01/18 0631 6\' 2"  (1.88 m)     Head Circumference --      Peak Flow --      Pain Score 11/01/18 0631 10     Pain Loc --      Pain Edu? --      Excl. in [place]? --     Constitutional: Alert and oriented. Well appearing and in no distress. Eyes: Normal exam ENT   Head: Normocephalic  and atraumatic   Mouth/Throat: Mucous membranes are moist. Cardiovascular: Normal rate, regular rhythm. Respiratory: Normal respiratory effort without tachypnea nor retractions. Breath sounds are clear Gastrointestinal: Soft, mild right lower quadrant tenderness palpation.  No CVA tenderness. Musculoskeletal: Nontender with normal range of motion in all extremities.  Neurologic:  Normal speech and language. No gross focal neurologic deficits are appreciated. Skin:  Skin is warm, dry and intact.  Psychiatric: Mood and affect are normal.   ____________________________________________   RADIOLOGY   IMPRESSION: 1. Double-J stent extends from the right renal pelvis to the bladder. There are calculi within the right kidney, largest in the right renal pelvis measuring 1.2 x 1.0 cm. There remains mild to moderate hydronephrosis on the right with perinephric edema on the right. Multiple prostatic calculi noted.  2. Scattered colonic diverticula without diverticulitis. No bowel obstruction. No abscess in the abdomen pelvis. Appendix appears normal.  3. Hepatic steatosis.  4. Scattered subcentimeter mesenteric lymph nodes, regarded as nonspecific. No adenopathy by size criteria evident.  ____________________________________________   INITIAL IMPRESSION / ASSESSMENT AND PLAN / ED COURSE  Pertinent labs & imaging  results that were available during my care of the patient were reviewed by me and considered in my medical decision making (see chart for details).  Patient presents for acute worsening of right flank pain 4 days after having a lasering procedure and ureteral stent placed.  Differential would include obstructed stent, obstructive uropathy, urinary tract infection, pyelonephritis, postoperative pain.  Patient's blood work is largely within normal limits.  Patient's urinalysis however shows too numerous to count red and white blood cells with occasional bacteria.  We will cover with IV Rocephin.  We will send urine culture.  Given the patient's increased and flank pain we will obtain a repeat CT scan.  We will likely discuss with urology once results are known.  I discussed the patient with Dr. Gloriann Loan of urology.  He reviewed the CT images, believe the patient is safe for discharge home.  Given the white cells and mild amount of bacteria in the urine I will cover with Keflex going home.  We will dose Percocet going home as well.  Patient agreeable to plan of care.  We will follow-up with urology.  ____________________________________________   FINAL CLINICAL IMPRESSION(S) / ED DIAGNOSES  Right flank pain   Harvest Dark, MD 11/01/18 [phone]

## 2018-11-01 NOTE — ED Notes (Signed)
Pt given water at this time 

## 2018-11-01 NOTE — ED Notes (Signed)
Bladder scanned, max of 44ml seen.

## 2018-11-03 ENCOUNTER — Ambulatory Visit (INDEPENDENT_AMBULATORY_CARE_PROVIDER_SITE_OTHER): Payer: 59 | Admitting: Urology

## 2018-11-03 ENCOUNTER — Encounter: Payer: Self-pay | Admitting: Urology

## 2018-11-03 VITALS — BP 126/82 | HR 91

## 2018-11-03 DIAGNOSIS — N200 Calculus of kidney: Secondary | ICD-10-CM | POA: Diagnosis not present

## 2018-11-03 DIAGNOSIS — N3001 Acute cystitis with hematuria: Secondary | ICD-10-CM

## 2018-11-03 LAB — URINE CULTURE: Culture: >=100000 — AB

## 2018-11-03 MED ORDER — KETOROLAC TROMETHAMINE 10 MG PO TABS: 10.0000 mg | ORAL_TABLET | Freq: Four times a day (QID) | ORAL | 0 refills | Status: DC | PRN

## 2018-11-03 MED ORDER — SULFAMETHOXAZOLE-TRIMETHOPRIM 800-160 MG PO TABS: 1.0000 | ORAL_TABLET | Freq: Two times a day (BID) | ORAL | 0 refills | Status: DC

## 2018-11-03 MED ORDER — DIAZEPAM 5 MG PO TABS: 5.0000 mg | ORAL_TABLET | Freq: Once | ORAL | 0 refills | Status: DC | PRN

## 2018-11-03 NOTE — Progress Notes (Signed)
   11/03/2018 5:06 PM   William Frey 29-Sep-1971 [id number]  Reason for visit: Malaise   HPI: I saw William Frey in urology clinic for symptoms after right ureteroscopy, laser lithotripsy, stent placement for a 1.8 cm renal pelvis stone on 10/28/2018. He notes intermittent right sided flank pain consistent with his right sided stent.  He is also noted some chills, malaise, and nausea at home.  He has not been on antibiotics.  He was seen in the emergency department for the above symptoms on 12/24 and urine culture ultimately grew greater than 100 K of Staphylococcus epidermidis.  He denies any fevers at home.  He has had some intermittent pink urine, but no clots.   Laboratory Data: Reviewed  Pertinent Imaging: I have personally reviewed the postop CT from 12/24.  Stent in appropriate position.  There is approximately 1 cm of stone in the lower pole, though I suspect this is primarily dust and small fragments secondary to his significant stone burden.  Assessment & Plan:   In summary, William Frey is a 47 year old male status post right ureteroscopy, laser lithotripsy, and stent placement on 10/28/2018 for a large 1.8 cm right renal pelvis stone.  He has symptoms consistent with a postoperative UTI, as well as, and stent related symptoms including pain, frequency, and dysuria.  10-day course of Bactrim DS twice daily Recommended Aleve and ibuprofen for stent related symptoms Toradol as needed for severe pain refractory to above Keep scheduled follow-up for stent removal next week  A total of 10 minutes were spent face-to-face with the patient, greater than 50% was spent in patient education, counseling, and coordination of care regarding postoperative expectations, hematuria, urinary tract infection, and treatment plan.  Billey Co, [facility] [address] [place], [place] [address] [phone]

## 2018-11-07 ENCOUNTER — Other Ambulatory Visit: Payer: Self-pay | Admitting: Family Medicine

## 2018-11-10 ENCOUNTER — Encounter: Payer: Self-pay | Admitting: Urology

## 2018-11-10 ENCOUNTER — Ambulatory Visit (INDEPENDENT_AMBULATORY_CARE_PROVIDER_SITE_OTHER): Payer: 59 | Admitting: Urology

## 2018-11-10 VITALS — BP 125/74 | HR 77 | Ht 74.0 in | Wt 249.0 lb

## 2018-11-10 DIAGNOSIS — N200 Calculus of kidney: Secondary | ICD-10-CM

## 2018-11-10 LAB — MICROSCOPIC EXAMINATION: RBC, UA: >30 /hpf — ABNORMAL HIGH (ref 0–2)

## 2018-11-10 LAB — URINALYSIS, COMPLETE
BILIRUBIN UA: NEGATIVE
Glucose, UA: NEGATIVE
Ketones, UA: NEGATIVE
Nitrite, UA: NEGATIVE
PH UA: 5 (ref 5.0–7.5)
Specific Gravity, UA: >1.03 — ABNORMAL HIGH (ref 1.005–1.030)
UUROB: 0.2 mg/dL (ref 0.2–1.0)

## 2018-11-10 NOTE — Progress Notes (Signed)
Cystoscopy Procedure Note:  Indication: Stent removal s/p 10/28/2018 RIGHT URS/LL/stent for 1.8cm renal pelvis stone, complicated by post-op UTI treated with culture appropriate Bactrim  After informed consent and discussion of the procedure and its risks, PHU RECORD was positioned and prepped in the standard fashion. Cystoscopy was performed with a flexible cystoscope. The stent was grasped with flexible graspers and removed in its entirety. The patient tolerated the procedure well.  Findings: Uncomplicated stent removal  CT imaging reviewed, lower pole stone burden consistent with dust fragments post URS  Assessment and Plan: KUB in 6 months  We discussed general stone prevention strategies including adequate hydration with goal of producing 2.5 L of urine daily, increasing citric acid intake, increasing calcium intake during high oxalate meals, minimizing animal protein, and decreasing salt intake. Information about dietary recommendations given today.    Billey Co, MD 11/10/2018

## 2018-11-11 ENCOUNTER — Encounter: Payer: Self-pay | Admitting: Family Medicine

## 2018-11-11 ENCOUNTER — Ambulatory Visit: Payer: 59 | Admitting: Family Medicine

## 2018-11-11 VITALS — BP 120/84 | HR 69 | Temp 98.1°F | Ht 74.0 in | Wt 252.4 lb

## 2018-11-11 DIAGNOSIS — N200 Calculus of kidney: Secondary | ICD-10-CM

## 2018-11-11 DIAGNOSIS — R809 Proteinuria, unspecified: Secondary | ICD-10-CM | POA: Diagnosis not present

## 2018-11-11 DIAGNOSIS — E785 Hyperlipidemia, unspecified: Secondary | ICD-10-CM

## 2018-11-11 DIAGNOSIS — E119 Type 2 diabetes mellitus without complications: Secondary | ICD-10-CM | POA: Diagnosis not present

## 2018-11-11 NOTE — Patient Instructions (Addendum)
Dietary Guidelines to Help Prevent Kidney Stones Kidney stones are deposits of minerals and salts that form inside your kidneys. Your risk of developing kidney stones may be greater depending on your diet, your lifestyle, the medicines you take, and whether you have certain medical conditions. Most people can reduce their chances of developing kidney stones by following the instructions below. Depending on your overall health and the type of kidney stones you tend to develop, your dietitian may give you more specific instructions. What are tips for following this plan? Reading food labels  Choose foods with "no salt added" or "low-salt" labels. Limit your sodium intake to less than 1500 mg per day.  Choose foods with calcium for each meal and snack. Try to eat about 300 mg of calcium at each meal. Foods that contain 200-500 mg of calcium per serving include: ? 8 oz (237 ml) of milk, fortified nondairy milk, and fortified fruit juice. ? 8 oz (237 ml) of kefir, yogurt, and soy yogurt. ? 4 oz (118 ml) of tofu. ? 1 oz of cheese. ? 1 cup (300 g) of dried figs. ? 1 cup (91 g) of cooked broccoli. ? 1-3 oz can of sardines or mackerel.  Most people need 1000 to 1500 mg of calcium each day. Talk to your dietitian about how much calcium is recommended for you. Shopping  Buy plenty of fresh fruits and vegetables. Most people do not need to avoid fruits and vegetables, even if they contain nutrients that may contribute to kidney stones.  When shopping for convenience foods, choose: ? Whole pieces of fruit. ? Premade salads with dressing on the side. ? Low-fat fruit and yogurt smoothies.  Avoid buying frozen meals or prepared deli foods.  Look for foods with live cultures, such as yogurt and kefir. Cooking  Do not add salt to food when cooking. Place a salt shaker on the table and allow each person to add his or her own salt to taste.  Use vegetable protein, such as beans, textured vegetable  protein (TVP), or tofu instead of meat in pasta, casseroles, and soups. Meal planning   Eat less salt, if told by your dietitian. To do this: ? Avoid eating processed or premade food. ? Avoid eating fast food.  Eat less animal protein, including cheese, meat, poultry, or fish, if told by your dietitian. To do this: ? Limit the number of times you have meat, poultry, fish, or cheese each week. Eat a diet free of meat at least 2 days a week. ? Eat only one serving each day of meat, poultry, fish, or seafood. ? When you prepare animal protein, cut pieces into small portion sizes. For most meat and fish, one serving is about the size of one deck of cards.  Eat at least 5 servings of fresh fruits and vegetables each day. To do this: ? Keep fruits and vegetables on hand for snacks. ? Eat 1 piece of fruit or a handful of berries with breakfast. ? Have a salad and fruit at lunch. ? Have two kinds of vegetables at dinner.  Limit foods that are high in a substance called oxalate. These include: ? Spinach. ? Rhubarb. ? Beets. ? Potato chips and french fries. ? Nuts.  If you regularly take a diuretic medicine, make sure to eat at least 1-2 fruits or vegetables high in potassium each day. These include: ? Avocado. ? Banana. ? Orange, prune, carrot, or tomato juice. ? Baked potato. ? Cabbage. ? Beans and split   peas. General instructions   Drink enough fluid to keep your urine clear or pale yellow. This is the most important thing you can do.  Talk to your health care provider and dietitian about taking daily supplements. Depending on your health and the cause of your kidney stones, you may be advised: ? Not to take supplements with vitamin C. ? To take a calcium supplement. ? To take a daily probiotic supplement. ? To take other supplements such as magnesium, fish oil, or vitamin B6.  Take all medicines and supplements as told by your health care provider.  Limit alcohol intake to no  more than 1 drink a day for nonpregnant women and 2 drinks a day for men. One drink equals 12 oz of beer, 5 oz of wine, or 1 oz of hard liquor.  Lose weight if told by your health care provider. Work with your dietitian to find strategies and an eating plan that works best for you. What foods are not recommended? Limit your intake of the following foods, or as told by your dietitian. Talk to your dietitian about specific foods you should avoid based on the type of kidney stones and your overall health. Grains Breads. Bagels. Rolls. Baked goods. Salted crackers. Cereal. Pasta. Vegetables Spinach. Rhubarb. Beets. Canned vegetables. Angie Fava. Olives. Meats and other protein foods Nuts. Nut butters. Large portions of meat, poultry, or fish. Salted or cured meats. Deli meats. Hot dogs. Sausages. Dairy Cheese. Beverages Regular soft drinks. Regular vegetable juice. Seasonings and other foods Seasoning blends with salt. Salad dressings. Canned soups. Soy sauce. Ketchup. Barbecue sauce. Canned pasta sauce. Casseroles. Pizza. Lasagna. Frozen meals. Potato chips. [place] fries. Summary  You can reduce your risk of kidney stones by making changes to your diet.  The most important thing you can do is drink enough fluid. You should drink enough fluid to keep your urine clear or pale yellow.  Ask your health care provider or dietitian how much protein from animal sources you should eat each day, and also how much salt and calcium you should have each day. This information is not intended to replace advice given to you by your health care provider. Make sure you discuss any questions you have with your health care provider. Document Released: 02/20/2011 Document Revised: 10/06/2016 Document Reviewed: 10/06/2016 Elsevier Interactive Patient Education  2019 [place] to see you. Please try to take your metformin consistently.  We will get you to see an endocrinologist. Please try to make  healthy choices when you do have the eat out.  Please try to increase her vegetable intake. Please try to follow dietary guidelines above to help with your kidney stones.

## 2018-11-11 NOTE — Progress Notes (Signed)
Tommi Rumps, MD Phone: [phone]  William Frey is a 48 y.o. male who presents today for follow-up.  CC: Kidney stone, diabetes, hyperlipidemia  Kidney stone: Patient recently treated by urology for this.  He had a stent removed yesterday.  He developed a urinary tract infection after placement of his stent and was treated with Bactrim.  He has completed this.  He feels significantly improved.  He did pass numerous small stones last night.  Urinalysis was noted to have protein on it previously.  Diabetes: More recently has been running around 150.  He has been up to 300 in the evenings.  He has not consistently been taking his metformin for several weeks.  No polyuria or polydipsia.  No hypoglycemia.  He has not seen ophthalmology recently.  He does have an ophthalmologist he can schedule an appointment with.  He is requesting an endocrinology referral.  Hyperlipidemia: No chest pain.  He only drinks water.  He does like meat and potatoes as well as bread and fried food.  Not many salads or vegetables.  He does not do specific exercise though stays active with his job.  Social History   Tobacco Use  Smoking Status Former Smoker  . Packs/day: 2.00  . Years: 25.00  . Pack years: 50.00  . Types: Cigarettes  . Last attempt to quit: 09/17/2013  . Years since quitting: 5.1  Smokeless Tobacco Never Used     ROS see history of present illness  Objective  Physical Exam Vitals:   11/11/18 1026  BP: 120/84  Pulse: 69  Temp: 98.1 F (36.7 C)  SpO2: 98%    BP Readings from Last 3 Encounters:  11/11/18 120/84  11/10/18 125/74  11/03/18 126/82   Wt Readings from Last 3 Encounters:  11/11/18 252 lb 6.4 oz (114.5 kg)  11/10/18 249 lb (112.9 kg)  11/01/18 253 lb 8.5 oz (115 kg)    Physical Exam Constitutional:      General: He is not in acute distress.    Appearance: He is not diaphoretic.  Cardiovascular:     Rate and Rhythm: Normal rate and regular rhythm.   Heart sounds: Normal heart sounds.  Pulmonary:     Effort: Pulmonary effort is normal.     Breath sounds: Normal breath sounds.  Skin:    General: Skin is warm and dry.  Neurological:     Mental Status: He is alert.      Assessment/Plan: Please see individual problem list.  Type 2 diabetes mellitus ([place]) His diabetes control has likely worsened due to medication noncompliance and diet.  He does have a difficult time with his diet given his work schedule.  I encouraged him to try as hard as he can on this.  I encouraged him to take his metformin.  I have placed a referral to endocrinology at his request.  Hyperlipidemia I encouraged diet and exercise.  Nephrolithiasis Patient is following with urology.  He will continue to periodically see them.  He was given dietary information for people with kidney stones.  Proteinuria Undetermined whether or not this is related to his kidney stones or diabetes.  He will return in 1 month for repeat testing.    Orders Placed This Encounter  Procedures  . Ambulatory referral to Endocrinology    Referral Priority:   Routine    Referral Type:   Consultation    Referral Reason:   Specialty Services Required    Number of Visits Requested:   1  .  POCT Urinalysis Dipstick    Standing Status:   Future    Standing Expiration Date:   01/10/2019    No orders of the defined types were placed in this encounter.    Tommi Rumps, MD [place]

## 2018-11-12 DIAGNOSIS — N200 Calculus of kidney: Secondary | ICD-10-CM | POA: Insufficient documentation

## 2018-11-12 DIAGNOSIS — R809 Proteinuria, unspecified: Secondary | ICD-10-CM | POA: Insufficient documentation

## 2018-11-12 HISTORY — DX: Calculus of kidney: N20.0

## 2018-11-12 NOTE — Assessment & Plan Note (Signed)
His diabetes control has likely worsened due to medication noncompliance and diet.  He does have a difficult time with his diet given his work schedule.  I encouraged him to try as hard as he can on this.  I encouraged him to take his metformin.  I have placed a referral to endocrinology at his request.

## 2018-11-12 NOTE — Assessment & Plan Note (Addendum)
I encouraged diet and exercise.

## 2018-11-12 NOTE — Assessment & Plan Note (Addendum)
Undetermined whether or not this is related to his kidney stones or diabetes.  He will return in 1 month for repeat testing.

## 2018-11-12 NOTE — Assessment & Plan Note (Signed)
Patient is following with urology.  He will continue to periodically see them.  He was given dietary information for people with kidney stones.

## 2018-12-12 ENCOUNTER — Other Ambulatory Visit: Payer: Self-pay

## 2019-03-01 ENCOUNTER — Ambulatory Visit: Payer: Self-pay | Admitting: Family Medicine

## 2019-03-14 ENCOUNTER — Telehealth: Payer: Self-pay | Admitting: Radiology

## 2019-03-14 NOTE — Telephone Encounter (Signed)
Unable to reach patient to schedule Return in about 6 months (around 05/11/2019) for kub with Dr Diamantina Providence.

## 2019-04-19 ENCOUNTER — Other Ambulatory Visit: Payer: Self-pay

## 2019-04-19 DIAGNOSIS — R7989 Other specified abnormal findings of blood chemistry: Secondary | ICD-10-CM

## 2019-04-20 MED ORDER — ANDROGEL PUMP 20.25 MG/ACT (1.62%) TD GEL: 4.0000 | Freq: Every day | TRANSDERMAL | 5 refills | Status: DC

## 2019-05-01 DIAGNOSIS — L239 Allergic contact dermatitis, unspecified cause: Secondary | ICD-10-CM | POA: Diagnosis not present

## 2019-07-11 DIAGNOSIS — Z7984 Long term (current) use of oral hypoglycemic drugs: Secondary | ICD-10-CM | POA: Diagnosis not present

## 2019-07-11 DIAGNOSIS — H5213 Myopia, bilateral: Secondary | ICD-10-CM | POA: Diagnosis not present

## 2019-07-11 DIAGNOSIS — H524 Presbyopia: Secondary | ICD-10-CM | POA: Diagnosis not present

## 2019-07-11 DIAGNOSIS — E119 Type 2 diabetes mellitus without complications: Secondary | ICD-10-CM | POA: Diagnosis not present

## 2019-07-11 DIAGNOSIS — H52223 Regular astigmatism, bilateral: Secondary | ICD-10-CM | POA: Diagnosis not present

## 2019-07-11 LAB — HM DIABETES EYE EXAM

## 2019-09-18 ENCOUNTER — Telehealth: Payer: Self-pay

## 2019-09-18 ENCOUNTER — Other Ambulatory Visit: Payer: Self-pay | Admitting: Family Medicine

## 2019-09-18 NOTE — Telephone Encounter (Signed)
Copied from [place] [id number]. Topic: Appointment Scheduling - Scheduling Inquiry for Clinic >> Sep 18, 2019 10:25 AM Lennox Solders wrote: reason for CRM: Pt wife is calling and her husband needs cpe by end of nov 2020. Pt last cpe was with dr Lacinda Axon 2018. Dr Bennetta Laos is his doctor

## 2019-09-21 ENCOUNTER — Other Ambulatory Visit: Payer: Self-pay

## 2019-09-21 ENCOUNTER — Encounter: Payer: Self-pay | Admitting: Family Medicine

## 2019-09-21 ENCOUNTER — Ambulatory Visit (INDEPENDENT_AMBULATORY_CARE_PROVIDER_SITE_OTHER): Payer: 59 | Admitting: Family Medicine

## 2019-09-21 VITALS — BP 138/94 | HR 75 | Temp 96.7°F | Ht 73.5 in | Wt 256.8 lb

## 2019-09-21 DIAGNOSIS — E119 Type 2 diabetes mellitus without complications: Secondary | ICD-10-CM

## 2019-09-21 DIAGNOSIS — Z0000 Encounter for general adult medical examination without abnormal findings: Secondary | ICD-10-CM | POA: Diagnosis not present

## 2019-09-21 LAB — COMPREHENSIVE METABOLIC PANEL
ALT: 14 U/L (ref 0–53)
AST: 12 U/L (ref 0–37)
Albumin: 4.2 g/dL (ref 3.5–5.2)
Alkaline Phosphatase: 49 U/L (ref 39–117)
BUN: 18 mg/dL (ref 6–23)
CO2: 25 mEq/L (ref 19–32)
Calcium: 9 mg/dL (ref 8.4–10.5)
Chloride: 103 mEq/L (ref 96–112)
Creatinine, Ser: 1.15 mg/dL (ref 0.40–1.50)
GFR: 68.08 mL/min (ref >60.00)
Glucose, Bld: 166 mg/dL — ABNORMAL HIGH (ref 70–99)
Potassium: 4.3 mEq/L (ref 3.5–5.1)
Sodium: 135 mEq/L (ref 135–145)
Total Bilirubin: 0.5 mg/dL (ref 0.2–1.2)
Total Protein: 7.1 g/dL (ref 6.0–8.3)

## 2019-09-21 LAB — LIPID PANEL
Cholesterol: 207 mg/dL — ABNORMAL HIGH (ref 0–200)
HDL: 35.4 mg/dL — ABNORMAL LOW (ref >39.00)
NonHDL: 171.95
Total CHOL/HDL Ratio: 6
Triglycerides: 227 mg/dL — ABNORMAL HIGH (ref 0.0–149.0)
VLDL: 45.4 mg/dL — ABNORMAL HIGH (ref 0.0–40.0)

## 2019-09-21 LAB — CBC
HCT: 42.3 % (ref 39.0–52.0)
Hemoglobin: 14.2 g/dL (ref 13.0–17.0)
MCHC: 33.5 g/dL (ref 30.0–36.0)
MCV: 90.2 fl (ref 78.0–100.0)
Platelets: 189 10*3/uL (ref 150.0–400.0)
RBC: 4.69 Mil/uL (ref 4.22–5.81)
RDW: 12.5 % (ref 11.5–15.5)
WBC: 6.5 10*3/uL (ref 4.0–10.5)

## 2019-09-21 LAB — LDL CHOLESTEROL, DIRECT: Direct LDL: 124 mg/dL

## 2019-09-21 LAB — TSH: TSH: 1.71 u[IU]/mL (ref 0.35–4.50)

## 2019-09-21 MED ORDER — BLOOD GLUCOSE METER KIT: PACK | 0 refills | Status: DC

## 2019-09-21 MED ORDER — METFORMIN HCL ER 500 MG PO TB24: ORAL_TABLET | ORAL | 1 refills | Status: DC

## 2019-09-21 NOTE — Progress Notes (Signed)
Subjective:    Patient ID: William Frey, male    DOB: 01/16/1971, 48 y.o.   MRN: [id number]  HPI   Patient presents to clinic for annual physical exam.  Overall he is doing well.  Does note some additional stress in his life currently and will lose his medical coverage at the end of this month.  This is why he wanted to get in before his insurance is no longer.  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  Also would like refill on Metformin before insurance policy ends.  Sees eye doctor and dentist regularly.  Last eye doctor about 3 weeks ago.  Tries to follow healthy diet and regular physical activity.  Family History  Problem Relation Age of Onset  . Arthritis Mother   . Cancer Mother        breast and bone  . Heart Problems Mother   . Arthritis Father   . Lymphoma Father   . Heart disease Father   . Stroke Father   . Hypertension Father   . Clotting disorder Father   . Cancer Brother        lung  . Heart disease Brother        atriall fibrillation  . Diabetes Brother   . Heart disease Brother        atrial fibrillation  . Diabetes Maternal Aunt   . Diabetes Maternal Aunt    Patient Active Problem List   Diagnosis Date Noted  . Nephrolithiasis 11/12/2018  . Proteinuria 11/12/2018  . Screen for STD (sexually transmitted disease) 05/04/2018  . Anxiety 11/30/2017  . Chest pain 11/15/2016  . Essential hypertension 11/15/2016  . Hyperlipidemia 12/23/2015  . Type 2 diabetes mellitus ([place]) 09/23/2015  . Low testosterone 09/23/2015  . Preventative health care 09/18/2015  . Obesity (BMI 30-39.9) 09/18/2015   Social History   Tobacco Use  . Smoking status: Former Smoker    Packs/day: 2.00    Years: 25.00    Pack years: 50.00    Types: Cigarettes    Quit date: 09/17/2013    Years since quitting: 6.0  . Smokeless tobacco: Never Used  Substance Use  Topics  . Alcohol use: No    Alcohol/week: 0.0 standard drinks    Comment: former   Review of Systems  Constitutional: Negative for chills, fatigue and fever.  HENT: Negative for congestion, ear pain, sinus pain and sore throat.   Eyes: Negative.   Respiratory: Negative for cough, shortness of breath and wheezing.   Cardiovascular: Negative for chest pain, palpitations and leg swelling.  Gastrointestinal: Negative for abdominal pain, diarrhea, nausea and vomiting.  Genitourinary: Negative for dysuria, frequency and urgency.  Musculoskeletal: Negative for arthralgias and myalgias.  Skin: Negative for color change, pallor and rash.  Neurological: Negative for syncope, light-headedness and headaches.  Psychiatric/Behavioral: The patient is not nervous/anxious. +added stress in life     Objective:   Physical Exam Vitals signs and nursing note reviewed.  Constitutional:      General: He is not in acute distress.    Appearance: He is not toxic-appearing.  HENT:     Head: Normocephalic and atraumatic.     Right Ear: Tympanic membrane, ear canal and external ear normal.     Left Ear: Tympanic membrane, ear canal and external ear normal.  Eyes:     General: No scleral  icterus.    Extraocular Movements: Extraocular movements intact.     Conjunctiva/sclera: Conjunctivae normal.     Pupils: Pupils are equal, round, and reactive to light.  Neck:     Musculoskeletal: Normal range of motion and neck supple. No neck rigidity.     Vascular: No carotid bruit.  Cardiovascular:     Rate and Rhythm: Normal rate and regular rhythm.     Heart sounds: Normal heart sounds.  Pulmonary:     Effort: Pulmonary effort is normal. No respiratory distress.     Breath sounds: Normal breath sounds.  Abdominal:     General: Bowel sounds are normal. There is no distension.     Palpations: Abdomen is soft.     Tenderness: There is no abdominal tenderness. There is no right CVA tenderness, left CVA  tenderness, guarding or rebound.  Musculoskeletal: Normal range of motion.     Right lower leg: No edema.     Left lower leg: No edema.  Skin:    General: Skin is warm and dry.     Capillary Refill: Capillary refill takes less than 2 seconds.  Neurological:     Mental Status: He is alert and oriented to person, place, and time. Mental status is at baseline.     Gait: Gait normal.  Psychiatric:        Mood and Affect: Mood normal.        Behavior: Behavior normal.     Today's Vitals   09/21/19 0914  BP: (!) 138/94  Pulse: 75  Temp: (!) 96.7 F (35.9 C)  TempSrc: Temporal  SpO2: 97%  Weight: 256 lb 12.8 oz (116.5 kg)  Height: 6' 1.5" (1.867 m)   Body mass index is 33.42 kg/m.     Assessment & Plan:    1. Well adult health check Patient will get annual lab work.  Reviewed healthy diet and regular physical activity, recommended balanced diet with lean proteins, various fruits vegetables whole grains and good water intake.  Sees eye doctor and dentist regularly.  Reviewed safe sun practices using SPF of at least 61 when outdoors.   - CBC - Comp Met (CMET) - Lipid panel - TSH  2. Type 2 diabetes mellitus without complication, without long-term current use of insulin (HCC)  - metFORMIN (GLUCOPHAGE-XR) 500 MG 24 hr tablet; TAKE 2 TABLETS BY MOUTH DAILY WITH BREAKFAST  Dispense: 180 tablet; Refill: 1 - blood glucose meter kit and supplies; Dispense based on patient and insurance preference. Use up to four times daily as directed. (FOR ICD-10 E10.9, E11.9).  Dispense: 1 each; Refill: 0   Patient will follow-up annually and whenever needed.

## 2019-09-22 ENCOUNTER — Other Ambulatory Visit: Payer: Self-pay | Admitting: Family Medicine

## 2019-09-22 DIAGNOSIS — E119 Type 2 diabetes mellitus without complications: Secondary | ICD-10-CM

## 2019-09-22 NOTE — Progress Notes (Signed)
a1c order

## 2019-10-01 ENCOUNTER — Encounter: Payer: Self-pay | Admitting: Family Medicine

## 2020-06-28 ENCOUNTER — Other Ambulatory Visit: Payer: Self-pay

## 2020-06-28 ENCOUNTER — Encounter: Payer: Self-pay | Admitting: Family Medicine

## 2020-06-28 ENCOUNTER — Ambulatory Visit (INDEPENDENT_AMBULATORY_CARE_PROVIDER_SITE_OTHER): Payer: Self-pay | Admitting: Family Medicine

## 2020-06-28 VITALS — BP 120/80 | HR 78 | Temp 98.2°F | Ht 73.0 in | Wt 255.2 lb

## 2020-06-28 DIAGNOSIS — G629 Polyneuropathy, unspecified: Secondary | ICD-10-CM | POA: Insufficient documentation

## 2020-06-28 DIAGNOSIS — R7989 Other specified abnormal findings of blood chemistry: Secondary | ICD-10-CM

## 2020-06-28 DIAGNOSIS — E291 Testicular hypofunction: Secondary | ICD-10-CM

## 2020-06-28 DIAGNOSIS — E785 Hyperlipidemia, unspecified: Secondary | ICD-10-CM

## 2020-06-28 DIAGNOSIS — I10 Essential (primary) hypertension: Secondary | ICD-10-CM

## 2020-06-28 DIAGNOSIS — E119 Type 2 diabetes mellitus without complications: Secondary | ICD-10-CM

## 2020-06-28 LAB — COMPREHENSIVE METABOLIC PANEL
ALT: 15 U/L (ref 0–53)
AST: 13 U/L (ref 0–37)
Albumin: 4.3 g/dL (ref 3.5–5.2)
Alkaline Phosphatase: 65 U/L (ref 39–117)
BUN: 16 mg/dL (ref 6–23)
CO2: 24 mEq/L (ref 19–32)
Calcium: 9.2 mg/dL (ref 8.4–10.5)
Chloride: 103 mEq/L (ref 96–112)
Creatinine, Ser: 1.17 mg/dL (ref 0.40–1.50)
GFR: 66.52 mL/min (ref >60.00)
Glucose, Bld: 180 mg/dL — ABNORMAL HIGH (ref 70–99)
Potassium: 4.3 mEq/L (ref 3.5–5.1)
Sodium: 136 mEq/L (ref 135–145)
Total Bilirubin: 0.4 mg/dL (ref 0.2–1.2)
Total Protein: 7.3 g/dL (ref 6.0–8.3)

## 2020-06-28 LAB — LIPID PANEL
Cholesterol: 183 mg/dL (ref 0–200)
HDL: 31.8 mg/dL — ABNORMAL LOW (ref >39.00)
NonHDL: 151.02
Total CHOL/HDL Ratio: 6
Triglycerides: 388 mg/dL — ABNORMAL HIGH (ref 0.0–149.0)
VLDL: 77.6 mg/dL — ABNORMAL HIGH (ref 0.0–40.0)

## 2020-06-28 LAB — MICROALBUMIN / CREATININE URINE RATIO
Creatinine,U: 202.4 mg/dL
Microalb Creat Ratio: 1.1 mg/g (ref 0.0–30.0)
Microalb, Ur: 2.2 mg/dL — ABNORMAL HIGH (ref 0.0–1.9)

## 2020-06-28 LAB — CBC
HCT: 42.4 % (ref 39.0–52.0)
Hemoglobin: 14.1 g/dL (ref 13.0–17.0)
MCHC: 33.3 g/dL (ref 30.0–36.0)
MCV: 90.1 fl (ref 78.0–100.0)
Platelets: 195 10*3/uL (ref 150.0–400.0)
RBC: 4.71 Mil/uL (ref 4.22–5.81)
RDW: 12.8 % (ref 11.5–15.5)
WBC: 6.7 10*3/uL (ref 4.0–10.5)

## 2020-06-28 LAB — HEMOGLOBIN A1C: Hgb A1c MFr Bld: 9.1 % — ABNORMAL HIGH (ref 4.6–6.5)

## 2020-06-28 LAB — TESTOSTERONE: Testosterone: 105.8 ng/dL — ABNORMAL LOW (ref 300.00–890.00)

## 2020-06-28 LAB — LDL CHOLESTEROL, DIRECT: Direct LDL: 81 mg/dL

## 2020-06-28 LAB — PSA: PSA: 1.95 ng/mL (ref 0.10–4.00)

## 2020-06-28 NOTE — Assessment & Plan Note (Signed)
Check testosterone, PSA, and CBC. Consider referral back to urology.

## 2020-06-28 NOTE — Progress Notes (Signed)
Tommi Rumps, MD Phone: [phone]  William Frey is a 49 y.o. male who presents today for f/u.  DIABETES Disease Monitoring: Blood Sugar ranges-180s-190s with excursions into the 200s polyuria/phagia/dipsia-no      Optho-up-to-date Medications: Compliance-taking Metformin though he does occasionally miss doses for 4 to 5 days at a time. Hypoglycemic symptoms-no. Patient has not been monitoring his diet.  HYPERTENSION  Disease Monitoring  Home BP Monitoring not checking chest pain-no    dyspnea-no Medications  Compliance-no medications.   Edema-no  Hypogonadism: Patient does not use his AndroGel consistently. He has not seen urology in over a year.  Neuropathy: Patient reports burning and numbness in his toes that he typically notes at night. Does not occur daily.  Patient expresses extensive concerns regarding the COVID-19 vaccine. Part of the reason is anxiety and fear of needles. He is also unsure of the vaccine given that it is being given out for free and that they require an ID to get the vaccine. He also wonders where it came from and if he knew exactly if it was manmade or if it naturally mutated he would consider getting the vaccine. He notes he does understand the purpose of the vaccine but also wonders why he would get it to help others as he could still potentially get Covid and spread it to others. He expresses lots of questions regarding the whole process of the vaccine and concerns that there could be some conspiracy underlying this pandemic and the vaccine.  Social History   Tobacco Use  Smoking Status Former Smoker  . Packs/day: 2.00  . Years: 25.00  . Pack years: 50.00  . Types: Cigarettes  . Quit date: 09/17/2013  . Years since quitting: 6.7  Smokeless Tobacco Never Used     ROS see history of present illness  Objective  Physical Exam Vitals:   06/28/20 1010  BP: 120/80  Pulse: 78  Temp: 98.2 F (36.8 C)  SpO2: 98%    BP Readings from  Last 3 Encounters:  06/28/20 120/80  09/21/19 (!) 138/94  11/11/18 120/84   Wt Readings from Last 3 Encounters:  06/28/20 255 lb 3.2 oz (115.8 kg)  09/21/19 256 lb 12.8 oz (116.5 kg)  11/11/18 252 lb 6.4 oz (114.5 kg)    Physical Exam Constitutional:      General: He is not in acute distress.    Appearance: He is not diaphoretic.  Cardiovascular:     Rate and Rhythm: Normal rate and regular rhythm.     Heart sounds: Normal heart sounds.  Pulmonary:     Effort: Pulmonary effort is normal.     Breath sounds: Normal breath sounds.  Musculoskeletal:     Right lower leg: No edema.     Left lower leg: No edema.  Skin:    General: Skin is warm and dry.  Neurological:     Mental Status: He is alert.    Diabetic Foot Exam - Simple   Simple Foot Form Diabetic Foot exam was performed with the following findings: Yes 06/28/2020 10:31 AM  Visual Inspection See comments: Yes Sensation Testing See comments: Yes Pulse Check Posterior Tibialis and Dorsalis pulse intact bilaterally: Yes Comments Small abrasion on the dorsum of his left foot, otherwise no deformities, ulcerations, or skin breakdown, decreased monofilament testing bilateral toes, otherwise sensation to light touch and other monofilament testing is intact bilaterally     Assessment/Plan: Please see individual problem list.  No problem-specific Assessment & Plan notes found for  this encounter.   Health Maintenance: Long discussion had with the patient regarding the COVID-19 vaccine. I did discuss with the patient that based on news reports they were not able to determine that the virus was manmade though it looks like they had difficulty pinpointing an exact source. I also discussed that I did not think it was a conspiracy as there would have to be too many people involved to keep it under wraps. Discussed that vaccines have been proven to be safe overall and do reduce the risk of death and hospitalization. Also discussed  that they do reduce the risk of contracting Covid though they do not eliminate the risk. I encouraged the patient to get the vaccine when he is ready to get it.  Orders Placed This Encounter  Procedures  . HgB A1c  . Lipid panel  . Comp Met (CMET)  . Urine Microalbumin w/creat. ratio  . Testosterone  . CBC  . PSA    No orders of the defined types were placed in this encounter.   This visit occurred during the SARS-CoV-2 public health emergency.  Safety protocols were in place, including screening questions prior to the visit, additional usage of staff PPE, and extensive cleaning of exam room while observing appropriate contact time as indicated for disinfecting solutions.   I have spent 45 minutes in the care of this patient regarding history taking, documentation, physical exam, placing orders, discussion of COVID-19 vaccine.   Tommi Rumps, MD [place]

## 2020-06-28 NOTE — Assessment & Plan Note (Signed)
Symptoms and exam consistent with neuropathy likely related to his diabetes. Discussed the best treatment is getting his diabetes under control to prevent it from worsening. Offered gabapentin though we deferred at this time as his symptoms are not significantly bothersome.

## 2020-06-28 NOTE — Assessment & Plan Note (Signed)
Continue Metformin. Encouraged him to take it daily. Check A1c. Determine neck step in management once labs return. Urine microalbumin as well.

## 2020-06-28 NOTE — Assessment & Plan Note (Signed)
Adequately controlled. Continue to monitor.

## 2020-06-28 NOTE — Assessment & Plan Note (Signed)
Check lipid panel  

## 2020-06-28 NOTE — Patient Instructions (Signed)
Nice to see you. Please consider getting the COVID-19 vaccine. We will get lab work today. I will also refer you to urology. If you would like medicine for your neuropathy please let us know.

## 2020-07-01 ENCOUNTER — Other Ambulatory Visit: Payer: Self-pay

## 2020-07-01 ENCOUNTER — Telehealth: Payer: Self-pay | Admitting: Family Medicine

## 2020-07-01 ENCOUNTER — Encounter: Payer: Self-pay | Admitting: Family Medicine

## 2020-07-01 DIAGNOSIS — E119 Type 2 diabetes mellitus without complications: Secondary | ICD-10-CM

## 2020-07-01 MED ORDER — METFORMIN HCL ER 500 MG PO TB24: ORAL_TABLET | ORAL | 1 refills | Status: DC

## 2020-07-01 NOTE — Telephone Encounter (Signed)
Pt saw Dr. Caryl Bis on 06/28/20 and needs a refill on metFORMIN (GLUCOPHAGE-XR) 500 MG 24 hr tablet sent to [facility]  Pt is out

## 2020-07-01 NOTE — Addendum Note (Signed)
Addended byElpidio Galea T on: 07/01/2020 09:52 AM   Modules accepted: Orders

## 2020-07-03 ENCOUNTER — Other Ambulatory Visit: Payer: Self-pay | Admitting: Family Medicine

## 2020-07-03 DIAGNOSIS — E291 Testicular hypofunction: Secondary | ICD-10-CM

## 2020-07-05 DIAGNOSIS — S139XXA Sprain of joints and ligaments of unspecified parts of neck, initial encounter: Secondary | ICD-10-CM | POA: Insufficient documentation

## 2020-07-05 DIAGNOSIS — S4380XA Sprain of other specified parts of unspecified shoulder girdle, initial encounter: Secondary | ICD-10-CM

## 2020-07-05 DIAGNOSIS — S335XXA Sprain of ligaments of lumbar spine, initial encounter: Secondary | ICD-10-CM | POA: Insufficient documentation

## 2020-07-05 HISTORY — DX: Sprain of other specified parts of unspecified shoulder girdle, initial encounter: S43.80XA

## 2020-07-05 HISTORY — DX: Sprain of ligaments of lumbar spine, initial encounter: S33.5XXA

## 2020-07-05 HISTORY — DX: Sprain of joints and ligaments of unspecified parts of neck, initial encounter: S13.9XXA

## 2020-07-08 ENCOUNTER — Encounter: Payer: Self-pay | Admitting: Urology

## 2020-07-08 ENCOUNTER — Ambulatory Visit (INDEPENDENT_AMBULATORY_CARE_PROVIDER_SITE_OTHER): Payer: Self-pay | Admitting: Urology

## 2020-07-08 ENCOUNTER — Other Ambulatory Visit: Payer: Self-pay

## 2020-07-08 VITALS — BP 131/85 | HR 67 | Ht 74.0 in | Wt 249.0 lb

## 2020-07-08 DIAGNOSIS — N200 Calculus of kidney: Secondary | ICD-10-CM

## 2020-07-08 DIAGNOSIS — R7989 Other specified abnormal findings of blood chemistry: Secondary | ICD-10-CM

## 2020-07-08 MED ORDER — ANDROGEL PUMP 20.25 MG/ACT (1.62%) TD GEL: 4.0000 | Freq: Every day | TRANSDERMAL | 5 refills | Status: DC

## 2020-07-08 NOTE — Progress Notes (Signed)
   07/08/2020 11:38 AM   William Frey Dec 15, 1970 [id number]  Reason for visit: Follow up nephrolithiasis, low testosterone  HPI: I saw [name] back in urology clinic today for evaluation of low testosterone.  He is a 49 year old male who underwent right ureteroscopy with me in December 2019 for a 1.8 cm right renal pelvis stone with intermittent obstruction.  He denies any stone episodes, flank pain, or gross hematuria since that time.  He did not follow-up as scheduled for postop renal ultrasound or KUB.  He is back today to discuss low testosterone.  He previously was followed by Dr. Jacqlyn Larsen and was on long-term testosterone replacement.  He has not been using the AndroGel over the last 6-9 months, and a testosterone was checked by his PCP recently and was low at 105.  PSA was within the normal range at 1.95.  He is interested in starting back on testosterone.  He also has had worsening of his diabetes with recent hemoglobin A1c of 9.1, and is working on his diet and exercise.  He reports mild difficulty with erections, but is not interested in trying medications at this time but may be in the future.  He had some AndroGel at home and resume this over the last week, and he does note that he feels much better on the AndroGel with more energy.  He is currently using 2 pumps per shoulder in the mornings.  He is interested in other delivery options, but refuses injections or pellets.  We briefly reviewed nasal testosterone as an option.  He would like to continue with the gel currently, but may be interested in nasal testosterone in the future.  We also discussed the need to monitor the PSA while on testosterone.  AndroGel refilled, consider nasal testosterone in the future if persistently low levels RTC 3 months with AM testosterone and PSA prior  Billey Co, MD  [facility] [address], [place], [place] [id number] [phone]

## 2020-07-08 NOTE — Patient Instructions (Signed)
Testosterone nasal gel What is this medicine? TESTOSTERONE (tes TOS ter one) is the main male hormone. It supports normal male traits such as muscle growth, facial hair, and deep voice. This medicine is used in males to treat low testosterone levels. This medicine may be used for other purposes; ask your health care provider or pharmacist if you have questions. COMMON BRAND NAME(S): Natesto What should I tell my health care provider before I take this medicine? They need to know if you have any of these conditions:  breast cancer  breathing problems while you sleep (sleep apnea)  diabetes  current nose or sinus problems like runny nose, sinus surgery, or broken nose  heart disease  kidney disease  liver disease  lung disease  prostate cancer, enlargement  Sjogren's syndrome  an unusual or allergic reaction to testosterone, other medicines, foods, dyes, or preservatives  if a male partner is pregnant or trying to get pregnant How should I use this medicine? This medicine is for use in the nose. Follow the directions on the prescription label. Blow your nose gently before applying this medicine. Wash your hands after use. Do not use on any other body part. Do not take your medicine more often than directed. A special MedGuide will be given to you by the pharmacist with each prescription and refill. Be sure to read this information carefully each time. Talk to your pediatrician regarding the use of this medicine in children. Special care may be needed. Overdosage: If you think you have taken too much of this medicine contact a poison control center or emergency room at once. NOTE: This medicine is only for you. Do not share this medicine with others. What if I miss a dose? If you miss a dose, use it as soon as you can. If it is almost time for your next dose, use only that dose. Do not use double or extra doses. What may interact with this medicine?  certain medicines for  diabetes  certain medicines that treat or prevent blood clots like warfarin  other nasal sprays  steroid medicines like prednisone or cortisone This list may not describe all possible interactions. Give your health care provider a list of all the medicines, herbs, non-prescription drugs, or dietary supplements you use. Also tell them if you smoke, drink alcohol, or use illegal drugs. Some items may interact with your medicine. What should I watch for while using this medicine? Visit your doctor or health care professional for regular checks on your progress. They will need to check the level of testosterone in your blood. This medicine is only approved for use in men who have low levels of testosterone related to certain medical conditions. Heart attacks and strokes have been reported with the use of this medicine. Notify your doctor or health care professional and seek emergency treatment if you develop breathing problems; changes in vision; confusion; chest pain or chest tightness; sudden arm pain; severe, sudden headache; trouble speaking or understanding; sudden numbness or weakness of the face, arm or leg; loss of balance or coordination. Talk to your doctor about the risks and benefits of this medicine. This medicine may affect blood sugar levels. If you have diabetes, check with your doctor or health care professional before you change your diet or the dose of your diabetic medicine. Ask your doctor or pharmacist before applying other medicines in the nose. This drug is banned from use in athletes by most athletic organizations. What side effects may I notice from receiving  this medicine? Side effects that you should report to your doctor or health care professional as soon as possible:  allergic reactions like skin rash, itching or hives, swelling of the face, lips, or tongue  breast enlargement  breathing problems  changes in emotions or moods, especially anger, depression, or  rage  dark urine  general ill feeling or flu-like symptoms  light-colored stools  loss of appetite, nausea  nausea, vomiting  pain, swelling, warmth in the leg  right upper belly pain  stomach pain  swelling of the ankles, feet, hands  too frequent or persistent erections  trouble passing urine or change in the amount of urine  unusually weak or tired  yellowing of the eyes or skin Side effects that usually do not require medical attention (report to your doctor or health care professional if they continue or are bothersome):  acne  change in sex drive or performance  cough  diarrhea  hair loss  headache  nose bleed  nose pain  nose scabs  sore throat  runny nose This list may not describe all possible side effects. Call your doctor for medical advice about side effects. You may report side effects to FDA at 1-800-FDA-1088. Where should I keep my medicine? Keep out of the reach of children. This medicine can be abused. Keep your medicine in a safe place to protect it from theft. Do not share this medicine with anyone. Selling or giving away this medicine is dangerous and against the law. Store at room temperature between 20 to 25 degrees C (68 to 77 degrees F). Throw away any unused medicine after the expiration date. Replace your dispenser when the top of the piston inside the dispenser reaches the arrow at the top of the inside label. Safely throw away your empty dispenser in your household trash away from pets and children. NOTE: This sheet is a summary. It may not cover all possible information. If you have questions about this medicine, talk to your doctor, pharmacist, or health care provider.  2020 Elsevier/Gold Standard (2015-11-28 08:39:15) Testosterone Replacement Therapy  Testosterone replacement therapy (TRT) is used to treat men who have a low testosterone level (hypogonadism). Testosterone is a male hormone that is produced in the testicles. It  is responsible for typically male characteristics and for maintaining a man's sex drive and the ability to get an erection. Testosterone also supports bone and muscle health. TRT can be a gel, liquid, or patch that you put on your skin. It can also be in the form of a tablet or an injection. In some cases, your health care provider may insert long-acting pellets under your skin. In most men, the level of testosterone starts to decline gradually after age 85. Low testosterone can also be caused by certain medical conditions, medicines, and obesity. Your health care provider can diagnose hypogonadism with at least two blood tests that are done early in the morning. Low testosterone may not need to be treated. TRT is usually a choice that you make with your health care provider. Your health care provider may recommend TRT if you have low testosterone that is causing symptoms, such as:  Low sex drive.  Erection problems.  Breast enlargement.  Loss of body hair.  Weak muscles or bones.  Shrinking testicles.  Increased body fat.  Low energy.  Hot flashes.  Depression.  Decreased work Systems analyst. TRT is a lifetime treatment. If you stop treatment, your testosterone will drop, and your symptoms may return. What are the  risks? Testosterone replacement therapy may have side effects, including:  Lower sperm count.  Skin irritation at the application or injection site.  Mouth irritation if you take an oral tablet.  Acne.  Swelling of your legs or feet.  Tender breasts.  Dizziness.  Sleep disturbance.  Mood swings.  Possible increased risk of stroke or heart attack. Testosterone replacement therapy may also increase your risk for prostate cancer or male breast cancer. You should not use TRT if you have either of those conditions. Your health care provider also may not recommend TRT if:  You are suspected of having prostate cancer.  You want to father a child.  You have a  high number of red blood cells.  You have untreated sleep apnea.  You have a very large prostate. Supplies needed:  Your health care provider will prescribe the testosterone gel, solution, or medicine that you need. If your health care provider teaches you to do self-injections at home, you will also need: ? Your medicine vial. ? Disposable needles and syringes. ? Alcohol swabs. ? A needle disposal container. ? Adhesive bandages. How to use testosterone replacement therapy Your health care provider will help you find the TRT option that will work best for you based on your preference, the side effects, and the cost. You may:  Rub testosterone gel on your upper arm or shoulder every day after a shower. This is the most common type of TRT. Do not let women or children come in contact with the gel.  Apply a testosterone solution under your arms once each day.  Place a testosterone patch on your skin once each day.  Dissolve a testosterone tablet in your mouth twice each day.  Have a testosterone pellet inserted under your skin by your health care provider. This will be replaced every 3-6 months.  Use testosterone nasal spray three times each day.  Get testosterone injections. For some types of testosterone, your health care provider will give you this injection. With other types of testosterone, you may be taught to give injections to yourself. The frequency of injections may vary based on the type of testosterone that you receive. Follow these instructions at home:  Take over-the-counter and prescription medicines only as told by your health care provider.  Lose weight if you are overweight. Ask your health care provider to help you start a healthy diet and exercise program to reach and maintain a healthy weight.  Work with your health care provider to treat other medical conditions that may lower your testosterone. These include obesity, high blood pressure, high cholesterol,  diabetes, liver disease, kidney disease, and sleep apnea.  Keep all follow-up visits as told by your health care provider. This is important. General recommendations  Discuss all risks and benefits with your health care provider before starting therapy.  Work with your health care provider to check your prostate health and do blood testing before you start therapy.  Do not use any testosterone replacement therapies that are not prescribed by your health care provider or not approved for use in the [place]  Do not use TRT for bodybuilding or to improve sexual performance. TRT should be used only to treat symptoms of low testosterone.  Return for all repeat prostate checks and blood tests during therapy, as told by your health care provider. Where to find more information Learn more about testosterone replacement therapy from:  [place]: www.urologyhealth.org/urologic-conditions/low-testosterone-(hypogonadism)  Endocrine Society: www.hormone.org/diseases-and-conditions/mens-health/hypogonadism Contact a health care provider if:  You have  side effects from your testosterone replacement therapy.  You continue to have symptoms of low testosterone during treatment.  You develop new symptoms during treatment. Summary  Testosterone replacement therapy is only for men who have low testosterone as determined by blood testing and who have symptoms of low testosterone.  Testosterone replacement therapy should be prescribed only by a health care provider and should be used under the supervision of a health care provider.  You may not be able to take testosterone if you have certain medical conditions, including prostate cancer, male breast cancer, or heart disease.  Testosterone replacement therapy may have side effects and may make some medical conditions worse.  Talk with your health care provider about all the risks and benefits before you start therapy. This  information is not intended to replace advice given to you by your health care provider. Make sure you discuss any questions you have with your health care provider. Document Revised: 11/08/2016 Document Reviewed: 07/16/2016 Elsevier Patient Education  2020 [employer].

## 2020-07-12 ENCOUNTER — Telehealth: Payer: Self-pay

## 2020-08-09 NOTE — Telephone Encounter (Signed)
Patient given the name of the medication Jardiance.  William Frey,cma

## 2020-09-10 ENCOUNTER — Other Ambulatory Visit: Payer: Self-pay | Admitting: *Deleted

## 2020-09-10 DIAGNOSIS — R7989 Other specified abnormal findings of blood chemistry: Secondary | ICD-10-CM

## 2020-09-10 MED ORDER — ANDROGEL PUMP 20.25 MG/ACT (1.62%) TD GEL: 4.0000 | Freq: Every day | TRANSDERMAL | 5 refills | Status: DC

## 2020-09-10 NOTE — Telephone Encounter (Signed)
OK to send to [facility], and ok to increase to 2g, thanks. He has follow up with me soon for labs to check levels  Nickolas Madrid, MD 09/10/2020

## 2020-09-10 NOTE — Telephone Encounter (Signed)
Medication list updated, this is controlled and will not let me send.

## 2020-09-10 NOTE — Telephone Encounter (Signed)
Patient calling asking that his rx for Androgel pump be switched to [facility] instead of [facility] due to cost. Also pt states that the 1g only lasts him 15days, pt would like an increase, please advise.

## 2020-10-08 ENCOUNTER — Other Ambulatory Visit: Payer: Self-pay

## 2020-10-09 ENCOUNTER — Ambulatory Visit: Payer: Self-pay | Admitting: Urology

## 2020-10-14 ENCOUNTER — Other Ambulatory Visit
Admission: RE | Admit: 2020-10-14 | Discharge: 2020-10-14 | Disposition: A | Discharge status: Home / Self Care | Payer: 59 | Source: Ambulatory Visit | Attending: Urology | Admitting: Urology

## 2020-10-14 ENCOUNTER — Other Ambulatory Visit: Payer: Self-pay

## 2020-10-14 DIAGNOSIS — R7989 Other specified abnormal findings of blood chemistry: Secondary | ICD-10-CM | POA: Diagnosis present

## 2020-10-15 LAB — MISC LABCORP TEST (SEND OUT): Labcorp test code: 480772

## 2020-10-15 LAB — TESTOSTERONE: Testosterone: 463 ng/dL (ref 264–916)

## 2020-10-21 ENCOUNTER — Other Ambulatory Visit: Payer: Self-pay | Admitting: Family Medicine

## 2020-10-21 DIAGNOSIS — E119 Type 2 diabetes mellitus without complications: Secondary | ICD-10-CM

## 2020-10-23 ENCOUNTER — Ambulatory Visit (INDEPENDENT_AMBULATORY_CARE_PROVIDER_SITE_OTHER): Payer: 59 | Admitting: Urology

## 2020-10-23 ENCOUNTER — Other Ambulatory Visit: Payer: Self-pay

## 2020-10-23 ENCOUNTER — Encounter: Payer: Self-pay | Admitting: Urology

## 2020-10-23 VITALS — BP 139/94 | HR 73 | Ht 74.0 in | Wt 249.0 lb

## 2020-10-23 DIAGNOSIS — N200 Calculus of kidney: Secondary | ICD-10-CM | POA: Diagnosis not present

## 2020-10-23 DIAGNOSIS — R7989 Other specified abnormal findings of blood chemistry: Secondary | ICD-10-CM

## 2020-10-23 NOTE — Progress Notes (Signed)
   10/23/2020 4:52 PM   William Frey 1971/03/10 [id number]  Reason for visit: Follow up low testosterone, history of nephrolithiasis  HPI: I saw William Frey back in clinic for follow-up of the above issues.  He has a history of a right ureteroscopy with me in December 2019 for a 1.8 cm right renal pelvis stone with intermittent renal colic.  He also has a history of low testosterone and recently resumed AndroGel.  Testosterone today has increased in the normal range at 463 from 105 previously, PSA remains stable at 1.9.  He has also had some intermittent left-sided pain above his hip.  This is intermittent and does not seem to be associated with physical activity or diet.  He does not have any urinary symptoms.  It only occasionally bothersome.  From the history, does not sound like renal colic, but we discussed considering a KUB in the future if he has worsening pain.  We reviewed the risks and benefits of testosterone therapy.  AndroGel refilled RTC with PA 6 months with testosterone, PSA, H&H, KUB   Billey Co, MD  [facility] [address], [place], [place] [address] [phone]

## 2020-10-24 ENCOUNTER — Other Ambulatory Visit: Payer: Self-pay

## 2020-10-28 ENCOUNTER — Encounter: Payer: Self-pay | Admitting: Family Medicine

## 2020-10-28 ENCOUNTER — Ambulatory Visit: Payer: 59 | Admitting: Family Medicine

## 2020-10-28 ENCOUNTER — Other Ambulatory Visit: Payer: Self-pay

## 2020-10-28 VITALS — BP 120/80 | HR 66 | Temp 98.2°F | Ht 74.0 in | Wt 252.4 lb

## 2020-10-28 DIAGNOSIS — E785 Hyperlipidemia, unspecified: Secondary | ICD-10-CM | POA: Diagnosis not present

## 2020-10-28 DIAGNOSIS — E1165 Type 2 diabetes mellitus with hyperglycemia: Secondary | ICD-10-CM | POA: Diagnosis not present

## 2020-10-28 DIAGNOSIS — G629 Polyneuropathy, unspecified: Secondary | ICD-10-CM

## 2020-10-28 DIAGNOSIS — R109 Unspecified abdominal pain: Secondary | ICD-10-CM | POA: Insufficient documentation

## 2020-10-28 DIAGNOSIS — E559 Vitamin D deficiency, unspecified: Secondary | ICD-10-CM | POA: Diagnosis not present

## 2020-10-28 LAB — LDL CHOLESTEROL, DIRECT: Direct LDL: 108 mg/dL

## 2020-10-28 LAB — HEMOGLOBIN A1C: Hgb A1c MFr Bld: 8.2 % — ABNORMAL HIGH (ref 4.6–6.5)

## 2020-10-28 LAB — VITAMIN D 25 HYDROXY (VIT D DEFICIENCY, FRACTURES): VITD: 19.89 ng/mL — ABNORMAL LOW (ref 30.00–100.00)

## 2020-10-28 NOTE — Assessment & Plan Note (Signed)
The patient will continue on Metformin 1000 mg once daily.  Discussed getting back to monitoring his diet.  Discussed the potential for starting on Jardiance once we get his A1c result back.  Advised that the goal would be to get his A1c down so that he reduces his risk of cardiovascular issues and other diabetic issues in the future.

## 2020-10-28 NOTE — Assessment & Plan Note (Signed)
Discussed that current guidelines indicate that he needs a statin to help reduce his risk of stroke and heart attack given his history of diabetes.  He is somewhat resistant to starting on additional medication.  We will recheck his LDL to see if he is less than 70.

## 2020-10-28 NOTE — Patient Instructions (Signed)
Nice to see you. Please start working on diet and exercise again. We will get lab work today and contact you with the results.

## 2020-10-28 NOTE — Progress Notes (Signed)
Tommi Rumps, MD Phone: [phone]  William Frey is a 49 y.o. male who presents today for f/u.  Diabetes: Patient notes he had changed his diet quite a bit and has gotten his fasting sugars down to 98-1 03.  He did this for about 2 months though backtracked on his diet.  He does eat [employer] each day though he is cut out the soda and the fries.  He is very active with work so does not get very much exercise as he is quite busy.  He does note some polydipsia over the last month.  He is taking his Metformin more consistently.  He does note some neuropathy symptoms particularly in his left toes with some pains that feel like his toes are falling asleep.  Somewhat in his right foot.  History of vitamin D deficiency: Patient notes in the past its been low.  He is not currently on a supplement.  Left-sided pain: Patient notes he had several weeks of discomfort in his left side.  He discussed it with his urologist as the patient felt his kidney stones may have come back though the urologist felt maybe it could have been diverticulitis though noted his symptoms would have been more persistent.  Patient has not had symptoms in the last 3 to 4 weeks.  Social History   Tobacco Use  Smoking Status Former Smoker  . Packs/day: 2.00  . Years: 25.00  . Pack years: 50.00  . Types: Cigarettes  . Quit date: 09/17/2013  . Years since quitting: 7.1  Smokeless Tobacco Never Used     ROS see history of present illness  Objective  Physical Exam Vitals:   10/28/20 0841  BP: 120/80  Pulse: 66  Temp: 98.2 F (36.8 C)  SpO2: 98%    BP Readings from Last 3 Encounters:  10/28/20 120/80  10/23/20 (!) 139/94  07/08/20 131/85   Wt Readings from Last 3 Encounters:  10/28/20 252 lb 6.4 oz (114.5 kg)  10/23/20 249 lb (112.9 kg)  07/08/20 249 lb (112.9 kg)    Physical Exam Constitutional:      General: He is not in acute distress.    Appearance: He is not diaphoretic.  Cardiovascular:      Rate and Rhythm: Normal rate and regular rhythm.     Heart sounds: Normal heart sounds.  Pulmonary:     Effort: Pulmonary effort is normal.     Breath sounds: Normal breath sounds.  Musculoskeletal:        General: No edema.  Skin:    General: Skin is warm and dry.  Neurological:     Mental Status: He is alert.    Diabetic Foot Exam - Simple   Simple Foot Form Diabetic Foot exam was performed with the following findings: Yes 10/28/2020  9:18 AM  Visual Inspection No deformities, no ulcerations, no other skin breakdown bilaterally: Yes Sensation Testing See comments: Yes Pulse Check Posterior Tibialis and Dorsalis pulse intact bilaterally: Yes Comments Slight decreased monofilament testing in his toes bilaterally, otherwise intact to light touch and monofilament testing elsewhere      Assessment/Plan: Please see individual problem list.  Problem List Items Addressed This Visit    Hyperlipidemia    Discussed that current guidelines indicate that he needs a statin to help reduce his risk of stroke and heart attack given his history of diabetes.  He is somewhat resistant to starting on additional medication.  We will recheck his LDL to see if he is less  than 70.      Relevant Orders   Direct LDL   Left flank pain    Symptoms are resolved.  Could have been related to a muscular strain.  Does not seem consistent with diverticulitis or kidney stones.  He will monitor for recurrence and if it does recur he will let us know so he can be evaluated in the office.      Neuropathy    Symptoms are consistent with neuropathy.  Discussed that this is likely related to his diabetes.  Discussed the best treatment is getting his diabetes under better control.  He will monitor at this time.      Type 2 diabetes mellitus with hyperglycemia, without long-term current use of insulin ([place]) - Primary    The patient will continue on Metformin 1000 mg once daily.  Discussed getting back to  monitoring his diet.  Discussed the potential for starting on Jardiance once we get his A1c result back.  Advised that the goal would be to get his A1c down so that he reduces his risk of cardiovascular issues and other diabetic issues in the future.      Relevant Orders   HgB A1c   Vitamin D deficiency    Check vitamin D.      Relevant Orders   Vitamin D (25 hydroxy)       Health Maintenance: The patient and I did discuss the COVID-19 vaccine and the COVID-19 virus.  He is very hesitant to get the vaccine as he has seen a number of friends passed away within a couple of weeks of getting the vaccine.  He also notes he is seeing people who have had the COVID-19 virus that have passed away.  He reports having had COVID-19 previously and wonders how long his immunity would last.  I discussed currently we do not know exactly how long this could be though it may be up to 6 months.  The patient is very hesitant to get the vaccine and notes at this time he is deferring this.  He also defers the flu vaccine.   This visit occurred during the SARS-CoV-2 public health emergency.  Safety protocols were in place, including screening questions prior to the visit, additional usage of staff PPE, and extensive cleaning of exam room while observing appropriate contact time as indicated for disinfecting solutions.   I have spent 41 minutes in the care of this patient regarding history taking, physical exam, documentation, discussion of cardiovascular risk of poorly controlled diabetes, discussion of COVID-19 vaccine and virus.   Tommi Rumps, MD [place]

## 2020-10-28 NOTE — Assessment & Plan Note (Signed)
Symptoms are resolved.  Could have been related to a muscular strain.  Does not seem consistent with diverticulitis or kidney stones.  He will monitor for recurrence and if it does recur he will let us know so he can be evaluated in the office.

## 2020-10-28 NOTE — Assessment & Plan Note (Signed)
Check vitamin D. 

## 2020-10-28 NOTE — Assessment & Plan Note (Signed)
Symptoms are consistent with neuropathy.  Discussed that this is likely related to his diabetes.  Discussed the best treatment is getting his diabetes under better control.  He will monitor at this time.

## 2020-11-04 ENCOUNTER — Other Ambulatory Visit: Payer: Self-pay | Admitting: Family Medicine

## 2020-11-04 MED ORDER — EMPAGLIFLOZIN 10 MG PO TABS: 10.0000 mg | ORAL_TABLET | Freq: Every day | ORAL | 2 refills | Status: DC

## 2020-11-25 ENCOUNTER — Encounter: Payer: Self-pay | Admitting: Family Medicine

## 2020-11-26 ENCOUNTER — Other Ambulatory Visit (INDEPENDENT_AMBULATORY_CARE_PROVIDER_SITE_OTHER): Payer: 59

## 2020-11-26 ENCOUNTER — Encounter: Payer: Self-pay | Admitting: Internal Medicine

## 2020-11-26 ENCOUNTER — Ambulatory Visit
Admission: RE | Admit: 2020-11-26 | Discharge: 2020-11-26 | Disposition: A | Discharge status: Home / Self Care | Payer: 59 | Source: Ambulatory Visit | Attending: Internal Medicine | Admitting: Internal Medicine

## 2020-11-26 ENCOUNTER — Other Ambulatory Visit
Admission: RE | Admit: 2020-11-26 | Discharge: 2020-11-26 | Disposition: A | Discharge status: Home / Self Care | Payer: 59 | Source: Ambulatory Visit | Attending: Internal Medicine | Admitting: Internal Medicine

## 2020-11-26 ENCOUNTER — Telehealth (INDEPENDENT_AMBULATORY_CARE_PROVIDER_SITE_OTHER): Payer: 59 | Admitting: Internal Medicine

## 2020-11-26 ENCOUNTER — Other Ambulatory Visit: Payer: Self-pay

## 2020-11-26 VITALS — BP 149/96 | HR 85 | Ht 74.0 in | Wt 245.0 lb

## 2020-11-26 DIAGNOSIS — R059 Cough, unspecified: Secondary | ICD-10-CM

## 2020-11-26 DIAGNOSIS — R0602 Shortness of breath: Secondary | ICD-10-CM | POA: Insufficient documentation

## 2020-11-26 DIAGNOSIS — J329 Chronic sinusitis, unspecified: Secondary | ICD-10-CM | POA: Diagnosis not present

## 2020-11-26 DIAGNOSIS — J029 Acute pharyngitis, unspecified: Secondary | ICD-10-CM | POA: Diagnosis not present

## 2020-11-26 DIAGNOSIS — J020 Streptococcal pharyngitis: Secondary | ICD-10-CM

## 2020-11-26 DIAGNOSIS — J40 Bronchitis, not specified as acute or chronic: Secondary | ICD-10-CM

## 2020-11-26 DIAGNOSIS — I10 Essential (primary) hypertension: Secondary | ICD-10-CM

## 2020-11-26 LAB — BRAIN NATRIURETIC PEPTIDE: B Natriuretic Peptide: 13.7 pg/mL (ref 0.0–100.0)

## 2020-11-26 LAB — POCT RAPID STREP A (OFFICE): Rapid Strep A Screen: POSITIVE — AB

## 2020-11-26 LAB — POCT INFLUENZA A/B
Influenza A, POC: NEGATIVE
Influenza B, POC: NEGATIVE

## 2020-11-26 LAB — TROPONIN I (HIGH SENSITIVITY): Troponin I (High Sensitivity): 3 ng/L (ref <18)

## 2020-11-26 LAB — FIBRIN DERIVATIVES D-DIMER (ARMC ONLY): Fibrin derivatives D-dimer (ARMC): 355.73 ng/mL (FEU) (ref 0.00–499.00)

## 2020-11-26 MED ORDER — ALBUTEROL SULFATE HFA 108 (90 BASE) MCG/ACT IN AERS: 1.0000 | INHALATION_SPRAY | Freq: Four times a day (QID) | RESPIRATORY_TRACT | 0 refills | Status: DC | PRN

## 2020-11-26 MED ORDER — AZITHROMYCIN 250 MG PO TABS: ORAL_TABLET | ORAL | 0 refills | Status: DC

## 2020-11-26 NOTE — Progress Notes (Signed)
Virtual Visit via Video Note  I connected with William Frey  on 11/26/20 at  1:30 PM EST by a video enabled telemedicine application and verified that I am speaking with the correct person using two identifiers.  Location patient: home, [place] Location provider:work or home office Persons participating in the virtual visit: patient, provider pts wife   I discussed the limitations of evaluation and management by telemedicine and the availability of in person appointments. The patient expressed understanding and agreed to proceed.   HPI: 11/24/20 10 pm had sob, dry cough and right sided chest pain today 02 was 90% with talking then rest to walking went to 95-97-98% he is tow truck driver. Home covid test negative.  He has had covid 11/2019 and at that time body aches/cold chills He has not had the covid 19 vaccine and declines. Denies fever currently but reports arms, shoulders achy and increased fatigue and slept 9-10 hours and legs sore from constantly walking as when he sits or lays down or back he feels like he is drowning.  He is not currently smoking h/o asthma and former smoker quit 9 years ago  He feels congested in chest and sinuses and like he has to clear his throat constantly. Wife is not sick. Taking and hot shower or lying face down breathing is better tried nyquil/dayquil last night helped his chest and nasal congestion some  Right sided chest pain is not reproducible with touch denies sweating, dizziness, nausea, 11/25/20 wife's exam no wheezing  HTN with variable BP disc norvasc 2.5 mg qd today pt wants to address issues today and defer to PCP  ROS: See pertinent positives and negatives per HPI.  Past Medical History:  Diagnosis Date  . Allergy   . Anxiety   . Arthritis   . Asthma   . Chicken pox   . Complication of anesthesia    difficult to wake up  . Diabetes mellitus without complication ([place])   . Family history of adverse reaction to anesthesia    father had low  tolerance for anesthesia  . GERD (gastroesophageal reflux disease)   . History of kidney stones   . Hyperlipidemia   . Hypertension   . Low testosterone   . Migraines     Past Surgical History:  Procedure Laterality Date  . CYSTOSCOPY W/ RETROGRADES Right 10/28/2018   Procedure: CYSTOSCOPY WITH RETROGRADE PYELOGRAM;  Surgeon: Billey Co, MD;  Location: [facility] ORS;  Service: Urology;  Laterality: Right;  . CYSTOSCOPY/URETEROSCOPY/HOLMIUM LASER/STENT PLACEMENT Right 10/28/2018   Procedure: CYSTOSCOPY/URETEROSCOPY/HOLMIUM LASER/STENT PLACEMENT;  Surgeon: Billey Co, MD;  Location: [facility] ORS;  Service: Urology;  Laterality: Right;  . OSTECTOMY METATARSAL Right 2003     Current Outpatient Medications:  .  albuterol (VENTOLIN HFA) 108 (90 Base) MCG/ACT inhaler, Inhale 1-2 puffs into the lungs every 6 (six) hours as needed for wheezing or shortness of breath., Disp: 18 g, Rfl: 0 .  ANDROGEL PUMP 20.25 MG/ACT (1.62%) GEL, Apply 4 Doses topically daily. Use 4 pumps (2 on each shoulder) daily., Disp: 2 g, Rfl: 5 .  azithromycin (ZITHROMAX) 250 MG tablet, 2 pills day 1 and 1 pill day 2-5 with food, Disp: 6 tablet, Rfl: 0 .  glucose blood test strip, 1 each by Other route as needed for other (true matrix glucose test strips. E11.9). Use as instructed, Disp: , Rfl:  .  Lancets (ONETOUCH ULTRASOFT) lancets, Use as instructed, Disp: 100 each, Rfl: 12 .  metFORMIN (GLUCOPHAGE-XR) 500 MG 24  hr tablet, TAKE TWO TABLETS ONCE A DAY WITH BREAKFAST, Disp: 180 tablet, Rfl: 1 .  empagliflozin (JARDIANCE) 10 MG TABS tablet, Take 1 tablet (10 mg total) by mouth daily before breakfast. (Patient not taking: Reported on 11/26/2020), Disp: 30 tablet, Rfl: 2  EXAM:  VITALS per patient if applicable:  GENERAL: alert, oriented, appears well and in no acute distress  HEENT: atraumatic, conjunttiva clear, no obvious abnormalities on inspection of external nose and ears  NECK: normal movements of the head  and neck  LUNGS: on inspection no signs of respiratory distress, breathing rate appears normal, no obvious gross SOB, gasping or wheezing  CV: no obvious cyanosis  MS: moves all visible extremities without noticeable abnormality  PSYCH/NEURO: pleasant and cooperative, no obvious depression or anxiety, speech and thought processing grossly intact  ASSESSMENT AND PLAN:  Discussed the following assessment and plan:  Shortness of breath with cough r/o covid/bronchitis/pnuemonia vs angina - Plan: DG Chest 2 View, Troponin I (High Sensitivity), D-Dimer, Quantitative, Novel Coronavirus, NAA ([facility]), albuterol (VENTOLIN HFA) 108 (90 Base) MCG/ACT inhaler If worsening go to [facility] ED  Consider steroids after CXR and labs If +covid will be candidate for MAB home covid test negative he has not had vaccines and he has DM2, obesity  Sore throat - Plan: POCT Influenza A/B, POCT rapid strep A, azithromycin (ZITHROMAX) 250 MG tablet Sinusitis, unspecified chronicity, unspecified location - Plan: azithromycin (ZITHROMAX) 250 MG tablet There is no medication other than over the counter meds:  Mucinex dm green label for cough.  Vitamin C 1000 mg daily.  Vitamin D3 4000 Iu (units) daily.  Zinc 100 mg daily.  Quercetin 250-500 mg 2 times per day   Elderberry  Oil of oregano  cepacol or chloroseptic spray  Warm tea with honey and lemon  Hydration  Try to eat though you dont feel like it   Tylenol or Advil  Nasal saline  Flonase     Monitor pulse oximeter, buy from [place] if oxygen is less than 90 please go to the hospital.        Are you feeling really sick? Shortness of breath, cough, chest pain?, dizziness? Confusion   If so let me know  If worsening, go to hospital or [facility] clinic Urgent care for further treatment   HTN likely variable BP -consider norvasc 2.5 mg qd and disc with PCP pt declines for now  Monitor BP   -we discussed possible serious and likely etiologies, options for  evaluation and workup, limitations of telemedicine visit vs in person visit, treatment, treatment risks and precautions.   I discussed the assessment and treatment plan with the patient. The patient was provided an opportunity to ask questions and all were answered. The patient agreed with the plan and demonstrated an understanding of the instructions.    Time spent 20 min Delorise Jackson, MD

## 2020-11-26 NOTE — Progress Notes (Signed)
Patient took an at home COVID test and this was negative test.  Symptoms started 2 days ago. Cough, congestion, shortness of breath. Laying down Patient feels like he can not breathe.  Patient's oxygen level is 90 and states he feels like he can not clear his throat Patient states feels like something is sitting on the right side of his chest.

## 2020-11-26 NOTE — Patient Instructions (Signed)
If you have covid these are some over the counter medications you can try  There is no medication other than over the counter meds:  Mucinex dm green label for cough.  Vitamin C 1000 mg daily.  Vitamin D3 4000 Iu (units) daily.  Zinc 100 mg daily.  Quercetin 250-500 mg 2 times per day   Elderberry  Oil of oregano  cepacol or chloroseptic spray  Warm tea with honey and lemon  Hydration  Try to eat though you dont feel like it   Tylenol or Advil  Nasal saline  Flonase     Monitor pulse oximeter, buy from [place] if oxygen is less than 90 please go to the hospital.        Are you feeling really sick? Shortness of breath, cough, chest pain?, dizziness? Confusion   If so let me know  If worsening, go to hospital or [facility] clinic Urgent care for further treatment   Strep Throat, Adult Strep throat is an infection in the throat that is caused by bacteria. It is common during the cold months of the year. It mostly affects children who are 34-62 years old. However, people of all ages can get it at any time of the year. This infection spreads from person to person (is contagious) through coughing, sneezing, or having close contact. Your health care provider may use other names to describe the infection. It can be called tonsillitis (if there is swelling of the tonsils), or pharyngitis (if there is swelling at the back of the throat). What are the causes? This condition is caused by the Streptococcus pyogenes bacteria. What increases the risk? You are more likely to develop this condition if:  You care for school-age children, or are around school-age children. Children are more likely to get strep throat and may spread it to others.  You spend time in crowded places where the infection can spread easily.  You have close contact with someone who has strep throat. What are the signs or symptoms? Symptoms of this condition include:  Fever or chills.  Redness, swelling, or pain in the  tonsils or throat.  Pain or difficulty when swallowing.  White or yellow spots on the tonsils or throat.  Tender glands in the neck and under the jaw.  Bad smelling breath.  Red rash all over the body. This is rare. How is this diagnosed? This condition is diagnosed by tests that check for the presence and the amount of bacteria that cause strep throat. They are:  Rapid strep test. Your throat is swabbed and checked for the presence of bacteria. Results are usually ready in minutes.  Throat culture test. Your throat is swabbed. The sample is placed in a cup that allows infections to grow. Results are usually ready in 1 or 2 days.   How is this treated? This condition may be treated with:  Medicines that kill germs (antibiotics).  Medicines that relieve pain or fever. These include: ? Ibuprofen or acetaminophen. ? Aspirin, only for patients who are over the age of 3. ? Throat lozenges. ? Throat sprays. Follow these instructions at home: Medicines  Take over-the-counter and prescription medicines only as told by your health care provider.  Take your antibiotic medicine as told by your health care provider. Do not stop taking the antibiotic even if you start to feel better.   Eating and drinking  If you have trouble swallowing, try eating soft foods until your sore throat feels better.  Drink enough fluid  to keep your urine pale yellow.  To help relieve pain, you may have: ? Warm fluids, such as soup and tea. ? Cold fluids, such as frozen desserts or popsicles.   General instructions  Gargle with a salt-water mixture 3-4 times a day or as needed. To make a salt-water mixture, completely dissolve -1 tsp (3-6 g) of salt in 1 cup (237 mL) of warm water.  Get plenty of rest.  Stay home from work or school until you have been taking antibiotics for 24 hours.  Avoid smoking or being around people who smoke.  Keep all follow-up visits as told by your health care provider.  This is important. How is this prevented?  Do not share food, drinking cups, or personal items that could cause the infection to spread to other people.  Wash your hands well with soap and water, and make sure that all people in your house wash their hands well.  Have family members tested if they have a sore throat or fever. They may need an antibiotic if they have strep throat.   Contact a health care provider if:  The glands in your neck continue to get bigger.  You develop a rash, cough, or earache.  You cough up a thick mucus that is green, yellow-brown, or bloody.  You have pain or discomfort that does not get better with medicine.  Your symptoms seem to be getting worse and not better.  You have a fever. Get help right away if:  You have new symptoms, such as vomiting, severe headache, stiff or painful neck, chest pain, or shortness of breath.  You have severe throat pain, drooling, or changes in your voice.  You have swelling of the neck, or the skin on the neck becomes red and tender.  You have signs of dehydration, such as tiredness (fatigue), dry mouth, and decreased urination.  You become increasingly sleepy, or you cannot wake up completely.  Your joints become red or painful. Summary  Strep throat is an infection in the throat that is caused by the Streptococcus pyogenes bacteria. This infection is spread from person to person (is contagious) through coughing, sneezing, or having close contact.  Take your medicines, including antibiotics, as told by your health care provider. Do not stop taking the antibiotic even if you start to feel better.  To prevent the spread of germs, wash your hands well with soap and water. Have others do the same. Do not share food, drinking cups, or personal items.  Get help right away if you have new symptoms, such as vomiting, severe headache, stiff or painful neck, chest pain, or shortness of breath. This information is not  intended to replace advice given to you by your health care provider. Make sure you discuss any questions you have with your health care provider. Document Revised: 01/13/2019 Document Reviewed: 01/13/2019 Elsevier Patient Education  [employer].

## 2020-11-27 ENCOUNTER — Telehealth: Payer: Self-pay | Admitting: Family Medicine

## 2020-11-27 ENCOUNTER — Telehealth: Payer: Self-pay | Admitting: Internal Medicine

## 2020-11-27 ENCOUNTER — Telehealth (INDEPENDENT_AMBULATORY_CARE_PROVIDER_SITE_OTHER): Payer: 59 | Admitting: Internal Medicine

## 2020-11-27 DIAGNOSIS — Z20822 Contact with and (suspected) exposure to covid-19: Secondary | ICD-10-CM

## 2020-11-27 DIAGNOSIS — U071 COVID-19: Secondary | ICD-10-CM

## 2020-11-27 NOTE — Telephone Encounter (Signed)
Referred for monoclonal antibodies inform patient  If cant wait or worsening rec go to [facility]

## 2020-11-27 NOTE — Telephone Encounter (Signed)
Patient has been informed.

## 2020-11-27 NOTE — Telephone Encounter (Signed)
Pt called and would like a referral for an infusion because he tested positive for covid on at home covid test. He wants meds and a call back ASAP.

## 2020-11-27 NOTE — Telephone Encounter (Signed)
Referred see prior note

## 2020-11-27 NOTE — Telephone Encounter (Signed)
Patient was returning call to let Dr.Mclean that he has test positive for covid with home test after he was  Exposed after having the test yesterday 11-26-20

## 2020-11-27 NOTE — Telephone Encounter (Signed)
Friend tested + for home covid test as of 11/27/20 Still feeling a little bad he was at an auction 11/23/20 and saw this friend and hugged his neck  Pending covid test will wait on decadron 6 mg x 5-10 days pt will call back but offered this to him     Dr. Olivia Mackie McLean-Scocuzza

## 2020-11-28 ENCOUNTER — Other Ambulatory Visit (HOSPITAL_COMMUNITY): Payer: Self-pay | Admitting: Family

## 2020-11-28 ENCOUNTER — Telehealth: Payer: Self-pay

## 2020-11-28 DIAGNOSIS — U071 COVID-19: Secondary | ICD-10-CM

## 2020-11-28 LAB — SARS-COV-2, NAA 2 DAY TAT

## 2020-11-28 LAB — NOVEL CORONAVIRUS, NAA: SARS-CoV-2, NAA: DETECTED — AB

## 2020-11-28 NOTE — Progress Notes (Signed)
I connected by phone with William Frey on 11/28/2020 at 5:43 PM to discuss the potential use of a new treatment for mild to moderate COVID-19 viral infection in non-hospitalized patients.  This patient is a 50 y.o. male that meets the FDA criteria for Emergency Use Authorization of COVID monoclonal antibody sotrovimab.  Has a (+) direct SARS-CoV-2 viral test result  Has mild or moderate COVID-19   Is NOT hospitalized due to COVID-19  Is within 10 days of symptom onset  Has at least one of the high risk factor(s) for progression to severe COVID-19 and/or hospitalization as defined in EUA.  Specific high risk criteria : Diabetes and Cardiovascular disease or hypertension   Symptoms of sore throat, SOB, cough, began 11/24/2020.   I have spoken and communicated the following to the patient or parent/caregiver regarding COVID monoclonal antibody treatment:  1. FDA has authorized the emergency use for the treatment of mild to moderate COVID-19 in adults and pediatric patients with positive results of direct SARS-CoV-2 viral testing who are 34 years of age and older weighing at least 40 kg, and who are at high risk for progressing to severe COVID-19 and/or hospitalization.  2. The significant known and potential risks and benefits of COVID monoclonal antibody, and the extent to which such potential risks and benefits are unknown.  3. Information on available alternative treatments and the risks and benefits of those alternatives, including clinical trials.  4. Patients treated with COVID monoclonal antibody should continue to self-isolate and use infection control measures (e.g., wear mask, isolate, social distance, avoid sharing personal items, clean and disinfect "high touch" surfaces, and frequent handwashing) according to CDC guidelines.   5. The patient or parent/caregiver has the option to accept or refuse COVID monoclonal antibody treatment.  After reviewing this information with  the patient, the patient has agreed to receive one of the available covid 19 monoclonal antibodies and will be provided an appropriate fact sheet prior to infusion. [name], NP 11/28/2020 5:43 PM

## 2020-11-28 NOTE — Telephone Encounter (Signed)
Called to discuss with patient about COVID-19 symptoms and the use of one of the available treatments for those with mild to moderate Covid symptoms and at a high risk of hospitalization.  Pt appears to qualify for outpatient treatment due to co-morbid conditions and/or a member of an at-risk group in accordance with the FDA Emergency Use Authorization.    Symptom onset: 11/24/20 Vaccinated: No Booster? No Immunocompromised? No Qualifiers: DM  Unable to reach pt - Reached pt.  [place]  Pt. Willing to speak to someone about treatment, but states "I'm very nervous about these treatments. I called another doctor yesterday and they prescribed a steroid nebulizer treatment.I feel a little better today."

## 2020-11-29 ENCOUNTER — Ambulatory Visit (HOSPITAL_COMMUNITY)
Admission: RE | Admit: 2020-11-29 | Discharge: 2020-11-29 | Disposition: A | Discharge status: Home / Self Care | Payer: 59 | Source: Ambulatory Visit | Attending: Pulmonary Disease | Admitting: Pulmonary Disease

## 2020-11-29 DIAGNOSIS — U071 COVID-19: Secondary | ICD-10-CM | POA: Diagnosis not present

## 2020-11-29 MED ORDER — SODIUM CHLORIDE 0.9 % IV SOLN: INTRAVENOUS | Status: DC | PRN

## 2020-11-29 MED ORDER — FAMOTIDINE IN NACL 20-0.9 MG/50ML-% IV SOLN: 20.0000 mg | Freq: Once | INTRAVENOUS | Status: DC | PRN

## 2020-11-29 MED ORDER — EPINEPHRINE 0.3 MG/0.3ML IJ SOAJ: 0.3000 mg | Freq: Once | INTRAMUSCULAR | Status: DC | PRN

## 2020-11-29 MED ORDER — DIPHENHYDRAMINE HCL 50 MG/ML IJ SOLN: 50.0000 mg | Freq: Once | INTRAMUSCULAR | Status: DC | PRN

## 2020-11-29 MED ORDER — SOTROVIMAB 500 MG/8ML IV SOLN
500.0000 mg | Freq: Once | INTRAVENOUS | Status: AC
  Administered 2020-11-29: 500 mg via INTRAVENOUS

## 2020-11-29 MED ORDER — ALBUTEROL SULFATE HFA 108 (90 BASE) MCG/ACT IN AERS: 2.0000 | INHALATION_SPRAY | Freq: Once | RESPIRATORY_TRACT | Status: DC | PRN

## 2020-11-29 MED ORDER — METHYLPREDNISOLONE SODIUM SUCC 125 MG IJ SOLR: 125.0000 mg | Freq: Once | INTRAMUSCULAR | Status: DC | PRN

## 2020-11-29 NOTE — Discharge Instructions (Signed)

## 2020-11-29 NOTE — Progress Notes (Signed)
Diagnosis: COVID-19  Physician: Dr. Patrick Wright  Procedure: Covid Infusion Clinic Med: Sotrovimab infusion - Provided patient with sotrovimab fact sheet for patients, parents, and caregivers prior to infusion.   Complications: No immediate complications noted  Discharge: Discharged home    

## 2020-11-29 NOTE — Progress Notes (Signed)
Patient reviewed Fact Sheet for Patients, Parents, and Caregivers for Emergency Use Authorization (EUA) of sotrovimab for the Treatment of Coronavirus. Patient also reviewed and is agreeable to the estimated cost of treatment. Patient is agreeable to proceed.   

## 2020-12-25 ENCOUNTER — Telehealth: Payer: Self-pay | Admitting: Family Medicine

## 2020-12-25 ENCOUNTER — Other Ambulatory Visit: Payer: Self-pay

## 2020-12-25 NOTE — Telephone Encounter (Signed)
Patient is experiencing very high anxiety. He is asking for an appointment to get medication for this issue. He has had this before but never got medication.

## 2020-12-26 ENCOUNTER — Telehealth: Payer: Self-pay | Admitting: Internal Medicine

## 2020-12-26 ENCOUNTER — Ambulatory Visit: Payer: 59 | Admitting: Internal Medicine

## 2020-12-26 NOTE — Telephone Encounter (Signed)
Patient was double booked in a slot for 11:00 am on 12/27/19 with Dr Olivia Mackie McLean-Scocuzza. Called the Patient and rescheduled his appointment for 2/18 at 10:00 am with Dr Olivia Mackie McLean-Scocuzza.   Patient verbalized understanding

## 2020-12-27 ENCOUNTER — Encounter: Payer: Self-pay | Admitting: Internal Medicine

## 2020-12-27 ENCOUNTER — Other Ambulatory Visit
Admission: RE | Admit: 2020-12-27 | Discharge: 2020-12-27 | Disposition: A | Discharge status: Home / Self Care | Payer: 59 | Source: Ambulatory Visit | Attending: Internal Medicine | Admitting: Internal Medicine

## 2020-12-27 ENCOUNTER — Telehealth: Payer: Self-pay | Admitting: *Deleted

## 2020-12-27 ENCOUNTER — Other Ambulatory Visit: Payer: Self-pay

## 2020-12-27 ENCOUNTER — Ambulatory Visit (INDEPENDENT_AMBULATORY_CARE_PROVIDER_SITE_OTHER): Payer: 59 | Admitting: Internal Medicine

## 2020-12-27 VITALS — BP 114/80 | HR 76 | Temp 98.0°F | Ht 74.0 in | Wt 240.2 lb

## 2020-12-27 DIAGNOSIS — R079 Chest pain, unspecified: Secondary | ICD-10-CM | POA: Diagnosis not present

## 2020-12-27 DIAGNOSIS — F32A Depression, unspecified: Secondary | ICD-10-CM

## 2020-12-27 DIAGNOSIS — R0781 Pleurodynia: Secondary | ICD-10-CM | POA: Diagnosis present

## 2020-12-27 DIAGNOSIS — G4700 Insomnia, unspecified: Secondary | ICD-10-CM | POA: Diagnosis not present

## 2020-12-27 DIAGNOSIS — F410 Panic disorder [episodic paroxysmal anxiety] without agoraphobia: Secondary | ICD-10-CM

## 2020-12-27 DIAGNOSIS — F419 Anxiety disorder, unspecified: Secondary | ICD-10-CM

## 2020-12-27 HISTORY — DX: Panic disorder (episodic paroxysmal anxiety): F41.0

## 2020-12-27 LAB — D-DIMER, QUANTITATIVE: D-Dimer, Quant: 0.32 ug/mL-FEU (ref 0.00–0.50)

## 2020-12-27 LAB — TROPONIN I (HIGH SENSITIVITY): High Sens Troponin I: 3 ng/L (ref 2–17)

## 2020-12-27 MED ORDER — DIAZEPAM 5 MG PO TABS: 5.0000 mg | ORAL_TABLET | Freq: Every day | ORAL | 0 refills | Status: DC | PRN

## 2020-12-27 NOTE — Telephone Encounter (Signed)
D-Dimer can not be ran. Per [facility] their process has changed. There will be no charge, but if recollection is needed, pt will need to go to the hospital to be recollected.

## 2020-12-27 NOTE — Addendum Note (Signed)
Addended by: Orland Mustard on: 12/27/2020 04:43 PM   Modules accepted: Orders

## 2020-12-27 NOTE — Patient Instructions (Addendum)
[facility] in [place] [id number]  [place] [id number]    Generalized Anxiety Disorder, Adult Generalized anxiety disorder (GAD) is a mental health condition. Unlike normal worries, anxiety related to GAD is not triggered by a specific event. These worries do not fade or get better with time. GAD interferes with relationships, work, and school. GAD symptoms can vary from mild to severe. People with severe GAD can have intense waves of anxiety with physical symptoms that are similar to panic attacks. What are the causes? The exact cause of GAD is not known, but the following are believed to have an impact:  Differences in natural brain chemicals.  Genes passed down from parents to children.  Differences in the way threats are perceived.  Development during childhood.  Personality. What increases the risk? The following factors may make you more likely to develop this condition:  Being male.  Having a family history of anxiety disorders.  Being very shy.  Experiencing very stressful life events, such as the death of a loved one.  Having a very stressful family environment. What are the signs or symptoms? People with GAD often worry excessively about many things in their lives, such as their health and family. Symptoms may also include:  Mental and emotional symptoms: ? Worrying excessively about natural disasters. ? Fear of being late. ? Difficulty concentrating. ? Fears that others are judging your performance.  Physical symptoms: ? Fatigue. ? Headaches, muscle tension, muscle twitches, trembling, or feeling shaky. ? Feeling like your heart is pounding or beating very fast. ? Feeling out of breath or like you cannot take a deep breath. ? Having trouble falling asleep or staying asleep, or experiencing restlessness. ? Sweating. ? Nausea, diarrhea, or irritable bowel syndrome (IBS).  Behavioral  symptoms: ? Experiencing erratic moods or irritability. ? Avoidance of new situations. ? Avoidance of people. ? Extreme difficulty making decisions. How is this diagnosed? This condition is diagnosed based on your symptoms and medical history. You will also have a physical exam. Your health care provider may perform tests to rule out other possible causes of your symptoms. To be diagnosed with GAD, a person must have anxiety that:  Is out of his or her control.  Affects several different aspects of his or her life, such as work and relationships.  Causes distress that makes him or her unable to take part in normal activities.  Includes at least three symptoms of GAD, such as restlessness, fatigue, trouble concentrating, irritability, muscle tension, or sleep problems. Before your health care provider can confirm a diagnosis of GAD, these symptoms must be present more days than they are not, and they must last for 6 months or longer. How is this treated? This condition may be treated with:  Medicine. Antidepressant medicine is usually prescribed for long-term daily control. Anti-anxiety medicines may be added in severe cases, especially when panic attacks occur.  Talk therapy (psychotherapy). Certain types of talk therapy can be helpful in treating GAD by providing support, education, and guidance. Options include: ? Cognitive behavioral therapy (CBT). People learn coping skills and self-calming techniques to ease their physical symptoms. They learn to identify unrealistic thoughts and behaviors and to replace them with more appropriate thoughts and behaviors. ? Acceptance and commitment therapy (ACT). This treatment teaches people how to be mindful as a way to cope with unwanted thoughts and feelings. ? Biofeedback. This process trains you to manage your body's response (physiological response) through breathing techniques and  relaxation methods. You will work with a therapist while machines  are used to monitor your physical symptoms.  Stress management techniques. These include yoga, meditation, and exercise. A mental health specialist can help determine which treatment is best for you. Some people see improvement with one type of therapy. However, other people require a combination of therapies.   Follow these instructions at home: Lifestyle  Maintain a consistent routine and schedule.  Anticipate stressful situations. Create a plan, and allow extra time to work with your plan.  Practice stress management or self-calming techniques that you have learned from your therapist or your health care provider. General instructions  Take over-the-counter and prescription medicines only as told by your health care provider.  Understand that you are likely to have setbacks. Accept this and be kind to yourself as you persist to take better care of yourself.  Recognize and accept your accomplishments, even if you judge them as small.  Keep all follow-up visits as told by your health care provider. This is important. Contact a health care provider if:  Your symptoms do not get better.  Your symptoms get worse.  You have signs of depression, such as: ? A persistently sad or irritable mood. ? Loss of enjoyment in activities that used to bring you joy. ? Change in weight or eating. ? Changes in sleeping habits. ? Avoiding friends or family members. ? Loss of energy for normal tasks. ? Feelings of guilt or worthlessness. Get help right away if:  You have serious thoughts about hurting yourself or others. If you ever feel like you may hurt yourself or others, or have thoughts about taking your own life, get help right away. Go to your nearest emergency department or:  Call your local emergency services (911 in the [place]  Call a suicide crisis helpline, such as the [facility] at 7196121162. This is open 24 hours a day in the [place]  Text the Crisis  Text Line at 701-211-5683 (in the [place].). Summary  Generalized anxiety disorder (GAD) is a mental health condition that involves worry that is not triggered by a specific event.  People with GAD often worry excessively about many things in their lives, such as their health and family.  GAD may cause symptoms such as restlessness, trouble concentrating, sleep problems, frequent sweating, nausea, diarrhea, headaches, and trembling or muscle twitching.  A mental health specialist can help determine which treatment is best for you. Some people see improvement with one type of therapy. However, other people require a combination of therapies. This information is not intended to replace advice given to you by your health care provider. Make sure you discuss any questions you have with your health care provider. Document Revised: 08/16/2019 Document Reviewed: 08/16/2019 Elsevier Patient Education  2021 [employer].  Nonspecific Chest Pain, Adult Chest pain can be caused by many different conditions. It can be caused by a condition that is life-threatening and requires treatment right away. It can also be caused by something that is not life-threatening. If you have chest pain, it can be hard to know the difference, so it is important to get help right away to make sure that you do not have a serious condition. Some life-threatening causes of chest pain include:  Heart attack.  A tear in the body's main blood vessel (aortic dissection).  Inflammation around your heart (pericarditis).  A problem in the lungs, such as a blood clot (pulmonary embolism) or a collapsed lung (pneumothorax). Some non  life-threatening causes of chest pain include:  Heartburn.  Anxiety or stress.  Damage to the bones, muscles, and cartilage that make up your chest wall.  Pneumonia or bronchitis.  Shingles infection (varicella-zoster virus). Chest pain can feel like:  Pain or discomfort on the surface of your chest or  deep in your chest.  Crushing, pressure, aching, or squeezing pain.  Burning or tingling.  Dull or sharp pain that is worse when you move, cough, or take a deep breath.  Pain or discomfort that is also felt in your back, neck, jaw, shoulder, or arm, or pain that spreads to any of these areas. Your chest pain may come and go. It may also be constant. Your health care provider will do lab tests and other studies to find the cause of your pain. Treatment will depend on the cause of your chest pain. Follow these instructions at home: Medicines  Take over-the-counter and prescription medicines only as told by your health care provider.  If you were prescribed an antibiotic, take it as told by your health care provider. Do not stop taking the antibiotic even if you start to feel better. Lifestyle  Rest as directed by your health care provider.  Do not use any products that contain nicotine or tobacco, such as cigarettes and e-cigarettes. If you need help quitting, ask your health care provider.  Do not drink alcohol.  Make healthy lifestyle choices as recommended. These may include: ? Getting regular exercise. Ask your health care provider to suggest some activities that are safe for you. ? Eating a heart-healthy diet. This includes plenty of fresh fruits and vegetables, whole grains, low-fat (lean) protein, and low-fat dairy products. A dietitian can help you find healthy eating options. ? Maintaining a healthy weight. ? Managing any other health conditions you have, such as high blood pressure (hypertension) or diabetes. ? Reducing stress, such as with yoga or relaxation techniques.   General instructions  Pay attention to any changes in your symptoms. Tell your health care provider about them or any new symptoms.  Avoid any activities that cause chest pain.  Keep all follow-up visits as told by your health care provider. This is important. This includes visits for any further testing  if your chest pain does not go away. Contact a health care provider if:  Your chest pain does not go away.  You feel depressed.  You have a fever. Get help right away if:  Your chest pain gets worse.  You have a cough that gets worse, or you cough up blood.  You have severe pain in your abdomen.  You faint.  You have sudden, unexplained chest discomfort.  You have sudden, unexplained discomfort in your arms, back, neck, or jaw.  You have shortness of breath at any time.  You suddenly start to sweat, or your skin gets clammy.  You feel nausea or you vomit.  You suddenly feel lightheaded or dizzy.  You have severe weakness, or unexplained weakness or fatigue.  Your heart begins to beat quickly, or it feels like it is skipping beats. These symptoms may represent a serious problem that is an emergency. Do not wait to see if the symptoms will go away. Get medical help right away. Call your local emergency services (911 in the [place] Do not drive yourself to the hospital. Summary  Chest pain can be caused by a condition that is serious and requires urgent treatment. It may also be caused by something that is not life-threatening.  If you have chest pain, it is very important to see your health care provider. Your health care provider may do lab tests and other studies to find the cause of your pain.  Follow your health care provider's instructions on taking medicines, making lifestyle changes, and getting emergency treatment if symptoms become worse.  Keep all follow-up visits as told by your health care provider. This includes visits for any further testing if your chest pain does not go away. This information is not intended to replace advice given to you by your health care provider. Make sure you discuss any questions you have with your health care provider. Document Revised: 04/28/2018 Document Reviewed: 04/28/2018 Elsevier Patient Education  2021 [employer].

## 2020-12-27 NOTE — Addendum Note (Signed)
Addended by: Leeanne Rio on: 12/27/2020 03:45 PM   Modules accepted: Orders

## 2020-12-27 NOTE — Progress Notes (Signed)
Chief Complaint  Patient presents with  . Anxiety   F/u  1. Anxiety GAD 7 score 18 and PHQ 9 score 14. Increased anxiety since covid infusion 11/2020. W/in the 2 weeks neighbor died and 3 friends died covid also >10 years ago brother, dad and mom have died his dad was a Environmental education officer he notes. His male friend of 10 years who is confides in (not his wife) lied to him Saturday or Sunday and he has not been coping well with this mind racing, increased anxiety reduced sleep.  He declines meds had tried zoloft in the past and worsening his depression. Also since this happened had reduced po, left chest pain  Review of Systems  Constitutional: Negative for weight loss.  HENT: Negative for hearing loss.   Respiratory: Positive for shortness of breath.   Cardiovascular: Positive for chest pain and palpitations.  Skin: Negative for rash.  Psychiatric/Behavioral: Positive for depression. Negative for suicidal ideas. The patient is nervous/anxious and has insomnia.    Past Medical History:  Diagnosis Date  . Allergy   . Anxiety   . Arthritis   . Asthma   . Chicken pox   . Complication of anesthesia    difficult to wake up  . COVID-19    11/26/20 sp MAB infusion  . Diabetes mellitus without complication ([place])   . Family history of adverse reaction to anesthesia    father had low tolerance for anesthesia  . GERD (gastroesophageal reflux disease)   . History of kidney stones   . Hyperlipidemia   . Hypertension   . Low testosterone   . Migraines    Past Surgical History:  Procedure Laterality Date  . CYSTOSCOPY W/ RETROGRADES Right 10/28/2018   Procedure: CYSTOSCOPY WITH RETROGRADE PYELOGRAM;  Surgeon: Billey Co, MD;  Location: [facility] OR[facility];  Service: Urology;  Laterality: Right;  . CYSTOSCOPY/URETEROSCOPY/HOLMIUM LASER/STENT PLACEMENT Right 10/28/2018   Procedure: CYSTOSCOPY/URETEROSCOPY/HOLMIUM LASER/STENT PLACEMENT;  Surgeon: Billey Co, MD;  Location: [facility] OR[facility];  Service: Urology;   Laterality: Right;  . OSTECTOMY METATARSAL Right 2003   Family History  Problem Relation Age of Onset  . Arthritis Mother   . Cancer Mother        breast and bone  . Heart Problems Mother   . Arthritis Father   . Lymphoma Father   . Heart disease Father   . Stroke Father   . Hypertension Father   . Clotting disorder Father   . Cancer Brother        lung  . Heart disease Brother        atriall fibrillation  . Diabetes Brother   . Heart disease Brother        atrial fibrillation  . Diabetes Maternal Aunt   . Diabetes Maternal Aunt    Social History   Socioeconomic History  . Marital status: Married    Spouse name: Not on file  . Number of children: Not on file  . Years of education: Not on file  . Highest education level: Not on file  Occupational History  . Not on file  Tobacco Use  . Smoking status: Former Smoker    Packs/day: 2.00    Years: 25.00    Pack years: 50.00    Types: Cigarettes    Quit date: 09/17/2013    Years since quitting: 7.2  . Smokeless tobacco: Never Used  Vaping Use  . Vaping Use: Never used  Substance and Sexual Activity  . Alcohol use: No  Alcohol/week: 0.0 standard drinks    Comment: former  . Drug use: No  . Sexual activity: Yes  Other Topics Concern  . Not on file  Social History Narrative   Married          Social Determinants of Health   Financial Resource Strain: Not on file  Food Insecurity: Not on file  Transportation Needs: Not on file  Physical Activity: Not on file  Stress: Not on file  Social Connections: Not on file  Intimate Partner Violence: Not on file   Current Meds  Medication Sig  . ANDROGEL PUMP 20.25 MG/ACT (1.62%) GEL Apply 4 Doses topically daily. Use 4 pumps (2 on each shoulder) daily.  . diazepam (VALIUM) 5 MG tablet Take 1 tablet (5 mg total) by mouth daily as needed for anxiety.  [place] Kitchen glucose blood test strip 1 each by Other route as needed for other (true matrix glucose test strips. E11.9). Use as  instructed  . Lancets (ONETOUCH ULTRASOFT) lancets Use as instructed  . metFORMIN (GLUCOPHAGE-XR) 500 MG 24 hr tablet TAKE TWO TABLETS ONCE A DAY WITH BREAKFAST   Allergies  Allergen Reactions  . Penicillins Hives    Childhood allergy Has patient had a PCN reaction causing immediate rash, facial/tongue/throat swelling, SOB or lightheadedness with hypotension: No Has patient had a PCN reaction causing severe rash involving mucus membranes or skin necrosis: No Has patient had a PCN reaction that required hospitalization: No Has patient had a PCN reaction occurring within the last 10 years: No If all of the above answers are "NO", then may proceed with Cephalosporin use.    Recent Results (from the past 2160 hour(s))  Testosterone     Status: None   Collection Time: 10/14/20  8:53 AM  Result Value Ref Range   Testosterone 463 264 - 916 ng/dL    Comment: (NOTE) Adult male reference interval is based on a population of healthy nonobese males (BMI <30) between 49 and 72 years old. [place], [place] [phone]. PMID: [id number]. Performed At: [facility] [place], [place] [id number] Rush Farmer MD PF:[id number]   Miscellaneous [facility] test (send-out)     Status: None   Collection Time: 10/14/20  8:53 AM  Result Value Ref Range   [facility] test code [id number]    [facility] test name PSA WITH REFLEX FREE:TOTAL     Comment: Performed at [facility], [address] [place], [place] [address]   [place] result COMMENT     Comment: (NOTE) Test Ordered: [phone] PSA Total (Reflex To Free) Prostate Specific Ag           1.9              ng/mL    BN     Reference Range: 0.0-4.0                               Roche ECLIA methodology. According to the American Urological Association, Serum PSA should decrease and remain at undetectable levels after radical prostatectomy. The AUA defines biochemical recurrence as an initial PSA value 0.2 ng/mL or  greater followed by a subsequent confirmatory PSA value 0.2 ng/mL or greater. Values obtained with different assay methods or kits cannot be used interchangeably. Results cannot be interpreted as absolute evidence of the presence or absence of malignant disease. Reflex Criteria  Comment                   BN   The percent free PSA is performed on a reflex basis only when the total PSA is between 4.0 and 10.0 ng/mL. Performed At: [facility] [address] [place], [place] [id number] Rush Farmer MD NW:[id number]   HgB A1c     Status: Abnormal   Collection Time: 10/28/20  9:16 AM  Result Value Ref Range   Hgb A1c MFr Bld 8.2 (H) 4.6 - 6.5 %    Comment: Glycemic Control Guidelines for People with Diabetes:Non Diabetic:  <6%Goal of Therapy: <7%Additional Action Suggested:  >8%   Direct LDL     Status: None   Collection Time: 10/28/20  9:16 AM  Result Value Ref Range   Direct LDL 108.0 mg/dL    Comment: Optimal:  <100 mg/dLNear or Above Optimal:  100-129 mg/dLBorderline High:  130-159 mg/dLHigh:  160-189 mg/dLVery High:  >190 mg/dL  Vitamin D (25 hydroxy)     Status: Abnormal   Collection Time: 10/28/20  9:16 AM  Result Value Ref Range   VITD 19.89 (L) 30.00 - 100.00 ng/mL  Novel Coronavirus, NAA ([facility])     Status: Abnormal   Collection Time: 11/26/20  2:10 PM   Specimen: Nasal Swab; Nasopharyngeal(NP) swabs in vial transport medium   Nasopharynge  Result Value Ref Range   SARS-CoV-2, NAA Detected (A) Not Detected    Comment: Patients who have a positive COVID-19 test result may now have treatment options. Treatment options are available for patients with mild to moderate symptoms and for hospitalized patients. Visit our website at [email or web address] for resources and information. This nucleic acid amplification test was developed and its performance characteristics determined by [employer], Dickinson and Company. Nucleic acid amplification tests include  RT-PCR and TMA. This test has not been FDA cleared or approved. This test has been authorized by FDA under an Emergency Use Authorization (EUA). This test is only authorized for the duration of time the declaration that circumstances exist justifying the authorization of the emergency use of in vitro diagnostic tests for detection of SARS-CoV-2 virus and/or diagnosis of COVID-19 infection under section 564(b)(1) of the Act, 21 U.S.C. 578ION-6(E) (1), unless the authorization is terminated or revoked sooner. When diagnostic testing is negativ e, the possibility of a false negative result should be considered in the context of a patient's recent exposures and the presence of clinical signs and symptoms consistent with COVID-19. An individual without symptoms of COVID-19 and who is not shedding SARS-CoV-2 virus would expect to have a negative (not detected) result in this assay.   SARS-COV-2, NAA 2 DAY TAT     Status: None   Collection Time: 11/26/20  2:10 PM   Nasopharynge  Result Value Ref Range   SARS-CoV-2, NAA 2 DAY TAT Performed   POCT rapid strep A     Status: Abnormal   Collection Time: 11/26/20  2:46 PM  Result Value Ref Range   Rapid Strep A Screen Positive (A) Negative  POCT Influenza A/B     Status: Normal   Collection Time: 11/26/20  2:47 PM  Result Value Ref Range   Influenza A, POC Negative Negative   Influenza B, POC Negative Negative  B Nat Peptide     Status: None   Collection Time: 11/26/20  2:55 PM  Result Value Ref Range   B Natriuretic Peptide 13.7 0.0 - 100.0 pg/mL    Comment: Performed at [facility], [address]  [place], [place] [address]  Troponin I (High Sensitivity)     Status: None   Collection Time: 11/26/20  2:55 PM  Result Value Ref Range   Troponin I (High Sensitivity) 3 <18 ng/L    Comment: (NOTE) Elevated high sensitivity troponin I (hsTnI) values and significant  changes across serial measurements may suggest ACS but many other   chronic and acute conditions are known to elevate hsTnI results.  Refer to the "Links" section for chest pain algorithms and additional  guidance. Performed at [facility], [address] [place], [place] [address]   Fibrin derivatives D-Dimer [facility] only)     Status: None   Collection Time: 11/26/20  2:55 PM  Result Value Ref Range   Fibrin derivatives D-dimer ([facility]) 355.73 0.00 - 499.00 ng/mL (FEU)    Comment: (NOTE) <> Exclusion of Venous Thromboembolism (VTE) - OUTPATIENT ONLY   (Emergency Department or [place])    0-499 ng/ml (FEU): With a low to intermediate pretest probability                      for VTE this test result excludes the diagnosis                      of VTE.   >499 ng/ml (FEU) : VTE not excluded; additional work up for VTE is                      required.  <> Testing on Inpatients and Evaluation of Disseminated Intravascular   Coagulation (DIC) Reference Range:   0-499 ng/ml (FEU) Performed at [facility], [address] [place], [place] [address]   Troponin I (High Sensitivity)     Status: None   Collection Time: 12/27/20 10:43 AM  Result Value Ref Range   High Sens Troponin I 3 2 - 17 ng/L   Objective  Body mass index is 30.84 kg/m. Wt Readings from Last 3 Encounters:  12/27/20 240 lb 3.2 oz (109 kg)  11/26/20 245 lb (111.1 kg)  10/28/20 252 lb 6.4 oz (114.5 kg)   Temp Readings from Last 3 Encounters:  12/27/20 98 F (36.7 C) (Oral)  11/29/20 98 F (36.7 C) (Oral)  10/28/20 98.2 F (36.8 C) (Oral)   BP Readings from Last 3 Encounters:  12/27/20 114/80  11/29/20 (!) 127/93  11/26/20 (!) 149/96   Pulse Readings from Last 3 Encounters:  12/27/20 76  11/29/20 70  11/26/20 85    Physical Exam Vitals and nursing note reviewed.  Constitutional:      Appearance: Normal appearance. He is well-developed and well-groomed. He is obese.  HENT:     Head: Normocephalic and atraumatic.  Cardiovascular:     Rate and  Rhythm: Normal rate and regular rhythm.     Heart sounds: Normal heart sounds. No murmur heard.   Pulmonary:     Effort: Pulmonary effort is normal.     Breath sounds: Normal breath sounds.  Chest:     Chest wall: No tenderness.  Skin:    General: Skin is warm and dry.  Neurological:     General: No focal deficit present.     Mental Status: He is alert and oriented to person, place, and time. Mental status is at baseline.     Gait: Gait normal.  Psychiatric:        Attention and Perception: Attention and perception normal.  Mood and Affect: Mood and affect normal.        Speech: Speech normal.        Behavior: Behavior normal. Behavior is cooperative.        Thought Content: Thought content normal.        Cognition and Memory: Cognition and memory normal.        Judgment: Judgment normal.     Assessment  Plan  Anxiety and depression/panic/insomnia - Plan: diazepam (VALIUM) 5 MG tablet, Ambulatory referral to Psychiatry, Ambulatory referral to Psychology [facility] in [place]  Pt declines other meds for now tried zoloft in the past made depression worse   Chest pain, EKG NSR today r/o DVT likely psychological due to panic- Plan: EKG 12-Lead, Troponin I (High Sensitivity), Ambulatory referral to Cardiology, D-Dimer, Quantitative  Provider: Dr. Olivia Mackie McLean-Scocuzza-[facility]

## 2020-12-30 ENCOUNTER — Telehealth: Payer: Self-pay | Admitting: Internal Medicine

## 2020-12-30 NOTE — Telephone Encounter (Signed)
Patient was was seen for this issue.  William Frey,cma

## 2020-12-30 NOTE — Telephone Encounter (Signed)
Clarisse Gouge  McLean-Scocuzza, Nino Glow, MD Patient declined cardiology referral for now. He states the valium he is taking is helping and he has something going on right now so he cannot come in.    Patient agreed to call if he needs to be seen.    Closing referral for now.    [facility]

## 2020-12-31 LAB — HM DIABETES EYE EXAM

## 2020-12-31 LAB — D-DIMER, QUANTITATIVE

## 2021-01-14 ENCOUNTER — Telehealth: Payer: Self-pay

## 2021-01-14 NOTE — Telephone Encounter (Signed)
Pt wants to know if he can have a refill on diazepam (VALIUM) 5 MG tablet. He can not get an appt with [facility] until the end of next week.

## 2021-01-14 NOTE — Telephone Encounter (Signed)
Last refilled 2/18 by Dr. Olivia Mackie  Last OV 2/18 w/ Dr. Olivia Mackie  Next OV 4/20

## 2021-01-15 NOTE — Telephone Encounter (Signed)
Please advise if okay to refill. 

## 2021-01-15 NOTE — Telephone Encounter (Signed)
I am going to defer this to Dr. Terese Door as she saw him for this issue.  Please see the initial message.

## 2021-01-15 NOTE — Telephone Encounter (Signed)
Pt needs to f/u with PCP, psychiatry and therapy valium was temporary only further refills PCP/psychiatry for anxiety management   Did he make f/u with all above? Move f/u with PCP sooner than 02/26/21

## 2021-01-15 NOTE — Telephone Encounter (Signed)
LVM for the patient to call back for a sooner appointment with the provider.  Huey Scalia,cma

## 2021-01-21 NOTE — Telephone Encounter (Signed)
Mailbox is full, could not leave a message to call back for a sooner visit other than 02/26/2021.  Nina,cma

## 2021-01-30 ENCOUNTER — Other Ambulatory Visit: Payer: Self-pay | Admitting: Internal Medicine

## 2021-01-30 DIAGNOSIS — F419 Anxiety disorder, unspecified: Secondary | ICD-10-CM

## 2021-01-30 DIAGNOSIS — F32A Depression, unspecified: Secondary | ICD-10-CM

## 2021-01-30 NOTE — Telephone Encounter (Signed)
He needs to follow-up or see psychiatry for a refill on this medication. It looks like we tried to contact him regarding this previously and no one could get in touch with him. Please call him about this.

## 2021-02-06 NOTE — Telephone Encounter (Signed)
I called and scheduled the patient to see the provider about his anxiety.  Patrena Santalucia,cma

## 2021-02-10 ENCOUNTER — Encounter: Payer: Self-pay | Admitting: Family Medicine

## 2021-02-10 ENCOUNTER — Telehealth: Payer: Self-pay

## 2021-02-10 ENCOUNTER — Ambulatory Visit (INDEPENDENT_AMBULATORY_CARE_PROVIDER_SITE_OTHER): Payer: 59 | Admitting: Family Medicine

## 2021-02-10 ENCOUNTER — Other Ambulatory Visit: Payer: Self-pay

## 2021-02-10 VITALS — BP 110/86 | HR 69 | Temp 97.7°F | Ht 74.0 in | Wt 249.0 lb

## 2021-02-10 DIAGNOSIS — F32A Depression, unspecified: Secondary | ICD-10-CM | POA: Diagnosis not present

## 2021-02-10 DIAGNOSIS — Z8616 Personal history of COVID-19: Secondary | ICD-10-CM | POA: Diagnosis not present

## 2021-02-10 DIAGNOSIS — F419 Anxiety disorder, unspecified: Secondary | ICD-10-CM

## 2021-02-10 DIAGNOSIS — E1165 Type 2 diabetes mellitus with hyperglycemia: Secondary | ICD-10-CM

## 2021-02-10 HISTORY — DX: Personal history of COVID-19: Z86.16

## 2021-02-10 LAB — POCT GLYCOSYLATED HEMOGLOBIN (HGB A1C): Hemoglobin A1C: 8.4 % — AB (ref 4.0–5.6)

## 2021-02-10 MED ORDER — DIAZEPAM 5 MG PO TABS: 5.0000 mg | ORAL_TABLET | Freq: Every day | ORAL | 0 refills | Status: DC | PRN

## 2021-02-10 MED ORDER — EMPAGLIFLOZIN 10 MG PO TABS: 10.0000 mg | ORAL_TABLET | Freq: Every day | ORAL | 2 refills | Status: DC

## 2021-02-10 NOTE — Assessment & Plan Note (Signed)
Patient has recovered at this time.

## 2021-02-10 NOTE — Assessment & Plan Note (Signed)
Likely uncontrolled.  We will check an A1c.  He will try to take his Metformin consistently.  We will send in [place] for him to take as well.

## 2021-02-10 NOTE — Assessment & Plan Note (Signed)
Patient has fairly significant anxiety.  Seems to have mild depression.  He does not want to see a therapist or psychiatrist.  He does not want an SSRI or similar medication for this.  I discussed with him that he is going to have to make some decisions on his own as to what he can do to improve his situation.  Discussed we could continue Valium for short period of time for acute symptoms though long-term use of this would not be recommended or prescribed.  Advised to monitor for drowsiness with this medication.

## 2021-02-10 NOTE — Telephone Encounter (Signed)
Called the pharmacy and cancelled the Rx for Valium.  Deloss Amico,cma

## 2021-02-10 NOTE — Telephone Encounter (Signed)
Noted.  [facility]  Please call [facility] and cancel the prescription.

## 2021-02-10 NOTE — Patient Instructions (Signed)
Nice to see you. We will refill your Valium.  If this starts to make you drowsy please let us know and discontinue use. We will check an A1c and let you know what the results are. Please try to take your Metformin consistently.
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
## 2021-02-19 ENCOUNTER — Other Ambulatory Visit: Payer: Self-pay

## 2021-02-26 ENCOUNTER — Ambulatory Visit: Payer: 59 | Admitting: Family Medicine

## 2021-04-16 ENCOUNTER — Other Ambulatory Visit: Payer: Self-pay | Admitting: Urology

## 2021-04-16 ENCOUNTER — Other Ambulatory Visit: Payer: 59

## 2021-04-16 ENCOUNTER — Other Ambulatory Visit: Payer: Self-pay

## 2021-04-16 DIAGNOSIS — R7989 Other specified abnormal findings of blood chemistry: Secondary | ICD-10-CM

## 2021-04-16 MED ORDER — ANDROGEL PUMP 20.25 MG/ACT (1.62%) TD GEL: 4.0000 | Freq: Every day | TRANSDERMAL | 0 refills | Status: DC

## 2021-04-16 NOTE — Telephone Encounter (Signed)
Pt is out of testosterone, he came in for labs today and has appt w/Shannon next week.  He wants to know if he can get RX sent in to [facility] on [address].

## 2021-04-16 NOTE — Telephone Encounter (Signed)
Last script sent in November 2021, ok to fill short term supply? Appt next week 04/13/21. Please advise.

## 2021-04-22 NOTE — Progress Notes (Signed)
04/23/2021 3:27 PM   William Frey 11/04/1971 [id number]  Referring provider: Leone Haven, MD [address] [place],  [place] [address]  Urological history: 1. Nephrolithiasis -URS for right 1.8 cm stone 2019   2. Testosterone deficiency -contributing factors of age, HTN and DM -testosterone level 400 on 04/2021 -H & H WNL  -manage with testosterone gel 1.62%, 4 pumps daily  3. ED -contributing factors of age, DM, testosterone deficiency, HTN, anxiety and depression -SHIM 24    Chief Complaint  Patient presents with   Hypogonadism   HPI: William Frey is a 50 y.o. male who presents today for a 6 months follow up.  He has been noting that his urine has a sweet odor to it and has been having a rash underneath his armpit and he was wondering if testosterone gel is contributing to this.  As his hemoglobin A1c is 8.1, I stated this is most likely due to the uncontrolled diabetes.  PSA 1.7 in 04/2021.  He is experiencing post void dribbling.  Patient denies any modifying or aggravating factors.  Patient denies any gross hematuria, dysuria or suprapubic/flank pain.  Patient denies any fevers, chills, nausea or vomiting.     IPSS     Row Name 04/23/21 1400         International Prostate Symptom Score   How often have you had the sensation of not emptying your bladder? Not at All     How often have you had to urinate less than every two hours? Not at All     How often have you found you stopped and started again several times when you urinated? Not at All     How often have you found it difficult to postpone urination? Not at All     How often have you had a weak urinary stream? Not at All     How often have you had to strain to start urination? Not at All     How many times did you typically get up at night to urinate? None     Total IPSS Score 0           Quality of Life due to urinary symptoms     If you were to spend the rest of your  life with your urinary condition just the way it is now how would you feel about that? Delighted             Score:  1-7 Mild 8-19 Moderate 20-35 Severe    Patient still having spontaneous erections.  He denies any pain or significant curvature with erections.    SHIM     Row Name 04/23/21 1427         SHIM: Over the last 6 months:   How do you rate your confidence that you could get and keep an erection? High     When you had erections with sexual stimulation, how often were your erections hard enough for penetration (entering your partner)? Almost Always or Always     During sexual intercourse, how often were you able to maintain your erection after you had penetrated (entered) your partner? Almost Always or Always     During sexual intercourse, how difficult was it to maintain your erection to completion of intercourse? Not Difficult     When you attempted sexual intercourse, how often was it satisfactory for you? Almost Always or Always  SHIM Total Score     SHIM 24             Score: 1-7 Severe ED 8-11 Moderate ED 12-16 Mild-Moderate ED 17-21 Mild ED 22-25 No ED   PMH: Past Medical History:  Diagnosis Date   Allergy    Anxiety    Arthritis    Asthma    Chicken pox    Complication of anesthesia    difficult to wake up   COVID-19    11/26/20 sp MAB infusion   Diabetes mellitus without complication (HCC)    Family history of adverse reaction to anesthesia    father had low tolerance for anesthesia   GERD (gastroesophageal reflux disease)    History of kidney stones    Hyperlipidemia    Hypertension    Low testosterone    Migraines     Surgical History: Past Surgical History:  Procedure Laterality Date   CYSTOSCOPY W/ RETROGRADES Right 10/28/2018   Procedure: CYSTOSCOPY WITH RETROGRADE PYELOGRAM;  Surgeon: Billey Co, MD;  Location: [facility] ORS;  Service: Urology;  Laterality: Right;   CYSTOSCOPY/URETEROSCOPY/HOLMIUM LASER/STENT  PLACEMENT Right 10/28/2018   Procedure: CYSTOSCOPY/URETEROSCOPY/HOLMIUM LASER/STENT PLACEMENT;  Surgeon: Billey Co, MD;  Location: [facility] ORS;  Service: Urology;  Laterality: Right;   OSTECTOMY METATARSAL Right 2003    Home Medications:  Allergies as of 04/23/2021       Reactions   Penicillins Hives   Childhood allergy Has patient had a PCN reaction causing immediate rash, facial/tongue/throat swelling, SOB or lightheadedness with hypotension: No Has patient had a PCN reaction causing severe rash involving mucus membranes or skin necrosis: No Has patient had a PCN reaction that required hospitalization: No Has patient had a PCN reaction occurring within the last 10 years: No If all of the above answers are "NO", then may proceed with Cephalosporin use.        Medication List        Accurate as of April 23, 2021  3:27 PM. If you have any questions, ask your nurse or doctor.          albuterol 108 (90 Base) MCG/ACT inhaler Commonly known as: VENTOLIN HFA Inhale 1-2 puffs into the lungs every 6 (six) hours as needed for wheezing or shortness of breath.   AndroGel Pump 20.25 MG/ACT (1.62%) Gel Generic drug: Testosterone Apply 4 Doses topically daily. Use 4 pumps (2 on each shoulder) daily.   diazepam 5 MG tablet Commonly known as: VALIUM Take 1 tablet (5 mg total) by mouth daily as needed for anxiety.   empagliflozin 10 MG Tabs tablet Commonly known as: JARDIANCE Take 1 tablet (10 mg total) by mouth daily before breakfast.   glucose blood test strip 1 each by Other route as needed for other (true matrix glucose test strips. E11.9). Use as instructed   metFORMIN 500 MG 24 hr tablet Commonly known as: GLUCOPHAGE-XR TAKE TWO TABLETS ONCE A DAY WITH BREAKFAST   onetouch ultrasoft lancets Use as instructed        Allergies:  Allergies  Allergen Reactions   Penicillins Hives    Childhood allergy Has patient had a PCN reaction causing immediate rash,  facial/tongue/throat swelling, SOB or lightheadedness with hypotension: No Has patient had a PCN reaction causing severe rash involving mucus membranes or skin necrosis: No Has patient had a PCN reaction that required hospitalization: No Has patient had a PCN reaction occurring within the last 10 years: No If all of the above answers are "NO",  then may proceed with Cephalosporin use.     Family History: Family History  Problem Relation Age of Onset   Arthritis Mother    Cancer Mother        breast and bone   Heart Problems Mother    Arthritis Father    Lymphoma Father    Heart disease Father    Stroke Father    Hypertension Father    Clotting disorder Father    Cancer Brother        lung   Heart disease Brother        atriall fibrillation   Diabetes Brother    Heart disease Brother        atrial fibrillation   Diabetes Maternal Aunt    Diabetes Maternal Aunt     Social History:  reports that he quit smoking about 7 years ago. His smoking use included cigarettes. He has a 50.00 pack-year smoking history. He has never used smokeless tobacco. He reports that he does not drink alcohol and does not use drugs.  ROS: Pertinent ROS in HPI  Physical Exam: BP 123/80   Pulse 77   Ht 6\' 2"  (1.88 m)   Wt 245 lb (111.1 kg)   BMI 31.46 kg/m   Constitutional:  Well nourished. Alert and oriented, No acute distress. HEENT: [place] AT, mask in place.  Trachea midline Cardiovascular: No clubbing, cyanosis, or edema. Respiratory: Normal respiratory effort, no increased work of breathing. GU: No CVA tenderness.  No bladder fullness or masses.  Patient with circumcised phallus.  Urethral meatus is patent.  No penile discharge. No penile lesions or rashes. Scrotum without lesions, cysts, rashes and/or edema.  Testicles are located scrotally bilaterally. No masses are appreciated in the testicles. Atrophic bilaterally.  Left and right epididymis are normal. Rectal: Patient with  normal sphincter  tone. Anus and perineum without scarring or rashes. No rectal masses are appreciated. Prostate is approximately 50 grams, could only palpate the apex and midportion of gland, no nodules are appreciated. Seminal vesicles could not be palpated.  Lymph: No cervical or inguinal adenopathy. Neurologic: Grossly intact, no focal deficits, moving all 4 extremities. Psychiatric: Normal mood and affect.  Laboratory Data: Lab Results  Component Value Date   WBC 6.7 06/28/2020   HGB 14.7 04/16/2021   HCT 44.7 04/16/2021   MCV 90.1 06/28/2020   PLT 195.0 06/28/2020    Lab Results  Component Value Date   CREATININE 1.17 06/28/2020    Lab Results  Component Value Date   PSA 1.95 06/28/2020   PSA 0.54 09/18/2015    Lab Results  Component Value Date   TESTOSTERONE 400 04/16/2021    Lab Results  Component Value Date   HGBA1C 8.4 (A) 02/10/2021    Lab Results  Component Value Date   TSH 1.71 09/21/2019       Component Value Date/Time   CHOL 183 06/28/2020 1045   CHOL 119 01/02/2014 0449   HDL 31.80 (L) 06/28/2020 1045   HDL 25 (L) 01/02/2014 0449   CHOLHDL 6 06/28/2020 1045   VLDL 77.6 (H) 06/28/2020 1045   VLDL 47 (H) 01/02/2014 0449   LDLCALC 47 01/02/2014 0449    Lab Results  Component Value Date   AST 13 06/28/2020   Lab Results  Component Value Date   ALT 15 06/28/2020  I have reviewed the labs.   Pertinent Imaging: No recent imaging  Assessment & Plan:    1. Testosterone deficiency -At therapeutic levels -Continue AndroGel  1.62% at 4 pumps daily -refilled AndroGel 1.62%, 4 pumps  2. BPH with LU TS -discussed starting a medication at this time for his post-void dribbling and he defers at this time   Return in about 6 months (around 10/23/2021) for PSA, testosterone, H & H, IPSS, SHIM and exam.  These notes generated with voice recognition software. I apologize for typographical errors.  Zara Council, PA-C  [facility] [address]  [place], [place] [address] [phone]

## 2021-04-23 ENCOUNTER — Ambulatory Visit: Payer: Self-pay | Admitting: Urology

## 2021-04-23 ENCOUNTER — Ambulatory Visit (INDEPENDENT_AMBULATORY_CARE_PROVIDER_SITE_OTHER): Payer: 59 | Admitting: Urology

## 2021-04-23 ENCOUNTER — Encounter: Payer: Self-pay | Admitting: Urology

## 2021-04-23 ENCOUNTER — Other Ambulatory Visit: Payer: Self-pay

## 2021-04-23 VITALS — BP 123/80 | HR 77 | Ht 74.0 in | Wt 245.0 lb

## 2021-04-23 DIAGNOSIS — R7989 Other specified abnormal findings of blood chemistry: Secondary | ICD-10-CM

## 2021-04-23 DIAGNOSIS — E349 Endocrine disorder, unspecified: Secondary | ICD-10-CM | POA: Diagnosis not present

## 2021-04-23 MED ORDER — ANDROGEL PUMP 20.25 MG/ACT (1.62%) TD GEL
4.0000 | Freq: Every day | TRANSDERMAL | 0 refills | Status: DC
Start: 2021-04-23 — End: 2021-04-30

## 2021-04-30 ENCOUNTER — Other Ambulatory Visit: Payer: Self-pay

## 2021-04-30 ENCOUNTER — Telehealth: Payer: Self-pay

## 2021-04-30 ENCOUNTER — Other Ambulatory Visit: Payer: Self-pay | Admitting: Urology

## 2021-04-30 DIAGNOSIS — R7989 Other specified abnormal findings of blood chemistry: Secondary | ICD-10-CM

## 2021-04-30 LAB — PSA: Prostate Specific Ag, Serum: 1.7 ng/mL (ref 0.0–4.0)

## 2021-04-30 LAB — TESTOSTERONE: Testosterone: 400 ng/dL (ref 264–916)

## 2021-04-30 LAB — HEMOGLOBIN AND HEMATOCRIT, BLOOD
Hematocrit: 44.7 % (ref 37.5–51.0)
Hemoglobin: 14.7 g/dL (ref 13.0–17.7)

## 2021-04-30 MED ORDER — ANDROGEL PUMP 20.25 MG/ACT (1.62%) TD GEL: 4.0000 | Freq: Every day | TRANSDERMAL | 0 refills | Status: DC

## 2021-04-30 MED ORDER — ANDROGEL PUMP 20.25 MG/ACT (1.62%) TD GEL
4.0000 | Freq: Every day | TRANSDERMAL | 5 refills | Status: DC
Start: 2021-04-30 — End: 2022-03-03

## 2021-04-30 NOTE — Progress Notes (Signed)
Prescription printed for AndroGel for [facility].

## 2021-04-30 NOTE — Telephone Encounter (Signed)
Dean called to get a verbal order confirmation that Androgel script could be filled with generic and that 2 boxes could be dispensed . Per Zara Council, PAC ok to substitute and fill w/2 boxes, pharmacist gave verbal read back

## 2021-05-21 ENCOUNTER — Ambulatory Visit: Payer: 59 | Admitting: Family Medicine

## 2021-05-26 ENCOUNTER — Other Ambulatory Visit: Payer: Self-pay

## 2021-05-26 ENCOUNTER — Ambulatory Visit (INDEPENDENT_AMBULATORY_CARE_PROVIDER_SITE_OTHER): Payer: 59 | Admitting: Family Medicine

## 2021-05-26 ENCOUNTER — Encounter: Payer: Self-pay | Admitting: Family Medicine

## 2021-05-26 VITALS — BP 120/80 | HR 74 | Temp 98.3°F | Ht 74.0 in | Wt 250.8 lb

## 2021-05-26 DIAGNOSIS — E119 Type 2 diabetes mellitus without complications: Secondary | ICD-10-CM

## 2021-05-26 DIAGNOSIS — F419 Anxiety disorder, unspecified: Secondary | ICD-10-CM | POA: Diagnosis not present

## 2021-05-26 DIAGNOSIS — E1165 Type 2 diabetes mellitus with hyperglycemia: Secondary | ICD-10-CM

## 2021-05-26 DIAGNOSIS — F32A Depression, unspecified: Secondary | ICD-10-CM | POA: Diagnosis not present

## 2021-05-26 LAB — POCT GLYCOSYLATED HEMOGLOBIN (HGB A1C): Hemoglobin A1C: 8.6 % — AB (ref 4.0–5.6)

## 2021-05-26 MED ORDER — KETOCONAZOLE 2 % EX CREA: 1.0000 "application " | TOPICAL_CREAM | Freq: Every day | CUTANEOUS | 0 refills | Status: DC | PRN

## 2021-05-26 MED ORDER — EMPAGLIFLOZIN 10 MG PO TABS: 10.0000 mg | ORAL_TABLET | Freq: Every day | ORAL | 1 refills | Status: DC

## 2021-05-26 MED ORDER — METFORMIN HCL ER 500 MG PO TB24
ORAL_TABLET | ORAL | 1 refills | Status: DC
Start: 2021-05-26 — End: 2022-01-12

## 2021-05-26 NOTE — Patient Instructions (Signed)
Nice to see you. Please try the coupon card for the Jardiance. Please continue your metformin. If you change your mind regarding medication for your anxiety and depression please let me know.

## 2021-05-26 NOTE — Assessment & Plan Note (Addendum)
Remains uncontrolled.  We will add on Jardiance 10 mg once daily.  He will continue metformin XR 1000 mg daily.  He was provided with a coupon card for the [place].  If this is not affordable he will let us know.  He was advised to monitor for UTI symptoms while being on Jardiance.  I also discussed sick day rules advising the patient to stop the Jardiance if he developed an illness with vomiting, diarrhea, or poor oral intake.  Topical ketoconazole sent to pharmacy to help with the rash on this develops under his arms.  I do suspect his uncontrolled diabetes is playing a little odor and crash.

## 2021-05-26 NOTE — Assessment & Plan Note (Signed)
This continues to be an issue though has improved to a certain degree.  Patient continues to decline medication and therapy.  He knows he is going to have to make a decision on his own what to do with his life to help with his anxiety and depression.

## 2021-05-26 NOTE — Progress Notes (Signed)
William Rumps, MD Phone: [phone]  William Frey is a 50 y.o. male who presents today for f/u.  DIABETES Disease Monitoring: Blood Sugar ranges-140-170 am Polyuria/phagia/dipsia-notes some polyuria and polydipsia      Optho-up-to-date Medications: Compliance-taking metformin hypoglycemic symptoms-no Patient does note when he eats poorly his urine has a stronger odor.  He notes he does not feel good when his sugar is high.  He notes he eats a lot at nighttime.  He notes at times his underarms have a bad odor and then developed a rash.  Anxiety/depression: Patient notes this is better than it was before.  He still has a lot going on in his life and notes he will need to make a decision in his life if he wants things to get better.  He notes these are hard decisions to make and he is still considering things.  He notes no panic attacks.  No SI or HI.  He does not want any medication for this.  Social History   Tobacco Use  Smoking Status Former   Packs/day: 2.00   Years: 25.00   Pack years: 50.00   Types: Cigarettes   Quit date: 09/17/2013   Years since quitting: 7.6  Smokeless Tobacco Never    Current Outpatient Medications on File Prior to Visit  Medication Sig Dispense Refill   ANDROGEL PUMP 20.25 MG/ACT (1.62%) GEL Apply 4 Doses topically daily. Use 4 pumps (2 on each shoulder) daily. 88 g 5   diazepam (VALIUM) 5 MG tablet Take 1 tablet (5 mg total) by mouth daily as needed for anxiety. 10 tablet 0   glucose blood test strip 1 each by Other route as needed for other (true matrix glucose test strips. E11.9). Use as instructed     Lancets (ONETOUCH ULTRASOFT) lancets Use as instructed 100 each 12   No current facility-administered medications on file prior to visit.     ROS see history of present illness  Objective  Physical Exam Vitals:   05/26/21 0930  BP: 120/80  Pulse: 74  Temp: 98.3 F (36.8 C)  SpO2: 96%    BP Readings from Last 3 Encounters:   05/26/21 120/80  04/23/21 123/80  02/10/21 110/86   Wt Readings from Last 3 Encounters:  05/26/21 250 lb 12.8 oz (113.8 kg)  04/23/21 245 lb (111.1 kg)  02/10/21 249 lb (112.9 kg)    Physical Exam Constitutional:      General: He is not in acute distress.    Appearance: He is not diaphoretic.  Cardiovascular:     Rate and Rhythm: Normal rate and regular rhythm.     Heart sounds: Normal heart sounds.  Pulmonary:     Effort: Pulmonary effort is normal.     Breath sounds: Normal breath sounds.  Skin:    General: Skin is warm and dry.  Neurological:     Mental Status: He is alert.     Assessment/Plan: Please see individual problem list.  Problem List Items Addressed This Visit     Anxiety and depression    This continues to be an issue though has improved to a certain degree.  Patient continues to decline medication and therapy.  He knows he is going to have to make a decision on his own what to do with his life to help with his anxiety and depression.       Type 2 diabetes mellitus with hyperglycemia, without long-term current use of insulin ([place]) - Primary    Remains uncontrolled.  We will add on Jardiance 10 mg once daily.  He will continue metformin XR 1000 mg daily.  He was provided with a coupon card for the [place].  If this is not affordable he will let us know.  He was advised to monitor for UTI symptoms while being on Jardiance.  I also discussed sick day rules advising the patient to stop the Jardiance if he developed an illness with vomiting, diarrhea, or poor oral intake.  Topical ketoconazole sent to pharmacy to help with the rash on this develops under his arms.  I do suspect his uncontrolled diabetes is playing a little odor and crash.       Relevant Medications   metFORMIN (GLUCOPHAGE-XR) 500 MG 24 hr tablet   empagliflozin (JARDIANCE) 10 MG TABS tablet   Other Relevant Orders   POCT HgB A1C (Completed)   Other Visit Diagnoses     Type 2 diabetes  mellitus without complication, without long-term current use of insulin (HCC)       Relevant Medications   metFORMIN (GLUCOPHAGE-XR) 500 MG 24 hr tablet   empagliflozin (JARDIANCE) 10 MG TABS tablet       Return in about 3 months (around 08/26/2021) for Diabetes.  This visit occurred during the SARS-CoV-2 public health emergency.  Safety protocols were in place, including screening questions prior to the visit, additional usage of staff PPE, and extensive cleaning of exam room while observing appropriate contact time as indicated for disinfecting solutions.    William Rumps, MD [place]

## 2021-08-27 ENCOUNTER — Ambulatory Visit: Payer: 59 | Admitting: Family Medicine

## 2021-10-01 ENCOUNTER — Telehealth: Payer: Self-pay | Admitting: Family Medicine

## 2021-10-01 NOTE — Telephone Encounter (Signed)
Pt called in complaining of chest tightness and he is experiencing anxiety. No available appt. Transfer Pt to access nurse

## 2021-10-04 NOTE — Telephone Encounter (Signed)
Patient was scheduled to see the provider this week, to discuss. Lleyton Byers,cma

## 2021-10-07 ENCOUNTER — Ambulatory Visit: Payer: 59 | Admitting: Family Medicine

## 2021-10-20 ENCOUNTER — Other Ambulatory Visit: Payer: Self-pay

## 2021-10-20 DIAGNOSIS — R7989 Other specified abnormal findings of blood chemistry: Secondary | ICD-10-CM

## 2021-10-20 DIAGNOSIS — E349 Endocrine disorder, unspecified: Secondary | ICD-10-CM

## 2021-10-21 ENCOUNTER — Other Ambulatory Visit: Payer: Self-pay

## 2021-10-22 NOTE — Progress Notes (Deleted)
10/23/2021 2:35 PM   William Frey 1971/06/04 [id number]  Referring provider: Leone Haven, MD [address] [place],  [place] [address]  No chief complaint on file.   Urological history: 1. Nephrolithiasis -URS for right 1.8 cm stone 2019   2. Testosterone deficiency -contributing factors of age, HTN and DM -testosterone level pending -H & H pending  -manage with testosterone gel 1.62%, 4 pumps daily  3. ED -contributing factors of age, DM, testosterone deficiency, HTN, anxiety and depression -SHIM ***  4. BPH with LU TS -PSA pending -I PSS *** -    HPI: William Frey is a 50 y.o. male who presents today for a 6 months follow up.      Score:  1-7 Mild 8-19 Moderate 20-35 Severe        Score: 1-7 Severe ED 8-11 Moderate ED 12-16 Mild-Moderate ED 17-21 Mild ED 22-25 No ED   PMH: Past Medical History:  Diagnosis Date   Allergy    Anxiety    Arthritis    Asthma    Chicken pox    Complication of anesthesia    difficult to wake up   COVID-19    11/26/20 sp MAB infusion   Diabetes mellitus without complication (HCC)    Family history of adverse reaction to anesthesia    father had low tolerance for anesthesia   GERD (gastroesophageal reflux disease)    History of kidney stones    Hyperlipidemia    Hypertension    Low testosterone    Migraines     Surgical History: Past Surgical History:  Procedure Laterality Date   CYSTOSCOPY W/ RETROGRADES Right 10/28/2018   Procedure: CYSTOSCOPY WITH RETROGRADE PYELOGRAM;  Surgeon: Billey Co, MD;  Location: [facility] ORS;  Service: Urology;  Laterality: Right;   CYSTOSCOPY/URETEROSCOPY/HOLMIUM LASER/STENT PLACEMENT Right 10/28/2018   Procedure: CYSTOSCOPY/URETEROSCOPY/HOLMIUM LASER/STENT PLACEMENT;  Surgeon: Billey Co, MD;  Location: [facility] ORS;  Service: Urology;  Laterality: Right;   OSTECTOMY METATARSAL Right 2003    Home Medications:  Allergies as of  10/23/2021       Reactions   Penicillins Hives   Childhood allergy Has patient had a PCN reaction causing immediate rash, facial/tongue/throat swelling, SOB or lightheadedness with hypotension: No Has patient had a PCN reaction causing severe rash involving mucus membranes or skin necrosis: No Has patient had a PCN reaction that required hospitalization: No Has patient had a PCN reaction occurring within the last 10 years: No If all of the above answers are "NO", then may proceed with Cephalosporin use.        Medication List        Accurate as of October 22, 2021  2:35 PM. If you have any questions, ask your nurse or doctor.          AndroGel Pump 20.25 MG/ACT (1.62%) Gel Generic drug: Testosterone Apply 4 Doses topically daily. Use 4 pumps (2 on each shoulder) daily.   diazepam 5 MG tablet Commonly known as: VALIUM Take 1 tablet (5 mg total) by mouth daily as needed for anxiety.   empagliflozin 10 MG Tabs tablet Commonly known as: JARDIANCE Take 1 tablet (10 mg total) by mouth daily before breakfast.   glucose blood test strip 1 each by Other route as needed for other (true matrix glucose test strips. E11.9). Use as instructed   ketoconazole 2 % cream Commonly known as: NIZORAL Apply 1 application topically daily as needed for irritation (Rash).   metFORMIN  500 MG 24 hr tablet Commonly known as: GLUCOPHAGE-XR TAKE TWO TABLETS ONCE A DAY WITH BREAKFAST   onetouch ultrasoft lancets Use as instructed        Allergies:  Allergies  Allergen Reactions   Penicillins Hives    Childhood allergy Has patient had a PCN reaction causing immediate rash, facial/tongue/throat swelling, SOB or lightheadedness with hypotension: No Has patient had a PCN reaction causing severe rash involving mucus membranes or skin necrosis: No Has patient had a PCN reaction that required hospitalization: No Has patient had a PCN reaction occurring within the last 10 years: No If all of  the above answers are "NO", then may proceed with Cephalosporin use.     Family History: Family History  Problem Relation Age of Onset   Arthritis Mother    Cancer Mother        breast and bone   Heart Problems Mother    Arthritis Father    Lymphoma Father    Heart disease Father    Stroke Father    Hypertension Father    Clotting disorder Father    Cancer Brother        lung   Heart disease Brother        atriall fibrillation   Diabetes Brother    Heart disease Brother        atrial fibrillation   Diabetes Maternal Aunt    Diabetes Maternal Aunt     Social History:  reports that he quit smoking about 8 years ago. His smoking use included cigarettes. He has a 50.00 pack-year smoking history. He has never used smokeless tobacco. He reports that he does not drink alcohol and does not use drugs.  ROS: Pertinent ROS in HPI  Physical Exam: There were no vitals taken for this visit.  Constitutional:  Well nourished. Alert and oriented, No acute distress. HEENT: [place] AT, moist mucus membranes.  Trachea midline Cardiovascular: No clubbing, cyanosis, or edema. Respiratory: Normal respiratory effort, no increased work of breathing. GI: Abdomen is soft, non tender, non distended, no abdominal masses. Liver and spleen not palpable.  No hernias appreciated.  Stool sample for occult testing is not indicated.   GU: No CVA tenderness.  No bladder fullness or masses.  Patient with circumcised/uncircumcised phallus. ***Foreskin easily retracted***  Urethral meatus is patent.  No penile discharge. No penile lesions or rashes. Scrotum without lesions, cysts, rashes and/or edema.  Testicles are located scrotally bilaterally. No masses are appreciated in the testicles. Left and right epididymis are normal. Rectal: Patient with  normal sphincter tone. Anus and perineum without scarring or rashes. No rectal masses are appreciated. Prostate is approximately *** grams, *** nodules are appreciated.  Seminal vesicles are normal. Skin: No rashes, bruises or suspicious lesions. Lymph: No inguinal adenopathy. Neurologic: Grossly intact, no focal deficits, moving all 4 extremities. Psychiatric: Normal mood and affect.   Laboratory Data: Results for orders placed or performed in visit on 05/26/21  POCT HgB A1C  Result Value Ref Range   Hemoglobin A1C 8.6 (A) 4.0 - 5.6 %   HbA1c POC (<> result, manual entry)     HbA1c, POC (prediabetic range)     HbA1c, POC (controlled diabetic range)    I have reviewed the labs.   Pertinent Imaging: No recent imaging  Assessment & Plan:    1. Testosterone deficiency -At therapeutic levels -Continue AndroGel 1.62% at 4 pumps daily -refilled AndroGel 1.62%, 4 pumps  2. BPH with LUTS -PSA stable -DRE benign -UA  benign -PVR < 300 cc -symptoms - *** -most bothersome symptoms are *** -continue conservative management, avoiding bladder irritants and timed voiding's -Initiate alpha-blocker (***), discussed side effects *** -Initiate 5 alpha reductase inhibitor (***), discussed side effects *** -Continue tamsulosin 0.4 mg daily, alfuzosin 10 mg daily, Rapaflo 8 mg daily, terazosin, doxazosin, Cialis 5 mg daily and finasteride 5 mg daily, dutasteride 0.5 mg daily***:refills given -Cannot tolerate medication or medication failure, schedule cystoscopy ***     No follow-ups on file.  These notes generated with voice recognition software. I apologize for typographical errors.  Zara Council, PA-C  [facility] [address]  [place], [place] [address] [phone]

## 2021-10-23 ENCOUNTER — Encounter: Payer: Self-pay | Admitting: Urology

## 2021-10-23 ENCOUNTER — Ambulatory Visit: Payer: 59 | Admitting: Urology

## 2021-10-23 DIAGNOSIS — N138 Other obstructive and reflux uropathy: Secondary | ICD-10-CM

## 2021-10-23 DIAGNOSIS — E349 Endocrine disorder, unspecified: Secondary | ICD-10-CM

## 2021-10-27 ENCOUNTER — Encounter: Payer: Self-pay | Admitting: Emergency Medicine

## 2021-10-27 ENCOUNTER — Other Ambulatory Visit: Payer: Self-pay

## 2021-10-27 ENCOUNTER — Ambulatory Visit
Admission: EM | Admit: 2021-10-27 | Discharge: 2021-10-27 | Disposition: A | Discharge status: Home / Self Care | Payer: 59 | Attending: Family Medicine | Admitting: Family Medicine

## 2021-10-27 DIAGNOSIS — M25429 Effusion, unspecified elbow: Secondary | ICD-10-CM

## 2021-10-27 DIAGNOSIS — L03114 Cellulitis of left upper limb: Secondary | ICD-10-CM | POA: Diagnosis not present

## 2021-10-27 MED ORDER — SULFAMETHOXAZOLE-TRIMETHOPRIM 800-160 MG PO TABS: 1.0000 | ORAL_TABLET | Freq: Two times a day (BID) | ORAL | 0 refills | Status: DC

## 2021-10-27 MED ORDER — DOXYCYCLINE HYCLATE 100 MG PO CAPS: 100.0000 mg | ORAL_CAPSULE | Freq: Two times a day (BID) | ORAL | 0 refills | Status: DC

## 2021-10-27 NOTE — ED Triage Notes (Signed)
Pt here with left elbow swelling and erythema x 4 days. Painful to move or touch.

## 2021-10-27 NOTE — ED Provider Notes (Signed)
William Frey    CSN: [id number] Arrival date & time: 10/27/21  1443      History   Chief Complaint Chief Complaint  Patient presents with   Joint Swelling    HPI William Frey is a 50 y.o. male.   HPI Patient with a history of type 2 diabetes presents today with left elbow swelling and redness x4 days.  Patient reports that he has a skin condition which results in him having a small open wounds.  He has a small wound that has subsequently healed on the lateral aspect of his left elbow and he is concerned that this may have been the source of the infection.  He reports that he elbow has become increasingly warm, tender and swelling has occurred intermittently.  He reports yesterday the swelling was significant however today the swelling has decreased however the redness and tenderness remains.  He is afebrile.  Reports a fall over 2 weeks ago in which she had no subsequent pain following the fall therefore he does not recall injuring his elbow.   Past Medical History:  Diagnosis Date   Allergy    Anxiety    Arthritis    Asthma    Chicken pox    Complication of anesthesia    difficult to wake up   COVID-19    11/26/20 sp MAB infusion   Diabetes mellitus without complication ([place])    Family history of adverse reaction to anesthesia    father had low tolerance for anesthesia   GERD (gastroesophageal reflux disease)    History of kidney stones    Hyperlipidemia    Hypertension    Low testosterone    Migraines     Patient Active Problem List   Diagnosis Date Noted   History of COVID-19 02/10/2021   Insomnia 12/27/2020   Panic attack 12/27/2020   Left flank pain 10/28/2020   Vitamin D deficiency 10/28/2020   Detachment of glenoid labrum 07/05/2020   Lumbar sprain 07/05/2020   Neck sprain 07/05/2020   Neuropathy 06/28/2020   Hypogonadism in male 06/28/2020   Nephrolithiasis 11/12/2018   Proteinuria 11/12/2018   Screen for STD (sexually transmitted  disease) 05/04/2018   Anxiety and depression 11/30/2017   Angiokeratoma of [place] 12/24/2016   Chest pain 11/15/2016   Essential hypertension 11/15/2016   Benign localized hyperplasia of prostate with urinary obstruction 05/15/2016   Hyperlipidemia 12/23/2015   History of nephrolithiasis 11/03/2015   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   Reduced libido 10/31/2015   Type 2 diabetes mellitus with hyperglycemia, without long-term current use of insulin ([place]) 09/23/2015   Low testosterone 09/23/2015   Preventative health care 09/18/2015   Obesity (BMI 30-39.9) 09/18/2015   Elevated blood-pressure reading without diagnosis of hypertension 09/18/2015    Past Surgical History:  Procedure Laterality Date   CYSTOSCOPY W/ RETROGRADES Right 10/28/2018   Procedure: CYSTOSCOPY WITH RETROGRADE PYELOGRAM;  Surgeon: Billey Co, MD;  Location: [facility];  Service: Urology;  Laterality: Right;   CYSTOSCOPY/URETEROSCOPY/HOLMIUM LASER/STENT PLACEMENT Right 10/28/2018   Procedure: CYSTOSCOPY/URETEROSCOPY/HOLMIUM LASER/STENT PLACEMENT;  Surgeon: Billey Co, MD;  Location: [facility] OR[facility];  Service: Urology;  Laterality: Right;   OSTECTOMY METATARSAL Right 2003       Home Medications    Prior to Admission medications   Medication Sig Start Date End Date Taking? Authorizing Provider  doxycycline (VIBRAMYCIN) 100 MG capsule Take 1 capsule (100 mg total) by mouth 2 (two) times daily. 10/27/21  Yes Scot Jun, FNP  ANDROGEL  PUMP 20.25 MG/ACT (1.62%) GEL Apply 4 Doses topically daily. Use 4 pumps (2 on each shoulder) daily. 04/30/21   Zara Council A, PA-C  diazepam (VALIUM) 5 MG tablet Take 1 tablet (5 mg total) by mouth daily as needed for anxiety. 02/10/21   Leone Haven, MD  empagliflozin (JARDIANCE) 10 MG TABS tablet Take 1 tablet (10 mg total) by mouth daily before breakfast. 05/26/21   Leone Haven, MD  glucose blood test strip 1 each by Other route as needed for other (true matrix  glucose test strips. E11.9). Use as instructed    [provider]  ketoconazole (NIZORAL) 2 % cream Apply 1 application topically daily as needed for irritation (Rash). 05/26/21   Leone Haven, MD  Lancets [facility] ULTRASOFT) lancets Use as instructed 09/23/15   Coral Spikes, DO  metFORMIN (GLUCOPHAGE-XR) 500 MG 24 hr tablet TAKE TWO TABLETS ONCE A DAY WITH BREAKFAST 05/26/21   Leone Haven, MD    Family History Family History  Problem Relation Age of Onset   Arthritis Mother    Cancer Mother        breast and bone   Heart Problems Mother    Arthritis Father    Lymphoma Father    Heart disease Father    Stroke Father    Hypertension Father    Clotting disorder Father    Cancer Brother        lung   Heart disease Brother        atriall fibrillation   Diabetes Brother    Heart disease Brother        atrial fibrillation   Diabetes Maternal Aunt    Diabetes Maternal Aunt     Social History Social History   Tobacco Use   Smoking status: Former    Packs/day: 2.00    Years: 25.00    Pack years: 50.00    Types: Cigarettes    Quit date: 09/17/2013    Years since quitting: 8.1   Smokeless tobacco: Never  Vaping Use   Vaping Use: Never used  Substance Use Topics   Alcohol use: No    Alcohol/week: 0.0 standard drinks    Comment: former   Drug use: No     Allergies   Penicillins   Review of Systems Review of Systems Pertinent negatives listed in HPI   Physical Exam Triage Vital Signs ED Triage Vitals  Enc Vitals Group     BP 10/27/21 1555 (!) 143/98     Pulse Rate 10/27/21 1555 100     Resp 10/27/21 1555 18     Temp 10/27/21 1555 98.2 F (36.8 C)     Temp src --      SpO2 10/27/21 1555 98 %     Weight --      Height --      Head Circumference --      Peak Flow --      Pain Score 10/27/21 1556 7     Pain Loc --      Pain Edu? --      Excl. in [place]? --    No data found.  Updated Vital Signs BP (!) 143/98    Pulse 100    Temp 98.2  F (36.8 C)    Resp 18    SpO2 98%   Visual Acuity Right Eye Distance:   Left Eye Distance:   Bilateral Distance:    Right Eye Near:   Left Eye Near:  Bilateral Near:     Physical Exam Constitutional:      Appearance: Normal appearance.  HENT:     Head: Normocephalic and atraumatic.  Cardiovascular:     Rate and Rhythm: Normal rate and regular rhythm.  Pulmonary:     Effort: Pulmonary effort is normal.     Breath sounds: Normal breath sounds and air entry.  Musculoskeletal:       Arms:  Neurological:     Mental Status: He is alert.     GCS: GCS eye subscore is 4. GCS verbal subscore is 5. GCS motor subscore is 6.  Psychiatric:        Attention and Perception: Attention normal.        Mood and Affect: Mood normal.     UC Treatments / Results  Labs (all labs ordered are listed, but only abnormal results are displayed) Labs Reviewed - No data to display  EKG   Radiology No results found.  Procedures Procedures (including critical care time)  Medications Ordered in UC Medications - No data to display  Initial Impression / Assessment and Plan / UC Course  I have reviewed the triage vital signs and the nursing notes.  Pertinent labs & imaging results that were available during my care of the patient were reviewed by me and considered in my medical decision making (see chart for details).    Soft tissue swelling of the left elbow joint cellulitis involving the left upper extremity,  Patient is discharge paperwork was printed and Bactrim was ordered during the discharge instruction he informed the nurse that Bactrim did not work.  He was previously asked this information by the provider and did not disclose any prior issues with Bactrim.  Bactrim discontinued doxycycline prescribed.  Ace wrap and elevation recommended.  Red flags indicating need to follow-up at the ER provided on discharge paperwork.  Return as needed. Final Clinical Impressions(s) / UC Diagnoses    Final diagnoses:  Soft tissue swelling of elbow joint  Cellulitis of left upper extremity     Discharge Instructions      Start Bactrim take for 10 days. Keep left extremity elevated and wrapped in ACE wrap to reduce swelling and prevent hitting elbow. Tylenol or Naproxen (over the counter) as needed for pain. If you develop, fever, chills, or worsening redness, go immediately to the Emergency department.      ED Prescriptions     Medication Sig Dispense Auth. Provider   sulfamethoxazole-trimethoprim (BACTRIM DS) 800-160 MG tablet  (Status: Discontinued) Take 1 tablet by mouth 2 (two) times daily. 20 tablet Scot Jun, FNP   doxycycline (VIBRAMYCIN) 100 MG capsule Take 1 capsule (100 mg total) by mouth 2 (two) times daily. 20 capsule Scot Jun, FNP      PDMP not reviewed this encounter.   Scot Jun, FNP 10/28/21 1329

## 2021-10-27 NOTE — Discharge Instructions (Addendum)
Start Bactrim take for 10 days. Keep left extremity elevated and wrapped in ACE wrap to reduce swelling and prevent hitting elbow. Tylenol or Naproxen (over the counter) as needed for pain. If you develop, fever, chills, or worsening redness, go immediately to the Emergency department.

## 2021-11-12 ENCOUNTER — Encounter: Payer: Self-pay | Admitting: Adult Health

## 2021-11-12 ENCOUNTER — Other Ambulatory Visit: Payer: Self-pay

## 2021-11-12 ENCOUNTER — Ambulatory Visit (INDEPENDENT_AMBULATORY_CARE_PROVIDER_SITE_OTHER): Payer: No Typology Code available for payment source

## 2021-11-12 ENCOUNTER — Ambulatory Visit: Payer: No Typology Code available for payment source | Admitting: Adult Health

## 2021-11-12 VITALS — BP 122/84 | HR 81 | Ht 74.02 in | Wt 242.4 lb

## 2021-11-12 DIAGNOSIS — M7022 Olecranon bursitis, left elbow: Secondary | ICD-10-CM | POA: Diagnosis not present

## 2021-11-12 LAB — CBC WITH DIFFERENTIAL/PLATELET
Basophils Absolute: 0.1 10*3/uL (ref 0.0–0.1)
Basophils Relative: 1.1 % (ref 0.0–3.0)
Eosinophils Absolute: 0.1 10*3/uL (ref 0.0–0.7)
Eosinophils Relative: 1.4 % (ref 0.0–5.0)
HCT: 44.7 % (ref 39.0–52.0)
Hemoglobin: 14.9 g/dL (ref 13.0–17.0)
Lymphocytes Relative: 34.7 % (ref 12.0–46.0)
Lymphs Abs: 2.3 10*3/uL (ref 0.7–4.0)
MCHC: 33.4 g/dL (ref 30.0–36.0)
MCV: 89.2 fl (ref 78.0–100.0)
Monocytes Absolute: 0.4 10*3/uL (ref 0.1–1.0)
Monocytes Relative: 6.3 % (ref 3.0–12.0)
Neutro Abs: 3.7 10*3/uL (ref 1.4–7.7)
Neutrophils Relative %: 56.5 % (ref 43.0–77.0)
Platelets: 214 10*3/uL (ref 150.0–400.0)
RBC: 5.01 Mil/uL (ref 4.22–5.81)
RDW: 13.2 % (ref 11.5–15.5)
WBC: 6.5 10*3/uL (ref 4.0–10.5)

## 2021-11-12 LAB — COMPREHENSIVE METABOLIC PANEL
ALT: 14 U/L (ref 0–53)
AST: 12 U/L (ref 0–37)
Albumin: 4.1 g/dL (ref 3.5–5.2)
Alkaline Phosphatase: 53 U/L (ref 39–117)
BUN: 19 mg/dL (ref 6–23)
CO2: 24 mEq/L (ref 19–32)
Calcium: 8.8 mg/dL (ref 8.4–10.5)
Chloride: 102 mEq/L (ref 96–112)
Creatinine, Ser: 1.16 mg/dL (ref 0.40–1.50)
GFR: 74 mL/min (ref >60.00)
Glucose, Bld: 255 mg/dL — ABNORMAL HIGH (ref 70–99)
Potassium: 4 mEq/L (ref 3.5–5.1)
Sodium: 136 mEq/L (ref 135–145)
Total Bilirubin: 0.7 mg/dL (ref 0.2–1.2)
Total Protein: 7.2 g/dL (ref 6.0–8.3)

## 2021-11-12 MED ORDER — CLARITHROMYCIN 500 MG PO TABS: 500.0000 mg | ORAL_TABLET | Freq: Two times a day (BID) | ORAL | 0 refills | Status: DC

## 2021-11-12 NOTE — Progress Notes (Signed)
Negative elbow x ray.  Follow treatment plan from office  if not improving or any worsening within 72 hours and also return to office or open medical facility at ANYTIME if any symptoms persist, change, or worsen or you have any further concerns or questions. Call 911 immediately for emergencies.

## 2021-11-12 NOTE — Progress Notes (Signed)
/  New Patient Office Visit  Subjective:  Patient ID: William Frey, male    DOB: October 11, 1971  Age: 51 y.o. MRN: [id number]  CC:  Chief Complaint  Patient presents with   Follow-up    HPI [name] presents for recent treatment of soft tissue swelling and cellulitis of the left arm with doxycycline.he was seen for left  arm cellulitis at [facility] on 10/27/21 soft tissue swelling of elbow joint and cellulitis of left upper extremity.   Elbow still warm and swollen. Arm is improved he reports he previously had spreading erythema up and down his arm this has resolved and only elbow area has fluid and swollen.  Did have fall over 3 weeks ago , no subsequent pain or known injury to elbow. Denies  any head injury.  Arm is painful.  Denies any other issues concerns or problems.  Last CMP 06/28/20 only glucose elevated.   Patient  denies any fever, body,chills, rash, chest pain, shortness of breath, nausea, vomiting, or diarrhea.  Denies dizziness, lightheadedness, pre syncopal or syncopal episodes.   Past Medical History:  Diagnosis Date   Allergy    Anxiety    Arthritis    Asthma    Chicken pox    Complication of anesthesia    difficult to wake up   COVID-19    11/26/20 sp MAB infusion   Diabetes mellitus without complication ([facility])    Family history of adverse reaction to anesthesia    father had low tolerance for anesthesia   GERD (gastroesophageal reflux disease)    History of kidney stones    Hyperlipidemia    Hypertension    Low testosterone    Migraines     Past Surgical History:  Procedure Laterality Date   CYSTOSCOPY W/ RETROGRADES Right 10/28/2018   Procedure: CYSTOSCOPY WITH RETROGRADE PYELOGRAM;  Surgeon: Billey Co, MD;  Location: [facility] ORS;  Service: Urology;  Laterality: Right;   CYSTOSCOPY/URETEROSCOPY/HOLMIUM LASER/STENT PLACEMENT Right 10/28/2018   Procedure: CYSTOSCOPY/URETEROSCOPY/HOLMIUM LASER/STENT PLACEMENT;  Surgeon: Billey Co, MD;   Location: [facility] ORS;  Service: Urology;  Laterality: Right;   OSTECTOMY METATARSAL Right 2003    Family History  Problem Relation Age of Onset   Arthritis Mother    Cancer Mother        breast and bone   Heart Problems Mother    Arthritis Father    Lymphoma Father    Heart disease Father    Stroke Father    Hypertension Father    Clotting disorder Father    Cancer Brother        lung   Heart disease Brother        atriall fibrillation   Diabetes Brother    Heart disease Brother        atrial fibrillation   Diabetes Maternal Aunt    Diabetes Maternal Aunt     Social History   Socioeconomic History   Marital status: Married    Spouse name: Not on file   Number of children: Not on file   Years of education: Not on file   Highest education level: Not on file  Occupational History   Not on file  Tobacco Use   Smoking status: Former    Packs/day: 2.00    Years: 25.00    Pack years: 50.00    Types: Cigarettes    Quit date: 09/17/2013    Years since quitting: 8.1   Smokeless tobacco: Never  Vaping Use  Vaping Use: Never used  Substance and Sexual Activity   Alcohol use: No    Alcohol/week: 0.0 standard drinks    Comment: former   Drug use: No   Sexual activity: Yes  Other Topics Concern   Not on file  Social History Narrative   Married          Social Determinants of Health   Financial Resource Strain: Not on file  Food Insecurity: Not on file  Transportation Needs: Not on file  Physical Activity: Not on file  Stress: Not on file  Social Connections: Not on file  Intimate Partner Violence: Not on file    ROS Review of Systems  Constitutional: Negative.   HENT: Negative.    Eyes: Negative.   Respiratory: Negative.    Cardiovascular: Negative.   Gastrointestinal: Negative.   Genitourinary: Negative.   Musculoskeletal:  Positive for joint swelling. Negative for arthralgias, back pain, gait problem, myalgias, neck pain and neck stiffness.  Skin:   Positive for color change. Negative for pallor, rash and wound.  Neurological: Negative.   Hematological: Negative.   Psychiatric/Behavioral: Negative.     Objective:   Today's Vitals: BP 122/84    Pulse 81    Ht 6' 2.02" (1.88 m)    Wt 242 lb 6.4 oz (110 kg)    SpO2 96%    BMI 31.11 kg/m   Physical Exam Vitals reviewed.  Constitutional:      General: He is not in acute distress.    Appearance: He is obese. He is not ill-appearing, toxic-appearing or diaphoretic.  Neck:     Vascular: No carotid bruit.  Cardiovascular:     Rate and Rhythm: Normal rate and regular rhythm.     Pulses: Normal pulses.     Heart sounds: Normal heart sounds. No murmur heard.   No friction rub. No gallop.  Pulmonary:     Effort: Pulmonary effort is normal. No respiratory distress.     Breath sounds: Normal breath sounds. No stridor. No wheezing, rhonchi or rales.  Chest:     Chest wall: No tenderness.  Abdominal:     General: There is no distension.     Palpations: Abdomen is soft. There is no mass.     Tenderness: There is no abdominal tenderness. There is no guarding.  Musculoskeletal:        General: Swelling and tenderness present. No deformity or signs of injury. Normal range of motion.     Right shoulder: Normal.     Left shoulder: Normal.     Right upper arm: Normal.     Left upper arm: Normal.     Right elbow: Normal.     Left elbow: Effusion present. No swelling, deformity or lacerations. Tenderness present in medial epicondyle.     Right forearm: Normal.     Left forearm: Normal.     Right wrist: Normal. Normal pulse.     Left wrist: Normal. Normal pulse.     Right hand: Normal.     Left hand: Normal.     Cervical back: Normal range of motion and neck supple. No rigidity or tenderness.     Right lower leg: No edema.     Left lower leg: No edema.  Lymphadenopathy:     Cervical: No cervical adenopathy.  Skin:    General: Skin is warm.     Coloration: Skin is not jaundiced or pale.      Findings: Erythema (left elbow mild erythema and warmth,  3cm soft effusion, tender.) present. No bruising, lesion or rash.  Neurological:     Mental Status: He is alert and oriented to person, place, and time.     Motor: No weakness.     Gait: Gait normal.     Deep Tendon Reflexes: Reflexes normal.  Psychiatric:        Mood and Affect: Mood normal.        Behavior: Behavior normal.        Thought Content: Thought content normal.        Judgment: Judgment normal.    Assessment & Plan:   Problem List Items Addressed This Visit   None Visit Diagnoses     Olecranon bursitis of left elbow    -  Primary   Relevant Medications   clarithromycin (BIAXIN) 500 MG tablet   Other Relevant Orders   CBC with Differential/Platelet (Completed)   Comprehensive metabolic panel (Completed)   DG Elbow Complete Left (Completed)       Outpatient Encounter Medications as of 11/12/2021  Medication Sig   ANDROGEL PUMP 20.25 MG/ACT (1.62%) GEL Apply 4 Doses topically daily. Use 4 pumps (2 on each shoulder) daily.   clarithromycin (BIAXIN) 500 MG tablet Take 1 tablet (500 mg total) by mouth 2 (two) times daily.   diazepam (VALIUM) 5 MG tablet Take 1 tablet (5 mg total) by mouth daily as needed for anxiety.   empagliflozin (JARDIANCE) 10 MG TABS tablet Take 1 tablet (10 mg total) by mouth daily before breakfast.   glucose blood test strip 1 each by Other route as needed for other (true matrix glucose test strips. E11.9). Use as instructed   ketoconazole (NIZORAL) 2 % cream Apply 1 application topically daily as needed for irritation (Rash).   Lancets (ONETOUCH ULTRASOFT) lancets Use as instructed   metFORMIN (GLUCOPHAGE-XR) 500 MG 24 hr tablet TAKE TWO TABLETS ONCE A DAY WITH BREAKFAST   doxycycline (VIBRAMYCIN) 100 MG capsule Take 1 capsule (100 mg total) by mouth 2 (two) times daily. (Patient not taking: Reported on 11/12/2021)   No facility-administered encounter medications on file as of 11/12/2021.    Meds ordered this encounter  Medications   clarithromycin (BIAXIN) 500 MG tablet    Sig: Take 1 tablet (500 mg total) by mouth 2 (two) times daily.    Dispense:  20 tablet    Refill:  0  X-ray today.  CBC and CMP today. Will start secondary option clarithromycin per guidelines since he has an allergy to antibiotic penicillin for bursitis/cellulitis, advised patient of walk-in clinic at [facility] if swelling and pain continues he may need to have effusion drained.  He is aware of where this is located and that he can walk-in Monday through Friday up until 7 PM.  Red Flags discussed. The patient was given clear instructions to go to ER or return to medical center if any red flags develop, symptoms do not improve, worsen or new problems develop. They verbalized understanding.  Return in about 1 week (around 11/19/2021), or if symptoms worsen or fail to improve, for at any time for any worsening symptoms, Go to Emergency room/ urgent care if worse.  Follow-up: Return in about 1 week (around 11/19/2021), or if symptoms worsen or fail to improve, for at any time for any worsening symptoms, Go to Emergency room/ urgent care if worse.   Marcille Buffy, FNP

## 2021-11-12 NOTE — Patient Instructions (Signed)
Clarithromycin tablets What is this medication? CLARITHROMYCIN (kla RITH roe mye sin) is a macrolide antibiotic. It is used to treat or prevent certain kinds of bacterial infections. It will not work for colds, flu, or other viral infections. This medicine may be used for other purposes; ask your health care provider or pharmacist if you have questions. COMMON BRAND NAME(S): Biaxin What should I tell my care team before I take this medication? They need to know if you have any of these conditions: heart disease history of irregular heartbeat kidney disease liver disease myasthenia gravis an unusual or allergic reaction to clarithromycin, other macrolide antibiotics, other medicines, foods, dyes, or preservatives pregnant or trying to get pregnant breast-feeding How should I use this medication? Take this medicine by mouth with glass of water. If it upsets your stomach you can take it with milk or food. Follow the directions on the prescription label. Take your medicine at regular intervals. Do not take your medicine more often than directed. Take all of your medicine as directed even if you think your are better. Do not skip doses or stop your medicine early. Talk to your pediatrician regarding the use of this medicine in children. Special care may be needed. Overdosage: If you think you have taken too much of this medicine contact a poison control center or emergency room at once. NOTE: This medicine is only for you. Do not share this medicine with others. What if I miss a dose? If you miss a dose, take it as soon as you can. If it is almost time for your next dose, take only that dose. Do not take double or extra doses. What may interact with this medication? Do not take this medicine with any of the following medications: certain medicines for fungal infections like fluconazole, itraconazole, ketoconazole, posaconazole,  voriconazole cisapride dronedarone naloxegol pimozide thioridazine This medicine may also interact with the following medications: birth control pills carbamazepine certain medicines for anxiety or sleep like alprazolam, triazolam certain medicines for cholesterol like atorvastatin, lovastatin, simvastatin certain medicines for irregular heart beat like amiodarone, disopyramide, flecainide, procainamide, quinidine certain medicines that treat or prevent clots like warfarin colchicine cyclosporine digoxin dofetilide ergot alkaloids like ergotamine, dihydroergotamine other antibiotics like grepafloxacin, rifabutin, sparfloxacin other medicines that prolong the QT interval (cause an abnormal heart rhythm) ritonavir sildenafil terfenadine theophylline zidovudine ziprasidone This list may not describe all possible interactions. Give your health care provider a list of all the medicines, herbs, non-prescription drugs, or dietary supplements you use. Also tell them if you smoke, drink alcohol, or use illegal drugs. Some items may interact with your medicine. What should I watch for while using this medication? Tell your doctor or health care provider if your symptoms do not improve. This medicine may cause serious skin reactions. They can happen weeks to months after starting the medicine. Contact your health care provider right away if you notice fevers or flu-like symptoms with a rash. The rash may be red or purple and then turn into blisters or peeling of the skin. Or, you might notice a red rash with swelling of the face, lips or lymph nodes in your neck or under your arms. Do not treat diarrhea with over the counter products. Contact your doctor if you have diarrhea that lasts more than 2 days or if it is severe and watery. If you have diabetes, monitor your blood sugar carefully while on this medicine. What side effects may I notice from receiving this medication? Side effects that  you should report to your doctor or health care professional as soon as possible: allergic reactions like skin rash, itching or hives, swelling of the face, lips, or tongue irregular heartbeat or chest pain pain or difficulty passing urine rash, fever, and swollen lymph nodes redness, blistering, peeling or loosening of the skin, including inside the mouth yellowing of the eyes or skin Side effects that usually do not require medical attention (report to your doctor or health care professional if they continue or are bothersome): abnormal taste anxiety, confusion, or nightmares diarrhea headache intestinal gas stomach upset or nausea This list may not describe all possible side effects. Call your doctor for medical advice about side effects. You may report side effects to FDA at 1-800-FDA-1088. Where should I keep my medication? Keep out of the reach of children. Store at room temperature between 20 and 25 degrees C (68 and 77 degrees F). Keep container tightly closed. Protect from light. Throw away any unused medicine after the expiration date. NOTE: This sheet is a summary. It may not cover all possible information. If you have questions about this medicine, talk to your doctor, pharmacist, or health care provider.  2022 Elsevier/Gold Standard (2019-02-15 00:00:00)

## 2021-11-13 NOTE — Progress Notes (Signed)
CBC and CMP within normal limits glucose is elevated, last A1C is not controlled. I would recommend a CCM consult with Pharmacist Barnetta Chapel if he is in agreement and  follow up in the near future for diabetes.

## 2021-11-24 ENCOUNTER — Ambulatory Visit: Payer: No Typology Code available for payment source | Admitting: Adult Health

## 2021-11-27 ENCOUNTER — Ambulatory Visit (INDEPENDENT_AMBULATORY_CARE_PROVIDER_SITE_OTHER): Payer: No Typology Code available for payment source | Admitting: Adult Health

## 2021-11-27 DIAGNOSIS — Z91199 Patient's noncompliance with other medical treatment and regimen due to unspecified reason: Secondary | ICD-10-CM

## 2021-11-27 NOTE — Progress Notes (Signed)
   Complete physical exam  Patient: William Frey   DOB: 08/29/1998   51 y.o. Male  MRN: [id number]  Subjective:    No chief complaint on file.   William Frey is a 51 y.o. male who presents today for a complete physical exam. She reports consuming a {diet types:17450} diet. {types:19826} She generally feels {DESC; WELL/FAIRLY WELL/POORLY:18703}. She reports sleeping {DESC; WELL/FAIRLY WELL/POORLY:18703}. She {does/does not:200015} have additional problems to discuss today.    Most recent fall risk assessment:    05/06/2022   10:42 AM  Fall Risk   Falls in the past year? 0  Number falls in past yr: 0  Injury with Fall? 0  Risk for fall due to : No Fall Risks  Follow up Falls evaluation completed     Most recent depression screenings:    05/06/2022   10:42 AM 03/27/2021   10:46 AM  PHQ 2/9 Scores  PHQ - 2 Score 0 0  PHQ- 9 Score 5     {VISON DENTAL STD PSA (Optional):27386}  {History (Optional):23778}  Patient Care Team: Jessup, Joy, NP as PCP - General (Nurse Practitioner)   Outpatient Medications Prior to Visit  Medication Sig   fluticasone (FLONASE) 50 MCG/ACT nasal spray Place 2 sprays into both nostrils in the morning and at bedtime. After 7 days, reduce to once daily.   norgestimate-ethinyl estradiol (SPRINTEC 28) 0.25-35 MG-MCG tablet Take 1 tablet by mouth daily.   Nystatin POWD Apply liberally to affected area 2 times per day   spironolactone (ALDACTONE) 100 MG tablet Take 1 tablet (100 mg total) by mouth daily.   No facility-administered medications prior to visit.    ROS        Objective:     There were no vitals taken for this visit. {Vitals History (Optional):23777}  Physical Exam   No results found for any visits on 06/11/22. {Show previous labs (optional):23779}    Assessment & Plan:    Routine Health Maintenance and Physical Exam  Immunization History  Administered Date(s) Administered   DTaP 11/12/1999, 01/08/2000,  03/18/2000, 12/02/2000, 06/17/2004   Hepatitis A 04/13/2008, 04/19/2009   Hepatitis B 08/30/1999, 10/07/1999, 03/18/2000   HiB (PRP-OMP) 11/12/1999, 01/08/2000, 03/18/2000, 12/02/2000   IPV 11/12/1999, 01/08/2000, 09/06/2000, 06/17/2004   Influenza,inj,Quad PF,6+ Mos 07/20/2014   Influenza-Unspecified 10/19/2012   MMR 09/06/2001, 06/17/2004   Meningococcal Polysaccharide 04/18/2012   Pneumococcal Conjugate-13 12/02/2000   Pneumococcal-Unspecified 03/18/2000, 06/01/2000   Tdap 04/18/2012   Varicella 09/06/2000, 04/13/2008    Health Maintenance  Topic Date Due   HIV Screening  Never done   Hepatitis C Screening  Never done   INFLUENZA VACCINE  06/09/2022   PAP-Cervical Cytology Screening  06/11/2022 (Originally 08/28/2020)   PAP SMEAR-Modifier  06/11/2022 (Originally 08/28/2020)   TETANUS/TDAP  06/11/2022 (Originally 04/18/2022)   HPV VACCINES  Discontinued   COVID-19 Vaccine  Discontinued    Discussed health benefits of physical activity, and encouraged her to engage in regular exercise appropriate for her age and condition.  Problem List Items Addressed This Visit   None Visit Diagnoses     Annual physical exam    -  Primary   Cervical cancer screening       Need for Tdap vaccination          No follow-ups on file.     Joy Jessup, NP   

## 2021-12-04 ENCOUNTER — Other Ambulatory Visit: Payer: Self-pay

## 2021-12-04 ENCOUNTER — Ambulatory Visit (INDEPENDENT_AMBULATORY_CARE_PROVIDER_SITE_OTHER): Payer: No Typology Code available for payment source | Admitting: Adult Health

## 2021-12-04 ENCOUNTER — Encounter: Payer: Self-pay | Admitting: Adult Health

## 2021-12-04 ENCOUNTER — Ambulatory Visit (INDEPENDENT_AMBULATORY_CARE_PROVIDER_SITE_OTHER): Payer: No Typology Code available for payment source

## 2021-12-04 VITALS — BP 138/88 | HR 93 | Temp 98.2°F | Ht 74.0 in | Wt 242.0 lb

## 2021-12-04 DIAGNOSIS — J069 Acute upper respiratory infection, unspecified: Secondary | ICD-10-CM | POA: Diagnosis not present

## 2021-12-04 DIAGNOSIS — J029 Acute pharyngitis, unspecified: Secondary | ICD-10-CM

## 2021-12-04 DIAGNOSIS — E1165 Type 2 diabetes mellitus with hyperglycemia: Secondary | ICD-10-CM

## 2021-12-04 DIAGNOSIS — R051 Acute cough: Secondary | ICD-10-CM | POA: Diagnosis not present

## 2021-12-04 DIAGNOSIS — R062 Wheezing: Secondary | ICD-10-CM

## 2021-12-04 LAB — COMPREHENSIVE METABOLIC PANEL
ALT: 13 U/L (ref 0–53)
AST: 12 U/L (ref 0–37)
Albumin: 4.2 g/dL (ref 3.5–5.2)
Alkaline Phosphatase: 64 U/L (ref 39–117)
BUN: 15 mg/dL (ref 6–23)
CO2: 22 mEq/L (ref 19–32)
Calcium: 9.1 mg/dL (ref 8.4–10.5)
Chloride: 100 mEq/L (ref 96–112)
Creatinine, Ser: 1.21 mg/dL (ref 0.40–1.50)
GFR: 70.31 mL/min (ref >60.00)
Glucose, Bld: 303 mg/dL — ABNORMAL HIGH (ref 70–99)
Potassium: 4.1 mEq/L (ref 3.5–5.1)
Sodium: 133 mEq/L — ABNORMAL LOW (ref 135–145)
Total Bilirubin: 0.5 mg/dL (ref 0.2–1.2)
Total Protein: 7.4 g/dL (ref 6.0–8.3)

## 2021-12-04 LAB — CBC WITH DIFFERENTIAL/PLATELET
Basophils Absolute: 0.1 10*3/uL (ref 0.0–0.1)
Basophils Relative: 1.2 % (ref 0.0–3.0)
Eosinophils Absolute: 0.2 10*3/uL (ref 0.0–0.7)
Eosinophils Relative: 2.8 % (ref 0.0–5.0)
HCT: 46.7 % (ref 39.0–52.0)
Hemoglobin: 15.5 g/dL (ref 13.0–17.0)
Lymphocytes Relative: 20.2 % (ref 12.0–46.0)
Lymphs Abs: 1.5 10*3/uL (ref 0.7–4.0)
MCHC: 33.1 g/dL (ref 30.0–36.0)
MCV: 89.8 fl (ref 78.0–100.0)
Monocytes Absolute: 0.6 10*3/uL (ref 0.1–1.0)
Monocytes Relative: 8.1 % (ref 3.0–12.0)
Neutro Abs: 5 10*3/uL (ref 1.4–7.7)
Neutrophils Relative %: 67.7 % (ref 43.0–77.0)
Platelets: 182 10*3/uL (ref 150.0–400.0)
RBC: 5.2 Mil/uL (ref 4.22–5.81)
RDW: 12.9 % (ref 11.5–15.5)
WBC: 7.4 10*3/uL (ref 4.0–10.5)

## 2021-12-04 LAB — POCT RAPID STREP A (OFFICE): Rapid Strep A Screen: NEGATIVE

## 2021-12-04 LAB — HEMOGLOBIN A1C: Hgb A1c MFr Bld: 8.6 % — ABNORMAL HIGH (ref 4.6–6.5)

## 2021-12-04 LAB — POC COVID19 BINAXNOW: SARS Coronavirus 2 Ag: NEGATIVE

## 2021-12-04 MED ORDER — PREDNISONE 10 MG (21) PO TBPK: ORAL_TABLET | ORAL | 0 refills | Status: DC

## 2021-12-04 MED ORDER — ALBUTEROL SULFATE HFA 108 (90 BASE) MCG/ACT IN AERS: 2.0000 | INHALATION_SPRAY | Freq: Four times a day (QID) | RESPIRATORY_TRACT | 0 refills | Status: DC | PRN

## 2021-12-04 NOTE — Progress Notes (Signed)
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 Is e taking the Metformin XR 500 mg ( 2 tablets with breakfast) and taking Jardiance 10mg  everyday without missing doses? Would he be willing to have a visit with Catie our Pharmacist ? CBC with elevated glucose.  Sodium slightly low at 133, increase electrolyte solution while sick. Recheck CMP in 2 weeks. Kidney and liver function within normal.  Cbc is within normal limits.   Let me know on medications - thanks.

## 2021-12-04 NOTE — Patient Instructions (Signed)
Upper Respiratory Infection, Adult An upper respiratory infection (URI) affects the nose, throat, and upper airways that lead to the lungs. The most common type of URI is often called the common cold. URIs usually get better on their own, without medical treatment. What are the causes? A URI is caused by a germ (virus). You may catch these germs by: Breathing in droplets from an infected person's cough or sneeze. Touching something that has the germ on it (is contaminated) and then touching your mouth, nose, or eyes. What increases the risk? You are more likely to get a URI if: You are very young or very old. You have close contact with others, such as at work, school, or a health care facility. You smoke. You have long-term (chronic) heart or lung disease. You have a weakened disease-fighting system (immune system). You have nasal allergies or asthma. You have a lot of stress. You have poor nutrition. What are the signs or symptoms? Runny or stuffy (congested) nose. Cough. Sneezing. Sore throat. Headache. Feeling tired (fatigue). Fever. Not wanting to eat as much as usual. Pain in your forehead, behind your eyes, and over your cheekbones (sinus pain). Muscle aches. Redness or irritation of the eyes. Pressure in the ears or face. How is this treated? URIs usually get better on their own within 7-10 days. Medicines cannot cure URIs, but your doctor may recommend certain medicines to help relieve symptoms, such as: Over-the-counter cold medicines. Medicines to reduce coughing (cough suppressants). Coughing is a type of defense against infection that helps to clear the nose, throat, windpipe, and lungs (respiratory system). Take these medicines only as told by your doctor. Medicines to lower your fever. Follow these instructions at home: Activity Rest as needed. If you have a fever, stay home from work or school until your fever is gone, or until your doctor says you may return to  work or school. You should stay home until you cannot spread the infection anymore (you are not contagious). Your doctor may have you wear a face mask so you have less risk of spreading the infection. Relieving symptoms Rinse your mouth often with salt water. To make salt water, dissolve -1 tsp (3-6 g) of salt in 1 cup (237 mL) of warm water. Use a cool-mist humidifier to add moisture to the air. This can help you breathe more easily. Eating and drinking  Drink enough fluid to keep your pee (urine) pale yellow. Eat soups and other clear broths. General instructions  Take over-the-counter and prescription medicines only as told by your doctor. Do not smoke or use any products that contain nicotine or tobacco. If you need help quitting, ask your doctor. Avoid being where people are smoking (avoid secondhand smoke). Stay up to date on all your shots (immunizations), and get the flu shot every year. Keep all follow-up visits. How to prevent the spread of infection to others  Wash your hands with soap and water for at least 20 seconds. If you cannot use soap and water, use hand sanitizer. Avoid touching your mouth, face, eyes, or nose. Cough or sneeze into a tissue or your sleeve or elbow. Do not cough or sneeze into your hand or into the air. Contact a doctor if: You are getting worse, not better. You have any of these: A fever or chills. Brown or red mucus in your nose. Yellow or brown fluid (discharge)coming from your nose. Pain in your face, especially when you bend forward. Swollen neck glands. Pain when you swallow.  White areas in the back of your throat. Get help right away if: You have shortness of breath that gets worse. You have very bad or constant: Headache. Ear pain. Pain in your forehead, behind your eyes, and over your cheekbones (sinus pain). Chest pain. You have long-lasting (chronic) lung disease along with any of these: Making high-pitched whistling sounds when  you breathe, most often when you breathe out (wheezing). Long-lasting cough (more than 14 days). Coughing up blood. A change in your usual mucus. You have a stiff neck. You have changes in your: Vision. Hearing. Thinking. Mood. These symptoms may be an emergency. Get help right away. Call 911. Do not wait to see if the symptoms will go away. Do not drive yourself to the hospital. Summary An upper respiratory infection (URI) is caused by a germ (virus). The most common type of URI is often called the common cold. URIs usually get better within 7-10 days. Take over-the-counter and prescription medicines only as told by your doctor. This information is not intended to replace advice given to you by your health care provider. Make sure you discuss any questions you have with your health care provider. Document Revised: 05/28/2021 Document Reviewed: 05/28/2021 Elsevier Patient Education  [place]. Albuterol Metered Dose Inhaler (MDI) What is this medication? ALBUTEROL (al William Frey) treats lung diseases, such as asthma, where the airways in the lungs narrow, causing breathing problems or wheezing (bronchospasm). It is also used to treat asthma or prevent breathing problems during exercise. This medication works by opening the airways of the lungs, making it easier to breathe. It is often called a rescue- or quick-relief inhaler. This medicine may be used for other purposes; ask your health care provider or pharmacist if you have questions. COMMON BRAND NAME(S): Proair HFA, Proventil, Proventil HFA, Respirol, Ventolin, Ventolin HFA What should I tell my care team before I take this medication? They need to know if you have any of these conditions: Diabetes (high blood sugar) Heart disease High blood pressure Irregular heartbeat or rhythm Pheochromocytoma Seizures Thyroid disease An unusual or allergic reaction to albuterol, other medications, foods, dyes, or  preservatives Pregnant or trying to get pregnant Breast-feeding How should I use this medication? This medication is inhaled through the mouth. Take it as directed on the prescription label. Do not use it more often than directed. This medication comes with INSTRUCTIONS FOR USE. Ask your pharmacist for directions on how to use this medication. Read the information carefully. Talk to your pharmacist or care team if you have questions. Talk to your care team about the use of this medication in children. While it may be given to children for selected conditions, precautions do apply. Overdosage: If you think you have taken too much of this medicine contact a poison control center or emergency room at once. NOTE: This medicine is only for you. Do not share this medicine with others. What if I miss a dose? If you take this medication on a regular basis, take it as soon as you can. If it is almost time for your next dose, take only that dose. Do not take double or extra doses. What may interact with this medication? Caffeine Chloroquine Cisapride Diuretics Medications for colds Medications for depression or for emotional or psychotic conditions Medications for weight loss including some herbal products Methadone Pentamidine Some antibiotics like clarithromycin, erythromycin, levofloxacin, and linezolid Some heart medications Steroid hormones like dexamethasone, cortisone, hydrocortisone Theophylline Thyroid hormones This list may not describe all  possible interactions. Give your health care provider a list of all the medicines, herbs, non-prescription drugs, or dietary supplements you use. Also tell them if you smoke, drink alcohol, or use illegal drugs. Some items may interact with your medicine. What should I watch for while using this medication? Visit your care team for regular checks on your progress. Tell your care team if your symptoms do not start to get better or if they get worse. If  your symptoms get worse or if you are using this medication more than normal, call your care team right away. Do not treat yourself for coughs, colds or allergies without asking your care team for advice. Some nonprescription medications can affect this one. You and your care team should develop an Asthma Action Plan that is just for you. Be sure to know what to do if you are in the yellow (asthma is getting worse) or red (medical alert) zones. Your mouth may get dry. Chewing sugarless gum or sucking hard candy and drinking plenty of water may help. Contact your health care provider if the problem does not go away or is severe. What side effects may I notice from receiving this medication? Side effects that you should report to your care team as soon as possible: Allergic reactions--skin rash, itching, hives, swelling of the face, lips, tongue, or throat Heart rhythm changes--fast or irregular heartbeat, dizziness, feeling faint or lightheaded, chest pain, trouble breathing Increase in blood pressure Muscle pain or cramps Wheezing or trouble breathing that is worse after use Side effects that usually do not require medical attention (report to your care team if they continue or are bothersome): Change in taste Dry mouth Headache Sore throat Tremors or shaking Trouble sleeping This list may not describe all possible side effects. Call your doctor for medical advice about side effects. You may report side effects to FDA at 1-800-FDA-1088. Where should I keep my medication? Keep out of the reach of children and pets. Store at room temperature between 20 and 25 degrees C (68 and 77 degrees F). Keep inhaler away from extreme heat and cold. Get rid of it when the dose counter reads 0 or after the expiration date, whichever is first. To get rid of medications that are no longer needed or have expired: Take the medication to a medication take-back program. Check with your pharmacy or law enforcement  to find a location. If you cannot return the medication, ask your care team how to get rid of this medication safely. NOTE: This sheet is a summary. It may not cover all possible information. If you have questions about this medicine, talk to your doctor, pharmacist, or health care provider.  2022 Elsevier/Gold Standard (2020-10-11 00:00:00) Prednisolone Tablets What is this medication? PREDNISOLONE (pred NISS oh lone) treats many conditions such as asthma, allergic reactions, arthritis, inflammatory bowel diseases, adrenal, and blood or bone marrow disorders. It works by decreasing inflammation, slowing down an overactive immune system, or replacing cortisol normally made in the body. Cortisol is a hormone that plays an important role in how the body responds to stress, illness, and injury. It belongs to a group of medications called steroids. This medicine may be used for other purposes; ask your health care provider or pharmacist if you have questions. COMMON BRAND NAME(S): Millipred, Millipred DP, Millipred DP 12-Day, Millipred DP 6 Day, Prednoral What should I tell my care team before I take this medication? They need to know if you have any of these conditions: Cushing's syndrome  Diabetes Glaucoma Heart problems or disease High blood pressure Infection such as herpes, measles, tuberculosis, or chickenpox Kidney disease Liver disease Mental problems Myasthenia gravis Osteoporosis Seizures Stomach ulcer or intestine disease including colitis and diverticulitis Thyroid problem An unusual or allergic reaction to lactose, prednisolone, other medications, foods, dyes, or preservatives Pregnant or trying to get pregnant Breast-feeding How should I use this medication? Take this medication by mouth with a glass of water. Follow the directions on the prescription label. Take it with food or milk to avoid stomach upset. If you are taking this medication once a day, take it in the morning.  Do not take more medication than you are told to take. Do not suddenly stop taking your medication because you may develop a severe reaction. Your care team will tell you how much medication to take. If your care team wants you to stop the medication, the dose may be slowly lowered over time to avoid any side effects. Talk to your care team about the use of this medication in children. Special care may be needed. Overdosage: If you think you have taken too much of this medicine contact a poison control center or emergency room at once. NOTE: This medicine is only for you. Do not share this medicine with others. What if I miss a dose? If you miss a dose, take it as soon as you can. If it is almost time for your next dose, take only that dose. Do not take double or extra doses. What may interact with this medication? Do not take this medication with any of the following: Metyrapone Mifepristone This medication may also interact with the following: Aminoglutethimide Amphotericin B Aspirin and aspirin-like medications Barbiturates Certain medications for diabetes, like glipizide or glyburide Cholestyramine Cholinesterase inhibitors Cyclosporine Digoxin Diuretics Ephedrine Male hormones, like estrogens and birth control pills Isoniazid Ketoconazole NSAIDS, medications for pain and inflammation, like ibuprofen or naproxen Phenytoin Rifampin Toxoids Vaccines Warfarin This list may not describe all possible interactions. Give your health care provider a list of all the medicines, herbs, non-prescription drugs, or dietary supplements you use. Also tell them if you smoke, drink alcohol, or use illegal drugs. Some items may interact with your medicine. What should I watch for while using this medication? Visit your care team for regular checks on your progress. If you are taking this medication over a prolonged period, carry an identification card with your name and address, the type and dose  of your medication, and your care team's name and address. This medication may increase your risk of getting an infection. Tell your care team if you are around anyone with measles or chickenpox, or if you develop sores or blisters that do not heal properly. If you are going to have surgery, tell your care team that you have taken this medication within the last twelve months. Ask your care team about your diet. You may need to lower the amount of salt you eat. This medication may increase blood sugar. Ask your care team if changes in diet or medications are needed if you have diabetes. What side effects may I notice from receiving this medication? Side effects that you should report to your care team as soon as possible: Allergic reactions--skin rash, itching, hives, swelling of the face, lips, tongue, or throat Cushing syndrome--increased fat around the midsection, upper back, neck, or face, pink or purple stretch marks on the skin, thinning, fragile skin that easily bruises, unexpected hair growth High blood sugar (hyperglycemia)--increased thirst or amount  of urine, unusual weakness or fatigue, blurry vision Increase in blood pressure Infection--fever, chills, cough, sore throat, wounds that don't heal, pain or trouble when passing urine, general feeling of discomfort or being unwell Low adrenal gland function--nausea, vomiting, loss of appetite, unusual weakness or fatigue, dizziness Mood and behavior changes--anxiety, nervousness, confusion, hallucinations, irritability, hostility, thoughts of suicide or self-harm, worsening mood, feelings of depression Stomach bleeding--bloody or black, tar-like stools, vomiting blood or brown material that looks like coffee grounds Swelling of the ankles, hands, or feet Side effects that usually do not require medical attention (report to your care team if they continue or are bothersome): Acne General discomfort and fatigue Headache Increase in  appetite Nausea Trouble sleeping Weight gain This list may not describe all possible side effects. Call your doctor for medical advice about side effects. You may report side effects to FDA at 1-800-FDA-1088. Where should I keep my medication? Keep out of the reach of children. Store at room temperature between 15 and 30 degrees C (59 and 86 degrees F). Keep container tightly closed. Throw away any unused medication after the expiration date. NOTE: This sheet is a summary. It may not cover all possible information. If you have questions about this medicine, talk to your doctor, pharmacist, or health care provider.  2022 Elsevier/Gold Standard (2021-01-24 00:00:00)

## 2021-12-04 NOTE — Progress Notes (Signed)
Acute Office Visit  Subjective:    Patient ID: William Frey, male    DOB: June 18, 1971, 51 y.o.   MRN: [id number]  Chief Complaint  Patient presents with   Sore Throat   Headache   Nasal Congestion    HPI Patient is in today for sneezing, and congestion, runny nose.  Has very sore throat. Using cough drops.  Questionable if fever last night. Did not take temperature.  Wheezing last night.   Onset was 12/03/21  He worked yesterday.  Wife has covid on christmas day.  Friend two weeks ago had influenza.  He denies any other recent illness.   Left elbow cellulitis has resolved, he has not been to orthopedics. Erythema resolved still has slight effusion. No erythema, did not go to [facility]. Still has mild pain in left elbow.   Patient  denies any fever, body aches,chills, rash, chest pain, shortness of breath, nausea, vomiting, or diarrhea.  Denies dizziness, lightheadedness, pre syncopal or syncopal episodes.    Past Medical History:  Diagnosis Date   Allergy    Anxiety    Arthritis    Asthma    Chicken pox    Complication of anesthesia    difficult to wake up   COVID-19    11/26/20 sp MAB infusion   Diabetes mellitus without complication (HCC)    Family history of adverse reaction to anesthesia    father had low tolerance for anesthesia   GERD (gastroesophageal reflux disease)    History of kidney stones    Hyperlipidemia    Hypertension    Low testosterone    Migraines     Past Surgical History:  Procedure Laterality Date   CYSTOSCOPY W/ RETROGRADES Right 10/28/2018   Procedure: CYSTOSCOPY WITH RETROGRADE PYELOGRAM;  Surgeon: Billey Co, MD;  Location: [facility] ORS;  Service: Urology;  Laterality: Right;   CYSTOSCOPY/URETEROSCOPY/HOLMIUM LASER/STENT PLACEMENT Right 10/28/2018   Procedure: CYSTOSCOPY/URETEROSCOPY/HOLMIUM LASER/STENT PLACEMENT;  Surgeon: Billey Co, MD;  Location: [facility] ORS;  Service: Urology;  Laterality: Right;   OSTECTOMY METATARSAL  Right 2003    Family History  Problem Relation Age of Onset   Arthritis Mother    Cancer Mother        breast and bone   Heart Problems Mother    Arthritis Father    Lymphoma Father    Heart disease Father    Stroke Father    Hypertension Father    Clotting disorder Father    Cancer Brother        lung   Heart disease Brother        atriall fibrillation   Diabetes Brother    Heart disease Brother        atrial fibrillation   Diabetes Maternal Aunt    Diabetes Maternal Aunt     Social History   Socioeconomic History   Marital status: Married    Spouse name: Not on file   Number of children: Not on file   Years of education: Not on file   Highest education level: Not on file  Occupational History   Not on file  Tobacco Use   Smoking status: Former    Packs/day: 2.00    Years: 25.00    Pack years: 50.00    Types: Cigarettes    Quit date: 09/17/2013    Years since quitting: 8.2   Smokeless tobacco: Never  Vaping Use   Vaping Use: Never used  Substance and Sexual Activity   Alcohol use:  No    Alcohol/week: 0.0 standard drinks    Comment: former   Drug use: No   Sexual activity: Yes  Other Topics Concern   Not on file  Social History Narrative   Married          Social Determinants of Health   Financial Resource Strain: Not on file  Food Insecurity: Not on file  Transportation Needs: Not on file  Physical Activity: Not on file  Stress: Not on file  Social Connections: Not on file  Intimate Partner Violence: Not on file    Outpatient Medications Prior to Visit  Medication Sig Dispense Refill   ANDROGEL PUMP 20.25 MG/ACT (1.62%) GEL Apply 4 Doses topically daily. Use 4 pumps (2 on each shoulder) daily. 88 g 5   clarithromycin (BIAXIN) 500 MG tablet Take 1 tablet (500 mg total) by mouth 2 (two) times daily. 20 tablet 0   diazepam (VALIUM) 5 MG tablet Take 1 tablet (5 mg total) by mouth daily as needed for anxiety. 10 tablet 0   doxycycline  (VIBRAMYCIN) 100 MG capsule Take 1 capsule (100 mg total) by mouth 2 (two) times daily. 20 capsule 0   empagliflozin (JARDIANCE) 10 MG TABS tablet Take 1 tablet (10 mg total) by mouth daily before breakfast. 90 tablet 1   glucose blood test strip 1 each by Other route as needed for other (true matrix glucose test strips. E11.9). Use as instructed     ketoconazole (NIZORAL) 2 % cream Apply 1 application topically daily as needed for irritation (Rash). 15 g 0   Lancets (ONETOUCH ULTRASOFT) lancets Use as instructed 100 each 12   metFORMIN (GLUCOPHAGE-XR) 500 MG 24 hr tablet TAKE TWO TABLETS ONCE A DAY WITH BREAKFAST 180 tablet 1   No facility-administered medications prior to visit.    Allergies  Allergen Reactions   Penicillins Hives    Childhood allergy Has patient had a PCN reaction causing immediate rash, facial/tongue/throat swelling, SOB or lightheadedness with hypotension: No Has patient had a PCN reaction causing severe rash involving mucus membranes or skin necrosis: No Has patient had a PCN reaction that required hospitalization: No Has patient had a PCN reaction occurring within the last 10 years: No If all of the above answers are "NO", then may proceed with Cephalosporin use.     Review of Systems  Constitutional:  Positive for fatigue. Negative for activity change, appetite change, chills, diaphoresis, fever and unexpected weight change.  HENT:  Positive for congestion, postnasal drip and rhinorrhea. Negative for dental problem, drooling, ear discharge, ear pain, facial swelling, hearing loss, mouth sores, sinus pressure, sinus pain, sore throat, trouble swallowing and voice change.   Respiratory:  Positive for cough and wheezing. Negative for apnea, choking, chest tightness, shortness of breath and stridor.   Cardiovascular: Negative.   Gastrointestinal: Negative.   Genitourinary: Negative.   Musculoskeletal: Negative.   Neurological: Negative.   Psychiatric/Behavioral:  Negative.        Objective:    Physical Exam Vitals reviewed.  Constitutional:      Appearance: He is well-developed.  HENT:     Head: Normocephalic and atraumatic.     Right Ear: Tympanic membrane, ear canal and external ear normal. No drainage or tenderness.     Left Ear: Tympanic membrane, ear canal and external ear normal. No drainage or tenderness.     Nose: Nose normal. No mucosal edema.     Right Sinus: No maxillary sinus tenderness or frontal sinus tenderness.  Left Sinus: No maxillary sinus tenderness or frontal sinus tenderness.     Mouth/Throat:     Pharynx: No oropharyngeal exudate.  Eyes:     General:        Right eye: No discharge.        Left eye: No discharge.     Conjunctiva/sclera: Conjunctivae normal.     Pupils: Pupils are equal, round, and reactive to light.  Neck:     Thyroid: No thyroid mass or thyromegaly.     Vascular: No carotid bruit.     Trachea: Trachea and phonation normal. No tracheal deviation.  Cardiovascular:     Rate and Rhythm: Normal rate and regular rhythm.     Heart sounds: Normal heart sounds. No murmur heard.   No friction rub. No gallop.  Pulmonary:     Effort: Pulmonary effort is normal. No tachypnea, accessory muscle usage or respiratory distress.     Breath sounds: Normal breath sounds. No stridor. No decreased breath sounds, wheezing, rhonchi or rales.  Musculoskeletal:     Left elbow: Effusion (scant much less than last visit and all cellulitus of skin resolved.) present. No deformity or lacerations. Normal range of motion. No tenderness.     Cervical back: Full passive range of motion without pain, normal range of motion and neck supple.  Lymphadenopathy:     Cervical: No cervical adenopathy.  Skin:    General: Skin is warm and dry.     Findings: No erythema or rash.  Neurological:     Mental Status: He is alert and oriented to person, place, and time.    BP 138/88    Pulse 93    Temp 98.2 F (36.8 C) (Oral)    Ht 6'  2" (1.88 m)    Wt 242 lb (109.8 kg)    SpO2 98%    BMI 31.07 kg/m  Wt Readings from Last 3 Encounters:  12/04/21 242 lb (109.8 kg)  11/12/21 242 lb 6.4 oz (110 kg)  05/26/21 250 lb 12.8 oz (113.8 kg)    Health Maintenance Due  Topic Date Due   COLONOSCOPY (Pts 45-65yrs Insurance coverage will need to be confirmed)  Never done   INFLUENZA VACCINE  Never done   URINE MICROALBUMIN  06/28/2021   FOOT EXAM  10/28/2021    There are no preventive care reminders to display for this patient.   Lab Results  Component Value Date   TSH 1.71 09/21/2019   Lab Results  Component Value Date   WBC 7.4 12/04/2021   HGB 15.5 12/04/2021   HCT 46.7 12/04/2021   MCV 89.8 12/04/2021   PLT 182.0 12/04/2021   Lab Results  Component Value Date   NA 133 (L) 12/04/2021   K 4.1 12/04/2021   CO2 22 12/04/2021   GLUCOSE 303 (H) 12/04/2021   BUN 15 12/04/2021   CREATININE 1.21 12/04/2021   BILITOT 0.5 12/04/2021   ALKPHOS 64 12/04/2021   AST 12 12/04/2021   ALT 13 12/04/2021   PROT 7.4 12/04/2021   ALBUMIN 4.2 12/04/2021   CALCIUM 9.1 12/04/2021   ANIONGAP 9 11/01/2018   GFR 70.31 12/04/2021   Lab Results  Component Value Date   CHOL 183 06/28/2020   Lab Results  Component Value Date   HDL 31.80 (L) 06/28/2020   Lab Results  Component Value Date   LDLCALC 47 01/02/2014   Lab Results  Component Value Date   TRIG 388.0 (H) 06/28/2020   Lab Results  Component  Value Date   CHOLHDL 6 06/28/2020   Lab Results  Component Value Date   HGBA1C 8.6 (H) 12/04/2021       Assessment & Plan:   Problem List Items Addressed This Visit       Endocrine   Type 2 diabetes mellitus with hyperglycemia, without long-term current use of insulin (HCC) - Primary   Relevant Orders   Hemoglobin A1c (Completed)   AMB Referral to [facility]   Other Visit Diagnoses     Viral upper respiratory tract infection       Relevant Medications   predniSONE (STERAPRED UNI-PAK 21 TAB)  10 MG (21) TBPK tablet   Sore throat       Relevant Orders   COVID-19, Flu A+B and RSV   POCT rapid strep A (Completed)   POC COVID-19 (Completed)   DG Chest 2 View   CBC with Differential/Platelet (Completed)   Acute cough       Relevant Orders   DG Chest 2 View   CBC with Differential/Platelet (Completed)   Comprehensive metabolic panel (Completed)   Wheezing       Relevant Medications   albuterol (VENTOLIN HFA) 108 (90 Base) MCG/ACT inhaler       Orders Placed This Encounter  Procedures   COVID-19, Flu A+B and RSV    Order Specific Question:   Previously tested for COVID-19    Answer:   Yes    Order Specific Question:   Resident in a congregate (group) care setting    Answer:   No    Order Specific Question:   Is the patient student?    Answer:   No    Order Specific Question:   Employed in healthcare setting    Answer:   No    Order Specific Question:   Has patient completed COVID vaccination(s) (2 doses of Pfizer/Moderna 1 dose of [place])    Answer:   No   DG Chest 2 View    Order Specific Question:   Reason for Exam (SYMPTOM  OR DIAGNOSIS REQUIRED)    Answer:   cough, shortness of breath    Order Specific Question:   Preferred imaging location?    Answer:   [facility]   CBC with Differential/Platelet   Hemoglobin A1c   Comprehensive metabolic panel   AMB Referral to [facility]    Referral Priority:   Routine    Referral Type:   Consultation    Referral Reason:   Care Coordination    Number of Visits Requested:   1   POCT rapid strep A   POC COVID-19    Order Specific Question:   Previously tested for COVID-19    Answer:   Yes    Order Specific Question:   Resident in a congregate (group) care setting    Answer:   No    Order Specific Question:   Employed in healthcare setting    Answer:   No      Meds ordered this encounter  Medications   predniSONE (STERAPRED UNI-PAK 21 TAB) 10 MG (21) TBPK tablet    Sig:  PO: Take 6 tablets on day 1:Take 5 tablets day 2:Take 4 tablets day 3: Take 3 tablets day 4:Take 2 tablets day 5 Take 1 tablet day 6    Dispense:  21 tablet    Refill:  0   albuterol (VENTOLIN HFA) 108 (90 Base) MCG/ACT inhaler    Sig: Inhale  2 puffs into the lungs every 6 (six) hours as needed for wheezing or shortness of breath.    Dispense:  8 g    Refill:  0  Monitor glucose close with steroid. A1C is nor controlled. Needs follow up. Recheck A1C today.   Return in about 1 week (around 12/11/2021), or if symptoms worsen or fail to improve, for at any time for any worsening symptoms, Go to Emergency room/ urgent care if worse.  Red Flags discussed. The patient was given clear instructions to go to ER or return to medical center if any red flags develop, symptoms do not improve, worsen or new problems develop. They verbalized understanding.   Marcille Buffy, FNP

## 2021-12-04 NOTE — Progress Notes (Signed)
Patient aware in office, negative covid and strep - send off test for flu, covid and rsv sent.

## 2021-12-05 ENCOUNTER — Telehealth: Payer: Self-pay

## 2021-12-05 NOTE — Progress Notes (Signed)
Your chest x ray is within normal limits.

## 2021-12-05 NOTE — Telephone Encounter (Signed)
Called pt to inform that CXR came back normal. Unable to reach nor lvm.

## 2021-12-06 LAB — COVID-19, FLU A+B AND RSV
Influenza A, NAA: NOT DETECTED
Influenza B, NAA: NOT DETECTED
RSV, NAA: NOT DETECTED
SARS-CoV-2, NAA: NOT DETECTED

## 2021-12-08 ENCOUNTER — Telehealth: Payer: Self-pay

## 2021-12-08 NOTE — Chronic Care Management (AMB) (Signed)
°  Care Management   Outreach Note  12/08/2021 Name: William Frey MRN: [id number] DOB: [birth year]  Referred by: Leone Haven, MD Reason for referral : Care Coordination (Outreach to schedule referral for pharm d )   An unsuccessful telephone outreach was attempted today. The patient was referred to the case management team for assistance with care management and care coordination.   Follow Up Plan:  The care management team will reach out to the patient again over the next 5 days.  If patient returns call to provider office, please advise to call [place] * at [phone]*  Noreene Larsson, [place], [place] Management  [place], [place] [address] Direct Dial: [phone] [email or web address]

## 2021-12-11 NOTE — Chronic Care Management (AMB) (Signed)
°  Care Management   Outreach Note  12/11/2021 Name: William Frey MRN: [id number] DOB: September 25, 1971  Referred by: Leone Haven, MD Reason for referral : Care Coordination (Outreach to schedule referral for pharm d )   An unsuccessful telephone outreach was attempted today. The patient was referred to the case management team for assistance with care management and care coordination.   Follow Up Plan:  The care management team will reach out to the patient again over the next 7 days.  If patient returns call to provider office, please advise to call [place] * at [phone]*  Noreene Larsson, [place], [place] Management  [place], [place] [address] Direct Dial: [phone] [email or web address]

## 2021-12-16 ENCOUNTER — Encounter: Payer: Self-pay | Admitting: Emergency Medicine

## 2021-12-16 ENCOUNTER — Other Ambulatory Visit: Payer: Self-pay

## 2021-12-16 ENCOUNTER — Ambulatory Visit
Admission: EM | Admit: 2021-12-16 | Discharge: 2021-12-16 | Disposition: A | Discharge status: Home / Self Care | Payer: No Typology Code available for payment source | Attending: Emergency Medicine | Admitting: Emergency Medicine

## 2021-12-16 DIAGNOSIS — L03031 Cellulitis of right toe: Secondary | ICD-10-CM

## 2021-12-16 MED ORDER — DOXYCYCLINE HYCLATE 100 MG PO CAPS: 100.0000 mg | ORAL_CAPSULE | Freq: Two times a day (BID) | ORAL | 0 refills | Status: AC

## 2021-12-16 NOTE — ED Provider Notes (Signed)
William Frey    CSN: [id number] Arrival date & time: 12/16/21  1516      History   Chief Complaint Chief Complaint  Patient presents with   Wound Check    HPI William Frey is a 51 y.o. male.  Patient presents with 2-day history of right great toe redness and pain.  He has an open wound at the site of his toenail which he attributes to ingrown nail.  He reports scant bloody drainage at times.  He has occasional numbness and tingling in his feet due to neuropathy.  He denies fever, chills, or other symptoms.  His medical history also includes diabetes, neuropathy, hypertension, and asthma.  The history is provided by the patient and medical records.   Past Medical History:  Diagnosis Date   Allergy    Anxiety    Arthritis    Asthma    Chicken pox    Complication of anesthesia    difficult to wake up   COVID-19    11/26/20 sp MAB infusion   Diabetes mellitus without complication ([place])    Family history of adverse reaction to anesthesia    father had low tolerance for anesthesia   GERD (gastroesophageal reflux disease)    History of kidney stones    Hyperlipidemia    Hypertension    Low testosterone    Migraines     Patient Active Problem List   Diagnosis Date Noted   History of COVID-19 02/10/2021   Insomnia 12/27/2020   Panic attack 12/27/2020   Left flank pain 10/28/2020   Vitamin D deficiency 10/28/2020   Detachment of glenoid labrum 07/05/2020   Lumbar sprain 07/05/2020   Neck sprain 07/05/2020   Neuropathy 06/28/2020   Hypogonadism in male 06/28/2020   Nephrolithiasis 11/12/2018   Proteinuria 11/12/2018   Screen for STD (sexually transmitted disease) 05/04/2018   Anxiety and depression 11/30/2017   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) 09/23/2015   Low testosterone 09/23/2015   Preventative health care 09/18/2015   Obesity (BMI 30-39.9) 09/18/2015   Elevated blood-pressure reading without diagnosis of hypertension 09/18/2015    Past Surgical History:  Procedure Laterality Date   CYSTOSCOPY W/ RETROGRADES Right 10/28/2018   Procedure: CYSTOSCOPY WITH RETROGRADE PYELOGRAM;  Surgeon: Billey Co, MD;  Location: [facility] ORS;  Service: Urology;  Laterality: Right;   CYSTOSCOPY/URETEROSCOPY/HOLMIUM LASER/STENT PLACEMENT Right 10/28/2018   Procedure: CYSTOSCOPY/URETEROSCOPY/HOLMIUM LASER/STENT PLACEMENT;  Surgeon: Billey Co, MD;  Location: [facility] ORS;  Service: Urology;  Laterality: Right;   OSTECTOMY METATARSAL Right 2003       Home Medications    Prior to Admission medications   Medication Sig Start Date End Date Taking? Authorizing Provider  doxycycline (VIBRAMYCIN) 100 MG capsule Take 1 capsule (100 mg total) by mouth 2 (two) times daily for 7 days. 12/16/21 12/23/21 Yes Sharion Balloon, NP  albuterol (VENTOLIN HFA) 108 (90 Base) MCG/ACT inhaler Inhale 2 puffs into the lungs every 6 (six) hours as needed for wheezing or shortness of breath. 12/04/21   Flinchum, Kelby Aline, FNP  ANDROGEL PUMP 20.25 MG/ACT (1.62%) GEL Apply 4 Doses topically daily. Use 4 pumps (2 on each shoulder) daily.  04/30/21   Zara Council A, PA-C  clarithromycin (BIAXIN) 500 MG tablet Take 1 tablet (500 mg total) by mouth 2 (two) times daily. 11/12/21   Flinchum, Kelby Aline, FNP  diazepam (VALIUM) 5 MG tablet Take 1 tablet (5 mg total) by mouth daily as needed for anxiety. 02/10/21   Leone Haven, MD  empagliflozin (JARDIANCE) 10 MG TABS tablet Take 1 tablet (10 mg total) by mouth daily before breakfast. 05/26/21   Leone Haven, MD  glucose blood test strip 1 each by Other route as  needed for other (true matrix glucose test strips. E11.9). Use as instructed    [provider]  ketoconazole (NIZORAL) 2 % cream Apply 1 application topically daily as needed for irritation (Rash). 05/26/21   Leone Haven, MD  Lancets [facility] ULTRASOFT) lancets Use as instructed 09/23/15   Coral Spikes, DO  metFORMIN (GLUCOPHAGE-XR) 500 MG 24 hr tablet TAKE TWO TABLETS ONCE A DAY WITH BREAKFAST 05/26/21   Leone Haven, MD  predniSONE (STERAPRED UNI-PAK 21 TAB) 10 MG (21) TBPK tablet PO: Take 6 tablets on day 1:Take 5 tablets day 2:Take 4 tablets day 3: Take 3 tablets day 4:Take 2 tablets day 5 Take 1 tablet day 6 12/04/21   Flinchum, Kelby Aline, FNP    Family History Family History  Problem Relation Age of Onset   Arthritis Mother    Cancer Mother        breast and bone   Heart Problems Mother    Arthritis Father    Lymphoma Father    Heart disease Father    Stroke Father    Hypertension Father    Clotting disorder Father    Cancer Brother        lung   Heart disease Brother        atriall fibrillation   Diabetes Brother    Heart disease Brother        atrial fibrillation   Diabetes Maternal Aunt    Diabetes Maternal Aunt     Social History Social History   Tobacco Use   Smoking status: Former    Packs/day: 2.00    Years: 25.00    Pack years: 50.00    Types: Cigarettes    Quit date: 09/17/2013    Years since quitting: 8.2   Smokeless tobacco: Never  Vaping Use   Vaping Use: Never used  Substance Use Topics   Alcohol use: No    Alcohol/week: 0.0 standard drinks    Comment: former   Drug use: No     Allergies   Penicillins   Review of Systems Review of Systems  Constitutional:  Negative for chills and fever.  Musculoskeletal:  Negative for gait problem and joint swelling.  Skin:  Positive for color change and wound.  All other systems reviewed and are negative.   Physical Exam Triage Vital Signs ED Triage Vitals  Enc Vitals Group      BP 12/16/21 1602 117/78     Pulse Rate 12/16/21 1602 (!) 108     Resp 12/16/21 1602 18     Temp 12/16/21 1602 98.1 F (36.7 C)     Temp src --      SpO2 12/16/21 1602 96 %     Weight --      Height --      Head Circumference --      Peak Flow --      Pain Score 12/16/21 1612 8  Pain Loc --      Pain Edu? --      Excl. in [place]? --    No data found.  Updated Vital Signs BP 117/78    Pulse (!) 108    Temp 98.1 F (36.7 C)    Resp 18    SpO2 96%   Visual Acuity Right Eye Distance:   Left Eye Distance:   Bilateral Distance:    Right Eye Near:   Left Eye Near:    Bilateral Near:     Physical Exam Vitals and nursing note reviewed.  Constitutional:      General: He is not in acute distress.    Appearance: He is well-developed. He is not ill-appearing.  Cardiovascular:     Rate and Rhythm: Normal rate and regular rhythm.     Heart sounds: Normal heart sounds.  Pulmonary:     Effort: Pulmonary effort is normal. No respiratory distress.     Breath sounds: Normal breath sounds.  Musculoskeletal:        General: Tenderness present. No swelling or deformity. Normal range of motion.     Cervical back: Neck supple.  Skin:    General: Skin is warm and dry.     Capillary Refill: Capillary refill takes less than 2 seconds.     Findings: Erythema and lesion present.  Neurological:     General: No focal deficit present.     Mental Status: He is alert and oriented to person, place, and time.     Sensory: No sensory deficit.     Motor: No weakness.     Gait: Gait normal.  Psychiatric:        Mood and Affect: Mood normal.        Behavior: Behavior normal.      UC Treatments / Results  Labs (all labs ordered are listed, but only abnormal results are displayed) Labs Reviewed - No data to display  EKG   Radiology No results found.  Procedures Procedures (including critical care time)  Medications Ordered in UC Medications - No data to display  Initial  Impression / Assessment and Plan / UC Course  I have reviewed the triage vital signs and the nursing notes.  Pertinent labs & imaging results that were available during my care of the patient were reviewed by me and considered in my medical decision making (see chart for details).   Cellulitis of right great toe.  Patient was recently treated with doxycycline. He states he cannot take sulfa medications because they do not work for him.  Treating today with doxycycline and instructed patient to be seen by a podiatrist or his PCP in 2-3 days for a recheck.  He agrees to plan of care.     Final Clinical Impressions(s) / UC Diagnoses   Final diagnoses:  Cellulitis of great toe, right     Discharge Instructions      Take the doxycycline as directed.  Follow up with your primary care provider or a podiatrist in 2-3 days for a recheck.         ED Prescriptions     Medication Sig Dispense Auth. Provider   doxycycline (VIBRAMYCIN) 100 MG capsule Take 1 capsule (100 mg total) by mouth 2 (two) times daily for 7 days. 14 capsule Sharion Balloon, NP      PDMP not reviewed this encounter.   Sharion Balloon, NP 12/16/21 [phone]

## 2021-12-16 NOTE — ED Triage Notes (Signed)
Pt here with right great toe wound and pain x 2 days. Pt is a diabetic. Pt expresses that sulfa based abx do not work for him.

## 2021-12-16 NOTE — Discharge Instructions (Addendum)
Take the doxycycline as directed.  Follow up with your primary care provider or a podiatrist in 2-3 days for a recheck.

## 2021-12-19 NOTE — Chronic Care Management (AMB) (Signed)
°  Care Management   Outreach Note  12/19/2021 Name: William Frey MRN: [id number] DOB: 1971-06-17  Referred by: Leone Haven, MD Reason for referral : Care Coordination (Outreach to schedule referral for pharm d )   Third unsuccessful telephone outreach was attempted today. The patient was referred to the case management team for assistance with care management and care coordination. The patient's primary care provider has been notified of our unsuccessful attempts to make or maintain contact with the patient. The care management team is pleased to engage with this patient at any time in the future should he/she be interested in assistance from the care management team.   Follow Up Plan:  We have been unable to make contact with the patient for follow up. The care management team is available to follow up with the patient after provider conversation with the patient regarding recommendation for care management engagement and subsequent re-referral to the care management team.   Noreene Larsson, [place], [place], [place] [address] Direct Dial: [phone] [email or web address]

## 2022-01-05 ENCOUNTER — Ambulatory Visit: Payer: 59 | Admitting: Family Medicine

## 2022-01-10 ENCOUNTER — Telehealth: Payer: Self-pay | Admitting: Family Medicine

## 2022-01-10 DIAGNOSIS — E119 Type 2 diabetes mellitus without complications: Secondary | ICD-10-CM

## 2022-01-10 DIAGNOSIS — E1165 Type 2 diabetes mellitus with hyperglycemia: Secondary | ICD-10-CM

## 2022-01-12 NOTE — Telephone Encounter (Signed)
Pt need refill on metformin and jardiance sent to [facility] on [address] ?

## 2022-01-13 ENCOUNTER — Other Ambulatory Visit: Payer: Self-pay

## 2022-01-13 DIAGNOSIS — E119 Type 2 diabetes mellitus without complications: Secondary | ICD-10-CM

## 2022-01-13 NOTE — Telephone Encounter (Signed)
Medication sent to the pharmacy per patient request.  Tarique Loveall,cma  

## 2022-03-01 NOTE — Progress Notes (Signed)
03/05/22 ?1:52 PM  ? ?[name] ?1971/08/06 ?[id number] ? ?Referring provider:  ?Leone Haven, MD ?[address] ?[address] ?[place],  [place] [address] ? ?Chief Complaint  ?Patient presents with  ? Follow-up  ?  Knot on penis  ? ? ?Urological history  ? ?1. Nephrolithiasis ?-URS for right 1.8 cm stone 2019  ?  ?2. Testosterone deficiency ?-contributing factors of age, HTN and DM ?-testosterone level pending ?-H & H pending ?-manage with testosterone gel 1.62%, 4 pumps daily ?  ?3. ED ?-contributing factors of age, DM, testosterone deficiency, HTN, anxiety and depression ?- SHIM 25 ? ?HPI: ?William Frey is a 51 y.o.male who presents today for further evaluation of enlarged vein with knot on penis.  ? ?He reports that for over a year he has a blood vessel in his penis that when his penis is erect it protrudes through the skin. It is not painful and it does not effect his erections.  A few weeks ago the vein got larger and he felt a knot when he gains an erection this is bothersome when he tried to have sexual intercourse or masturbates. He denies any curvature. He reports a year ago he had sexual intercourse and he felt like his penis bent the wrong direction.  ? ?Nocturia x 1.  Patient denies any modifying or aggravating factors.  Patient denies any gross hematuria, dysuria or suprapubic/flank pain. Patient denies any fevers, chills, nausea or vomiting.  ? ? ? IPSS   ? ? [place] Name 03/02/22 1000  ?  ?  ?  ? International Prostate Symptom Score  ? How often have you had the sensation of not emptying your bladder? Not at All    ? How often have you had to urinate less than every two hours? Not at All    ? How often have you found you stopped and started again several times when you urinated? Not at All    ? How often have you found it difficult to postpone urination? Not at All    ? How often have you had a weak urinary stream? Not at All    ? How often have you had to strain to start urination? Not at  All    ? How many times did you typically get up at night to urinate? 1 Time    ? Total IPSS Score 1    ?  ? Quality of Life due to urinary symptoms  ? If you were to spend the rest of your life with your urinary condition just the way it is now how would you feel about that? Mostly Satisfied    ? ?  ?  ? ?  ? ? ?Score:  ?1-7 Mild ?8-19 Moderate ?20-35 Severe ? ? ?Patient still having spontaneous erections.  He denies any pain or curvature with erections.   ? ? SHIM   ? ? [place] Name 03/02/22 1044  ?  ?  ?  ? SHIM: Over the last 6 months:  ? How do you rate your confidence that you could get and keep an erection? Very High    ? When you had erections with sexual stimulation, how often were your erections hard enough for penetration (entering your partner)? Almost Always or Always    ? During sexual intercourse, how often were you able to maintain your erection after you had penetrated (entered) your partner? Almost Always or Always    ? During sexual intercourse, how difficult was  it to maintain your erection to completion of intercourse? Not Difficult    ? When you attempted sexual intercourse, how often was it satisfactory for you? Almost Always or Always    ?  ? SHIM Total Score  ? SHIM 25    ? ?  ?  ? ?  ? ? ? ? ?PMH: ?Past Medical History:  ?Diagnosis Date  ? Allergy   ? Anxiety   ? Arthritis   ? Asthma   ? Chicken pox   ? Complication of anesthesia   ? difficult to wake up  ? COVID-19   ? 11/26/20 sp MAB infusion  ? Diabetes mellitus without complication ([place])   ? Family history of adverse reaction to anesthesia   ? father had low tolerance for anesthesia  ? GERD (gastroesophageal reflux disease)   ? History of kidney stones   ? Hyperlipidemia   ? Hypertension   ? Low testosterone   ? Migraines   ? ? ?Surgical History: ?Past Surgical History:  ?Procedure Laterality Date  ? CYSTOSCOPY W/ RETROGRADES Right 10/28/2018  ? Procedure: CYSTOSCOPY WITH RETROGRADE PYELOGRAM;  Surgeon: Billey Co, MD;  Location: [facility]  ORS;  Service: Urology;  Laterality: Right;  ? CYSTOSCOPY/URETEROSCOPY/HOLMIUM LASER/STENT PLACEMENT Right 10/28/2018  ? Procedure: CYSTOSCOPY/URETEROSCOPY/HOLMIUM LASER/STENT PLACEMENT;  Surgeon: Billey Co, MD;  Location: [facility] ORS;  Service: Urology;  Laterality: Right;  ? OSTECTOMY METATARSAL Right 2003  ? ? ?Home Medications:  ?Allergies as of 03/02/2022   ? ?   Reactions  ? Penicillins Hives  ? Childhood allergy ?Has patient had a PCN reaction causing immediate rash, facial/tongue/throat swelling, SOB or lightheadedness with hypotension: No ?Has patient had a PCN reaction causing severe rash involving mucus membranes or skin necrosis: No ?Has patient had a PCN reaction that required hospitalization: No ?Has patient had a PCN reaction occurring within the last 10 years: No ?If all of the above answers are "NO", then may proceed with Cephalosporin use.  ? ?  ? ?  ?Medication List  ?  ? ?  ? Accurate as of March 02, 2022 11:59 PM. If you have any questions, ask your nurse or doctor.  ?  ?  ? ?  ? ?STOP taking these medications   ? ?clarithromycin 500 MG tablet ?Commonly known as: BIAXIN ?Stopped by: Zara Council, PA-C ?  ? ?  ? ?TAKE these medications   ? ?albuterol 108 (90 Base) MCG/ACT inhaler ?Commonly known as: VENTOLIN HFA ?Inhale 2 puffs into the lungs every 6 (six) hours as needed for wheezing or shortness of breath. ?  ?AndroGel Pump 20.25 MG/ACT (1.62%) Gel ?Generic drug: Testosterone ?Apply 4 Doses topically daily. Use 4 pumps (2 on each shoulder) daily. ?  ?diazepam 5 MG tablet ?Commonly known as: VALIUM ?Take 1 tablet (5 mg total) by mouth daily as needed for anxiety. ?  ?glucose blood test strip ?1 each by Other route as needed for other (true matrix glucose test strips. E11.9). Use as instructed ?  ?Jardiance 10 MG Tabs tablet ?Generic drug: empagliflozin ?TAKE 1 TABLET BY MOUTH ONCE DAILY BEFORE BREAKFAST ?  ?ketoconazole 2 % cream ?Commonly known as: NIZORAL ?Apply 1 application topically  daily as needed for irritation (Rash). ?  ?metFORMIN 500 MG 24 hr tablet ?Commonly known as: GLUCOPHAGE-XR ?TAKE 2 TABLETS BY MOUTH ONCE DAILY WITH BREAKFAST ?  ?onetouch ultrasoft lancets ?Use as instructed ?  ? ?  ? ? ?Allergies:  ?Allergies  ?Allergen Reactions  ? Penicillins Hives  ?  Childhood allergy ?Has patient had a PCN reaction causing immediate rash, facial/tongue/throat swelling, SOB or lightheadedness with hypotension: No ?Has patient had a PCN reaction causing severe rash involving mucus membranes or skin necrosis: No ?Has patient had a PCN reaction that required hospitalization: No ?Has patient had a PCN reaction occurring within the last 10 years: No ?If all of the above answers are "NO", then may proceed with Cephalosporin use. ?  ? ? ?Family History: ?Family History  ?Problem Relation Age of Onset  ? Arthritis Mother   ? Cancer Mother   ?     breast and bone  ? Heart Problems Mother   ? Arthritis Father   ? Lymphoma Father   ? Heart disease Father   ? Stroke Father   ? Hypertension Father   ? Clotting disorder Father   ? Cancer Brother   ?     lung  ? Heart disease Brother   ?     atriall fibrillation  ? Diabetes Brother   ? Heart disease Brother   ?     atrial fibrillation  ? Diabetes Maternal Aunt   ? Diabetes Maternal Aunt   ? ? ?Social History:  reports that he quit smoking about 8 years ago. His smoking use included cigarettes. He has a 50.00 pack-year smoking history. He has been exposed to tobacco smoke. He has never used smokeless tobacco. He reports that he does not drink alcohol and does not use drugs. ? ? ?Physical Exam: ?BP 136/79   Pulse 74   Ht '6\' 2"'$  (1.88 m)   Wt 248 lb (112.5 kg)   BMI 31.84 kg/m?   ?Constitutional:  Alert and oriented, No acute distress. ?HEENT: [place] AT, moist mucus membranes.  Trachea midline, no masses. ?Cardiovascular: No clubbing, cyanosis, or edema. ?Respiratory: Normal respiratory effort, no increased work of breathing. ?GU: No CVA tenderness.  No bladder  fullness or masses.  Patient with circumcised phallus. Foreskin easily retracted  Urethral meatus is patent.  No penile discharge. No penile lesions or rashes. Scrotum without lesions, cysts, rashes and/or e

## 2022-03-02 ENCOUNTER — Other Ambulatory Visit
Admission: RE | Admit: 2022-03-02 | Discharge: 2022-03-02 | Disposition: A | Discharge status: Home / Self Care | Payer: 59 | Attending: Urology | Admitting: Urology

## 2022-03-02 ENCOUNTER — Encounter: Payer: Self-pay | Admitting: Urology

## 2022-03-02 ENCOUNTER — Ambulatory Visit: Payer: 59 | Admitting: Urology

## 2022-03-02 VITALS — BP 136/79 | HR 74 | Ht 74.0 in | Wt 248.0 lb

## 2022-03-02 DIAGNOSIS — E349 Endocrine disorder, unspecified: Secondary | ICD-10-CM | POA: Insufficient documentation

## 2022-03-02 DIAGNOSIS — E291 Testicular hypofunction: Secondary | ICD-10-CM | POA: Diagnosis not present

## 2022-03-02 DIAGNOSIS — N4889 Other specified disorders of penis: Secondary | ICD-10-CM

## 2022-03-02 DIAGNOSIS — R7989 Other specified abnormal findings of blood chemistry: Secondary | ICD-10-CM | POA: Insufficient documentation

## 2022-03-02 DIAGNOSIS — N400 Enlarged prostate without lower urinary tract symptoms: Secondary | ICD-10-CM

## 2022-03-02 LAB — HEMOGLOBIN AND HEMATOCRIT, BLOOD
HCT: 41.9 % (ref 39.0–52.0)
Hemoglobin: 14.2 g/dL (ref 13.0–17.0)

## 2022-03-02 LAB — PSA: Prostatic Specific Antigen: 0.84 ng/mL (ref 0.00–4.00)

## 2022-03-03 LAB — TESTOSTERONE: Testosterone: 102 ng/dL — ABNORMAL LOW (ref 264–916)

## 2022-03-03 MED ORDER — ANDROGEL PUMP 20.25 MG/ACT (1.62%) TD GEL: 4.0000 | Freq: Every day | TRANSDERMAL | 5 refills | Status: DC

## 2022-03-05 ENCOUNTER — Other Ambulatory Visit: Payer: Self-pay

## 2022-03-05 ENCOUNTER — Ambulatory Visit: Payer: Self-pay | Admitting: Urology

## 2022-03-05 ENCOUNTER — Other Ambulatory Visit: Payer: Self-pay | Admitting: Urology

## 2022-03-05 DIAGNOSIS — E349 Endocrine disorder, unspecified: Secondary | ICD-10-CM

## 2022-03-05 MED ORDER — TESTOSTERONE 25 MG/2.5GM (1%) TD GEL: 1.0000 | Freq: Every day | TRANSDERMAL | 3 refills | Status: DC

## 2022-03-05 NOTE — Progress Notes (Addendum)
Morey Hummingbird attempted to reach pt this AM, was unable to leave a message, no VM set up. Notified pt via Mychart (see separate encounter). 1 month lab appt made. Advised pt to call the office if he has any questions.  ?

## 2022-03-05 NOTE — Progress Notes (Signed)
Please let William Frey know that his insurance required [place] to change brands of gel.  Since this is a different gel, he will apply 1 packet daily to clean dry intact skin.  We will then need to recheck his testosterone level in 1 month. ?

## 2022-03-18 DIAGNOSIS — S0552XA Penetrating wound with foreign body of left eyeball, initial encounter: Secondary | ICD-10-CM | POA: Diagnosis not present

## 2022-04-07 ENCOUNTER — Encounter: Payer: Self-pay | Admitting: Urology

## 2022-04-07 ENCOUNTER — Other Ambulatory Visit: Payer: 59

## 2022-05-22 ENCOUNTER — Other Ambulatory Visit: Payer: Self-pay | Admitting: Family

## 2022-05-22 DIAGNOSIS — E119 Type 2 diabetes mellitus without complications: Secondary | ICD-10-CM

## 2022-05-26 NOTE — Telephone Encounter (Signed)
Pt called in requesting for refill on medication (metFORMIN (GLUCOPHAGE-XR) 500 MG 24 hr tablet).... Pt requesting callback.Marland KitchenMarland KitchenMarland Kitchen

## 2022-05-26 NOTE — Telephone Encounter (Signed)
I called and spoke with the patient and informed him that he needed an appointment scheduled to see the provider and he stated he was running out of medicine, I informed him that I would send in a one month supply and he could not get any refills until he calls back and schedule, he would not schedule at the time we were on the phone he stated he would call back. Eduar Kumpf,cma

## 2022-05-28 ENCOUNTER — Telehealth: Payer: Self-pay | Admitting: Family Medicine

## 2022-05-28 DIAGNOSIS — E1165 Type 2 diabetes mellitus with hyperglycemia: Secondary | ICD-10-CM

## 2022-05-28 NOTE — Telephone Encounter (Signed)
We can give jardiance 25 mg samples how many do we have + continue metformin   Continue metformin  Schedule f/u with PCP to sort this out due to cost  Pt can go to ED for iv insulin if cbgs >300 as well

## 2022-05-28 NOTE — Telephone Encounter (Signed)
Patient says he has not been checking sugar regularly, and has not been eating correctly also stopped Jardiance 3 months ago due to cost and not metformin regularly may miss a dose once in a while. Patient sporadically checked sugar  3 days ago and sugar was 324 after eating chicken biscuit fries and sweet tea. Patient then changed diet for 3 days and sugars have still been in the 200's and low 300's patient had hibachi chicken,steak and breaded shrimp with rice last night around 5 and fasting CBG this morning was 270. Last 3 days has been taking Metformin 500 mg 24 hr tabs 2 for a total of 1000 mg every morning with breakfast.  Patient refused evaluation today at [facility] scheduled with PCP for Monday and advised if CBGs spiked to over 300 again needs immediate evaluation or if symptoms develop. Also advised to continue Metformin as prescribed and also advised Hibachi sauces contain sugar, and that starches such as rice and bread turn to sugar and increase blood glucose. Advised patient would give to Doc Of the day and may want to restart Jardiance patient refuses until he can see PCP.

## 2022-05-28 NOTE — Telephone Encounter (Signed)
Per my earlier message patient will not take Jardiance until he see PCP on Monday refuses, and patient has been advised to go to ED if CBG spikes to 300 again patient will continue Metformin as prescribed.

## 2022-05-28 NOTE — Telephone Encounter (Signed)
Patient called stating that for about week he has been having elevated glucose levels around 300 everyday with one day @ 190.   He stated he has not been taking his medicine daily.  He has been feeling weird however can not identify any symptoms.  Caller was placed on hold in order to triage, patient hung up.  Patient was called back no answer.

## 2022-06-01 ENCOUNTER — Ambulatory Visit (INDEPENDENT_AMBULATORY_CARE_PROVIDER_SITE_OTHER): Payer: 59 | Admitting: Family Medicine

## 2022-06-01 ENCOUNTER — Encounter: Payer: Self-pay | Admitting: Family Medicine

## 2022-06-01 VITALS — BP 132/78 | HR 83 | Temp 97.8°F | Ht 74.0 in | Wt 244.0 lb

## 2022-06-01 DIAGNOSIS — E1165 Type 2 diabetes mellitus with hyperglycemia: Secondary | ICD-10-CM

## 2022-06-01 DIAGNOSIS — Z136 Encounter for screening for cardiovascular disorders: Secondary | ICD-10-CM | POA: Diagnosis not present

## 2022-06-01 DIAGNOSIS — E559 Vitamin D deficiency, unspecified: Secondary | ICD-10-CM

## 2022-06-01 DIAGNOSIS — M79671 Pain in right foot: Secondary | ICD-10-CM | POA: Diagnosis not present

## 2022-06-01 DIAGNOSIS — M79672 Pain in left foot: Secondary | ICD-10-CM

## 2022-06-01 HISTORY — DX: Pain in right foot: M79.671

## 2022-06-01 NOTE — Progress Notes (Signed)
Would like to check vitamin D AS WELL

## 2022-06-01 NOTE — Assessment & Plan Note (Addendum)
Discussed that it is diabetes is uncontrolled.  We will check an A1c.  Discussed that we may need to increase his metformin dose though for now he will continue metformin 1000 mg daily.  Discussed we may need to have him restart Jardiance as well.  Discussed the potential for injectable medicines though the patient does not want any injections.  Discussed the potential for Rybelsus as an oral option.  Discussed that his body is likely losing it sensitivity to insulin and that his diabetes may progressively worsen.  Discussed he needs to continue with healthy diet.  Discussed that I did not think his sugar being 324 would have caused him to pass out.  Discussed that I could not say whether or not he passed out or fell asleep based on his history.  Discussed EKG and other cardiac work-up including seeing a cardiologist though he declines that at this time.  I did discuss the importance of getting his diabetes under better control given the risk of limb amputation, kidney disease, stroke, heart attack, or other issues related to uncontrolled diabetes.

## 2022-06-01 NOTE — Patient Instructions (Signed)
Nice to see you. Please continue work on diet and exercise. We will get lab work today and contact you with the results.

## 2022-06-01 NOTE — Progress Notes (Signed)
Tommi Rumps, MD Phone: [phone]  William Frey is a 51 y.o. male who presents today for follow-up.  Diabetes: Patient notes his sugar has been elevated recently.  He got off track on his diet.  He oftentimes has a sausage biscuit muffin for breakfast with hashbrowns.  He will have half-and-half tea.  He has gotten to eating more lean foods and less carbohydrates recently.  He notes often times in the last week his fasting sugar will be in the 160s.  He notes its almost a year since his last ophthalmology visit.  He does note some neuropathy symptoms as well.  Joint pain: Patient notes this is an ongoing issue in his feet and ankles.  He notes at times it will get so significant that his sheet bothers it.  His feet do swell with this.  Patient reports he had an instance where his wife found him on their lay down swing outside on their porch and she felt as though he had passed out.  He thinks he just fell asleep.  He notes she had a little trouble waking him up.  He had had no chest pain or palpitations prior to this.  They checked his sugar and it was 324 and wondered if it was related to that.  He reports a significant cardiac work-up in the past that was negative.  Social History   Tobacco Use  Smoking Status Former   Packs/day: 2.00   Years: 25.00   Total pack years: 50.00   Types: Cigarettes   Quit date: 09/17/2013   Years since quitting: 8.7   Passive exposure: Past  Smokeless Tobacco Never    Current Outpatient Medications on File Prior to Visit  Medication Sig Dispense Refill   albuterol (VENTOLIN HFA) 108 (90 Base) MCG/ACT inhaler Inhale 2 puffs into the lungs every 6 (six) hours as needed for wheezing or shortness of breath. 8 g 0   diazepam (VALIUM) 5 MG tablet Take 1 tablet (5 mg total) by mouth daily as needed for anxiety. 10 tablet 0   glucose blood test strip 1 each by Other route as needed for other (true matrix glucose test strips. E11.9). Use as instructed      Lancets (ONETOUCH ULTRASOFT) lancets Use as instructed 100 each 12   metFORMIN (GLUCOPHAGE-XR) 500 MG 24 hr tablet TAKE 2 TABLETS BY MOUTH ONCE DAILY WITH BREAKFAST 180 tablet 0   Testosterone 25 MG/2.5GM (1%) GEL Place 1 packet onto the skin daily. 90 g 3   JARDIANCE 10 MG TABS tablet TAKE 1 TABLET BY MOUTH ONCE DAILY BEFORE BREAKFAST (Patient not taking: Reported on 06/01/2022) 90 tablet 0   ketoconazole (NIZORAL) 2 % cream Apply 1 application topically daily as needed for irritation (Rash). 15 g 0   No current facility-administered medications on file prior to visit.     ROS see history of present illness  Objective  Physical Exam Vitals:   06/01/22 1453  BP: 132/78  Pulse: 83  Temp: 97.8 F (36.6 C)  SpO2: 99%    BP Readings from Last 3 Encounters:  06/01/22 132/78  03/02/22 136/79  12/16/21 117/78   Wt Readings from Last 3 Encounters:  06/01/22 244 lb (110.7 kg)  03/02/22 248 lb (112.5 kg)  12/04/21 242 lb (109.8 kg)    Physical Exam Constitutional:      General: He is not in acute distress.    Appearance: He is not diaphoretic.  Cardiovascular:     Rate and Rhythm: Normal  rate and regular rhythm.     Heart sounds: Normal heart sounds.  Pulmonary:     Effort: Pulmonary effort is normal.     Breath sounds: Normal breath sounds.  Musculoskeletal:     Right lower leg: No edema.     Left lower leg: No edema.     Comments: No tenderness of his ankles  Skin:    General: Skin is warm and dry.  Neurological:     Mental Status: He is alert.      Assessment/Plan: Please see individual problem list.  Problem List Items Addressed This Visit     Pain in both feet (Chronic)    Chronic ongoing issue.  Certainly this could be related to gout based on his description.  We will check a uric acid.      Relevant Orders   Uric acid   Type 2 diabetes mellitus with hyperglycemia, without long-term current use of insulin (HCC) - Primary (Chronic)    Discussed that  it is diabetes is uncontrolled.  We will check an A1c.  Discussed that we may need to increase his metformin dose though for now he will continue metformin 1000 mg daily.  Discussed we may need to have him restart Jardiance as well.  Discussed the potential for injectable medicines though the patient does not want any injections.  Discussed the potential for Rybelsus as an oral option.  Discussed that his body is likely losing it sensitivity to insulin and that his diabetes may progressively worsen.  Discussed he needs to continue with healthy diet.  Discussed that I did not think his sugar being 324 would have caused him to pass out.  Discussed that I could not say whether or not he passed out or fell asleep based on his history.  Discussed EKG and other cardiac work-up including seeing a cardiologist though he declines that at this time.  I did discuss the importance of getting his diabetes under better control given the risk of limb amputation, kidney disease, stroke, heart attack, or other issues related to uncontrolled diabetes.      Relevant Orders   Comp Met (CMET)   Lipid panel   HgB A1c   Vitamin D deficiency   Relevant Orders   Vitamin D (25 hydroxy)     Return in about 3 months (around 09/01/2022) for Diabetes.   Tommi Rumps, MD [place]

## 2022-06-01 NOTE — Assessment & Plan Note (Signed)
Chronic ongoing issue.  Certainly this could be related to gout based on his description.  We will check a uric acid.

## 2022-06-02 ENCOUNTER — Encounter: Payer: Self-pay | Admitting: Family Medicine

## 2022-06-02 LAB — COMPREHENSIVE METABOLIC PANEL
ALT: 17 U/L (ref 0–53)
AST: 14 U/L (ref 0–37)
Albumin: 4.3 g/dL (ref 3.5–5.2)
Alkaline Phosphatase: 47 U/L (ref 39–117)
BUN: 19 mg/dL (ref 6–23)
CO2: 23 mEq/L (ref 19–32)
Calcium: 8.9 mg/dL (ref 8.4–10.5)
Chloride: 105 mEq/L (ref 96–112)
Creatinine, Ser: 1.17 mg/dL (ref 0.40–1.50)
GFR: 72.96 mL/min (ref >60.00)
Glucose, Bld: 151 mg/dL — ABNORMAL HIGH (ref 70–99)
Potassium: 4.1 mEq/L (ref 3.5–5.1)
Sodium: 138 mEq/L (ref 135–145)
Total Bilirubin: 0.4 mg/dL (ref 0.2–1.2)
Total Protein: 6.9 g/dL (ref 6.0–8.3)

## 2022-06-02 LAB — LIPID PANEL
Cholesterol: 195 mg/dL (ref 0–200)
HDL: 31.7 mg/dL — ABNORMAL LOW (ref >39.00)
NonHDL: 163.42
Total CHOL/HDL Ratio: 6
Triglycerides: 297 mg/dL — ABNORMAL HIGH (ref 0.0–149.0)
VLDL: 59.4 mg/dL — ABNORMAL HIGH (ref 0.0–40.0)

## 2022-06-02 LAB — HEMOGLOBIN A1C: Hgb A1c MFr Bld: 10.5 % — ABNORMAL HIGH (ref 4.6–6.5)

## 2022-06-02 LAB — VITAMIN D 25 HYDROXY (VIT D DEFICIENCY, FRACTURES): VITD: 23.92 ng/mL — ABNORMAL LOW (ref 30.00–100.00)

## 2022-06-02 LAB — URIC ACID: Uric Acid, Serum: 5.8 mg/dL (ref 4.0–7.8)

## 2022-06-02 LAB — LDL CHOLESTEROL, DIRECT: Direct LDL: 122 mg/dL

## 2022-06-29 ENCOUNTER — Telehealth: Payer: Self-pay

## 2022-06-29 DIAGNOSIS — E349 Endocrine disorder, unspecified: Secondary | ICD-10-CM

## 2022-06-29 NOTE — Telephone Encounter (Signed)
Incoming pt requested refill for Testosterone gel.

## 2022-07-01 NOTE — Telephone Encounter (Signed)
Sent pt mychart msg, see separate encounter.

## 2022-07-20 DIAGNOSIS — S76011A Strain of muscle, fascia and tendon of right hip, initial encounter: Secondary | ICD-10-CM | POA: Insufficient documentation

## 2022-07-20 DIAGNOSIS — M7061 Trochanteric bursitis, right hip: Secondary | ICD-10-CM | POA: Diagnosis not present

## 2022-07-20 DIAGNOSIS — M25551 Pain in right hip: Secondary | ICD-10-CM | POA: Diagnosis not present

## 2022-07-20 DIAGNOSIS — M1611 Unilateral primary osteoarthritis, right hip: Secondary | ICD-10-CM | POA: Insufficient documentation

## 2022-08-30 ENCOUNTER — Other Ambulatory Visit: Payer: Self-pay | Admitting: Family Medicine

## 2022-08-30 DIAGNOSIS — E119 Type 2 diabetes mellitus without complications: Secondary | ICD-10-CM

## 2022-10-13 ENCOUNTER — Telehealth: Payer: Self-pay

## 2022-10-13 NOTE — Telephone Encounter (Signed)
Patient states his blood sugar is still running very high and he is interested in the [place].  Patient states he would like to know if he should continue to take the metFORMIN (GLUCOPHAGE-XR) 500 MG 24 hr tablet along with the Ozempic, patient states he is hoping to only have to take one.  Patient states he would like a call back.

## 2022-10-13 NOTE — Telephone Encounter (Signed)
Can we get him scheduled for follow-up to discuss medication options and to get an updated A1c?

## 2022-10-14 NOTE — Telephone Encounter (Signed)
Called and spoke with pt in regards to getting him scheduled to discuss medication options and to get an updated A1c. Pt stated he does not know why when you all discussed Ozempic previously. Pt was put down for an appt 11-23-22 @ 2:15. Pt stated his blood sugar has been running high witht he metforming low 200 but a high of 300's. Pt stated after he ate last night (with out taking the metformin) his sugar was 323 when he took his metformin and ate dinner it was a 303. Pt is worried about his appt being this far out and his sugar being this high. Pt stated he knows a lot of it is his diet but he would like an Rx sent in for Ozempic.

## 2022-10-14 NOTE — Telephone Encounter (Signed)
Noted. Patient has been scheduled for next week to discuss this further.

## 2022-10-22 ENCOUNTER — Encounter: Payer: Self-pay | Admitting: Family Medicine

## 2022-10-23 ENCOUNTER — Telehealth: Payer: Self-pay

## 2022-10-23 ENCOUNTER — Ambulatory Visit: Payer: 59 | Admitting: Family Medicine

## 2022-10-23 NOTE — Telephone Encounter (Signed)
Patient walked in to be seen and was informed that a VM was left that his appointment was cancelled due to the provider being out, patient stated he never received a call that the visit was cancelled.  He was here because his sugars has been running high before he ate last night at 8:45 pm his BS was 228. This morning his BS was 296.  I offered a visit with Mable Paris but he would have to wait at least 1 hour to be seen because she has other patient and he refused to be seen.  Patinet wanted to be scheduled with his provider to discuss and he was scheduled to see the provider next week.  I also informed him to watch what he eats and to monitor his BS and if his BS goes any higher than 300-500 he needed to go to the ER and he understood. Renell Coaxum,cma

## 2022-10-26 ENCOUNTER — Other Ambulatory Visit: Payer: Self-pay | Admitting: Urology

## 2022-10-27 ENCOUNTER — Ambulatory Visit
Admission: EM | Admit: 2022-10-27 | Discharge: 2022-10-27 | Disposition: A | Discharge status: Home / Self Care | Payer: 59 | Attending: Urgent Care | Admitting: Urgent Care

## 2022-10-27 DIAGNOSIS — Z8249 Family history of ischemic heart disease and other diseases of the circulatory system: Secondary | ICD-10-CM | POA: Insufficient documentation

## 2022-10-27 DIAGNOSIS — I10 Essential (primary) hypertension: Secondary | ICD-10-CM | POA: Insufficient documentation

## 2022-10-27 DIAGNOSIS — R6889 Other general symptoms and signs: Secondary | ICD-10-CM

## 2022-10-27 DIAGNOSIS — E119 Type 2 diabetes mellitus without complications: Secondary | ICD-10-CM | POA: Diagnosis not present

## 2022-10-27 DIAGNOSIS — Z1152 Encounter for screening for COVID-19: Secondary | ICD-10-CM | POA: Insufficient documentation

## 2022-10-27 DIAGNOSIS — Z87891 Personal history of nicotine dependence: Secondary | ICD-10-CM | POA: Insufficient documentation

## 2022-10-27 DIAGNOSIS — Z7984 Long term (current) use of oral hypoglycemic drugs: Secondary | ICD-10-CM | POA: Insufficient documentation

## 2022-10-27 DIAGNOSIS — Z8616 Personal history of COVID-19: Secondary | ICD-10-CM | POA: Insufficient documentation

## 2022-10-27 DIAGNOSIS — R509 Fever, unspecified: Secondary | ICD-10-CM | POA: Diagnosis not present

## 2022-10-27 DIAGNOSIS — R059 Cough, unspecified: Secondary | ICD-10-CM | POA: Insufficient documentation

## 2022-10-27 DIAGNOSIS — R062 Wheezing: Secondary | ICD-10-CM | POA: Insufficient documentation

## 2022-10-27 DIAGNOSIS — Z833 Family history of diabetes mellitus: Secondary | ICD-10-CM | POA: Diagnosis not present

## 2022-10-27 LAB — RESP PANEL BY RT-PCR (FLU A&B, COVID) ARPGX2
Influenza A by PCR: POSITIVE — AB
Influenza B by PCR: NEGATIVE
SARS Coronavirus 2 by RT PCR: NEGATIVE

## 2022-10-27 MED ORDER — OSELTAMIVIR PHOSPHATE 75 MG PO CAPS: 75.0000 mg | ORAL_CAPSULE | Freq: Two times a day (BID) | ORAL | 0 refills | Status: DC

## 2022-10-27 MED ORDER — ALBUTEROL SULFATE HFA 108 (90 BASE) MCG/ACT IN AERS: 2.0000 | INHALATION_SPRAY | Freq: Four times a day (QID) | RESPIRATORY_TRACT | 0 refills | Status: DC | PRN

## 2022-10-27 MED ORDER — BENZONATATE 100 MG PO CAPS: ORAL_CAPSULE | ORAL | 0 refills | Status: DC

## 2022-10-27 NOTE — Discharge Instructions (Addendum)
Follow up here or with your primary care provider if your symptoms are worsening or not improving with treatment.     

## 2022-10-27 NOTE — ED Triage Notes (Signed)
Pt. Presents to UC w/ c/o a cough, congestion, body aches and chills for the past 2 days.

## 2022-10-27 NOTE — ED Provider Notes (Signed)
Roderic Palau    CSN: [id number] Arrival date & time: 10/27/22  1912      History   Chief Complaint No chief complaint on file.   HPI JAQUISE FAUX is a 51 y.o. male.   HPI  Presents with complaint of cough, congestion, body aches and chills x 2 days.  Elevated temp in clinic 100.69F with elevated HR = 103 bpm.  PMH includes DM2, HTN  Past Medical History:  Diagnosis Date   Allergy    Anxiety    Arthritis    Asthma    Chicken pox    Complication of anesthesia    difficult to wake up   COVID-19    11/26/20 sp MAB infusion   Diabetes mellitus without complication ([place])    Family history of adverse reaction to anesthesia    father had low tolerance for anesthesia   GERD (gastroesophageal reflux disease)    History of kidney stones    Hyperlipidemia    Hypertension    Low testosterone    Migraines     Patient Active Problem List   Diagnosis Date Noted   Pain in both feet 06/01/2022   History of COVID-19 02/10/2021   Insomnia 12/27/2020   Panic attack 12/27/2020   Left flank pain 10/28/2020   Vitamin D deficiency 10/28/2020   Detachment of glenoid labrum 07/05/2020   Lumbar sprain 07/05/2020   Neck sprain 07/05/2020   Neuropathy 06/28/2020   Hypogonadism in male 06/28/2020   Nephrolithiasis 11/12/2018   Proteinuria 11/12/2018   Screen for STD (sexually transmitted disease) 05/04/2018   Anxiety and depression 11/30/2017   Angiokeratoma of [place] 12/24/2016   Chest pain 11/15/2016   Essential hypertension 11/15/2016   Benign localized hyperplasia of prostate with urinary obstruction 05/15/2016   Hyperlipidemia 12/23/2015   History of nephrolithiasis 11/03/2015   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   Reduced libido 10/31/2015   Type 2 diabetes mellitus with hyperglycemia, without long-term current use of insulin ([place]) 09/23/2015   Low testosterone 09/23/2015   Preventative health care 09/18/2015   Obesity (BMI 30-39.9) 09/18/2015   Elevated  blood-pressure reading without diagnosis of hypertension 09/18/2015    Past Surgical History:  Procedure Laterality Date   CYSTOSCOPY W/ RETROGRADES Right 10/28/2018   Procedure: CYSTOSCOPY WITH RETROGRADE PYELOGRAM;  Surgeon: Billey Co, MD;  Location: [facility] ORS;  Service: Urology;  Laterality: Right;   CYSTOSCOPY/URETEROSCOPY/HOLMIUM LASER/STENT PLACEMENT Right 10/28/2018   Procedure: CYSTOSCOPY/URETEROSCOPY/HOLMIUM LASER/STENT PLACEMENT;  Surgeon: Billey Co, MD;  Location: [facility] ORS;  Service: Urology;  Laterality: Right;   OSTECTOMY METATARSAL Right 2003       Home Medications    Prior to Admission medications   Medication Sig Start Date End Date Taking? Authorizing Provider  albuterol (VENTOLIN HFA) 108 (90 Base) MCG/ACT inhaler Inhale 2 puffs into the lungs every 6 (six) hours as needed for wheezing or shortness of breath. 12/04/21   Flinchum, Kelby Aline, FNP  diazepam (VALIUM) 5 MG tablet Take 1 tablet (5 mg total) by mouth daily as needed for anxiety. 02/10/21   Leone Haven, MD  glucose blood test strip 1 each by Other route as needed for other (true matrix glucose test strips. E11.9). Use as instructed    [provider]  JARDIANCE 10 MG TABS tablet TAKE 1 TABLET BY MOUTH ONCE DAILY BEFORE BREAKFAST Patient not taking: Reported on 06/01/2022 01/12/22   Kennyth Arnold, FNP  ketoconazole (NIZORAL) 2 % cream Apply 1 application topically daily as needed  for irritation (Rash). 05/26/21   Leone Haven, MD  Lancets [facility] ULTRASOFT) lancets Use as instructed 09/23/15   Coral Spikes, DO  metFORMIN (GLUCOPHAGE-XR) 500 MG 24 hr tablet TAKE 2 TABLETS BY MOUTH ONCE DAILY WITH BREAKFAST 08/31/22   Leone Haven, MD  Testosterone 25 MG/2.5GM (1%) GEL Place 1 packet onto the skin daily. 03/05/22   Nori Riis, PA-C    Family History Family History  Problem Relation Age of Onset   Arthritis Mother    Cancer Mother        breast and bone   Heart  Problems Mother    Arthritis Father    Lymphoma Father    Heart disease Father    Stroke Father    Hypertension Father    Clotting disorder Father    Cancer Brother        lung   Heart disease Brother        atriall fibrillation   Diabetes Brother    Heart disease Brother        atrial fibrillation   Diabetes Maternal Aunt    Diabetes Maternal Aunt     Social History Social History   Tobacco Use   Smoking status: Former    Packs/day: 2.00    Years: 25.00    Total pack years: 50.00    Types: Cigarettes    Quit date: 09/17/2013    Years since quitting: 9.1    Passive exposure: Past   Smokeless tobacco: Never  Vaping Use   Vaping Use: Never used  Substance Use Topics   Alcohol use: No    Alcohol/week: 0.0 standard drinks of alcohol    Comment: former   Drug use: No     Allergies   Penicillins   Review of Systems Review of Systems   Physical Exam Triage Vital Signs ED Triage Vitals  Enc Vitals Group     BP      Pulse      Resp      Temp      Temp src      SpO2      Weight      Height      Head Circumference      Peak Flow      Pain Score      Pain Loc      Pain Edu?      Excl. in [place]?    No data found.  Updated Vital Signs There were no vitals taken for this visit.  Visual Acuity Right Eye Distance:   Left Eye Distance:   Bilateral Distance:    Right Eye Near:   Left Eye Near:    Bilateral Near:     Physical Exam Vitals reviewed.  Constitutional:      Appearance: He is ill-appearing.  HENT:     Mouth/Throat:     Pharynx: No oropharyngeal exudate or posterior oropharyngeal erythema.  Pulmonary:     Effort: Pulmonary effort is normal.     Breath sounds: Examination of the right-lower field reveals decreased breath sounds. Examination of the left-lower field reveals decreased breath sounds. Decreased breath sounds present.  Skin:    General: Skin is warm and dry.  Neurological:     General: No focal deficit present.     Mental  Status: He is alert and oriented to person, place, and time.  Psychiatric:        Mood and Affect: Mood normal.  Behavior: Behavior normal.      UC Treatments / Results  Labs (all labs ordered are listed, but only abnormal results are displayed) Labs Reviewed  RESP PANEL BY RT-PCR (FLU A&B, COVID) [id number]    EKG   Radiology No results found.  Procedures Procedures (including critical care time)  Medications Ordered in UC Medications - No data to display  Initial Impression / Assessment and Plan / UC Course  I have reviewed the triage vital signs and the nursing notes.  Pertinent labs & imaging results that were available during my care of the patient were reviewed by me and considered in my medical decision making (see chart for details).   Patient is febrile here without recent antipyretics. Satting well on room air. Overall is ill appearing, though well hydrated and without respiratory distress. Pulmonary exam is remarkable for decreased breath sounds in the lower lobes..   Likely viral process including flu or COVID.  Will treat presumptively for influenza A using Tamiflu since he is still within the treatment window while awaiting results of swab.  If COVID-positive would switch to Paxlovid.  Will give benzonatate for cough and refill his albuterol inhaler.  Final Clinical Impressions(s) / UC Diagnoses   Final diagnoses:  Flu-like symptoms   Discharge Instructions   None    ED Prescriptions   None    PDMP not reviewed this encounter.   Rose Phi, [place] 10/27/22 1941

## 2022-10-28 ENCOUNTER — Telehealth: Payer: Self-pay

## 2022-10-28 NOTE — Telephone Encounter (Signed)
Patient contacted Urgent Care regarding a missed phone call. Patient advised that no note was left in his chart regarding this matter so I was unable to provide any answer for this.   Positive flu test result shared with patient, advised that tamiflu was already called into patient's pharmacy of choice last night.  No further questions.

## 2022-10-30 ENCOUNTER — Ambulatory Visit: Payer: 59 | Admitting: Family Medicine

## 2022-11-04 ENCOUNTER — Telehealth: Payer: 59 | Admitting: Family Medicine

## 2022-11-12 ENCOUNTER — Encounter: Payer: Self-pay | Admitting: Family Medicine

## 2022-11-12 ENCOUNTER — Ambulatory Visit: Payer: 59 | Admitting: Family Medicine

## 2022-11-12 VITALS — BP 130/70 | HR 108 | Temp 98.6°F | Ht 74.0 in | Wt 241.8 lb

## 2022-11-12 DIAGNOSIS — E1165 Type 2 diabetes mellitus with hyperglycemia: Secondary | ICD-10-CM

## 2022-11-12 DIAGNOSIS — F410 Panic disorder [episodic paroxysmal anxiety] without agoraphobia: Secondary | ICD-10-CM | POA: Diagnosis not present

## 2022-11-12 DIAGNOSIS — L989 Disorder of the skin and subcutaneous tissue, unspecified: Secondary | ICD-10-CM | POA: Insufficient documentation

## 2022-11-12 DIAGNOSIS — R69 Illness, unspecified: Secondary | ICD-10-CM | POA: Diagnosis not present

## 2022-11-12 HISTORY — DX: Disorder of the skin and subcutaneous tissue, unspecified: L98.9

## 2022-11-12 LAB — POCT GLYCOSYLATED HEMOGLOBIN (HGB A1C): Hemoglobin A1C: 10.3 % — AB (ref 4.0–5.6)

## 2022-11-12 MED ORDER — SEMAGLUTIDE(0.25 OR 0.5MG/DOS) 2 MG/3ML ~~LOC~~ SOPN: PEN_INJECTOR | SUBCUTANEOUS | 0 refills | Status: DC

## 2022-11-12 NOTE — Progress Notes (Signed)
William Rumps, MD Phone: [phone]  William Frey is a 52 y.o. male who presents today for f/u.  DIABETES Disease Monitoring: Blood Sugar ranges-250+ Polyuria/phagia/dipsia- polydipsia Medications: Compliance- taking metformin Hypoglycemic symptoms- no Patient reports he is cut soda out and has cut down on sweet tea intake.  He tries to limit his portions.  He does not eat lunch.  He notes no matter what he does with his diet and his sugar will come down any further.  Skin lesion: Patient notes on his scalp he has 2 spots that seem to come and go though never completely healed.  These been present for greater than a year.  Panic attacks: Patient notes ongoing issues with this.  He typically gets a panic attack once a week.  He has a lot going on can get wound up very easily.  Some anxiety.  No depression.  He notes he does not want to take something daily.  He does have some Valium on hand at home though has not required many of these.  He notes recently he went to get an EKG because he was having some chest discomfort and panic sensation.  His symptoms resolved after he saw the EKG looked normal.  He notes when he is having a panic attack episode it just keeps getting worse as he thinks about it more.   Social History   Tobacco Use  Smoking Status Former   Packs/day: 2.00   Years: 25.00   Total pack years: 50.00   Types: Cigarettes   Quit date: 09/17/2013   Years since quitting: 9.1   Passive exposure: Past  Smokeless Tobacco Never    Current Outpatient Medications on File Prior to Visit  Medication Sig Dispense Refill   albuterol (VENTOLIN HFA) 108 (90 Base) MCG/ACT inhaler Inhale 2 puffs into the lungs every 6 (six) hours as needed for wheezing or shortness of breath. 8 g 0   benzonatate (TESSALON) 100 MG capsule Take 1-2 tablets 3 times a day as needed for cough 30 capsule 0   diazepam (VALIUM) 5 MG tablet Take 1 tablet (5 mg total) by mouth daily as needed for  anxiety. 10 tablet 0   glucose blood test strip 1 each by Other route as needed for other (true matrix glucose test strips. E11.9). Use as instructed     JARDIANCE 10 MG TABS tablet TAKE 1 TABLET BY MOUTH ONCE DAILY BEFORE BREAKFAST 90 tablet 0   ketoconazole (NIZORAL) 2 % cream Apply 1 application topically daily as needed for irritation (Rash). 15 g 0   Lancets (ONETOUCH ULTRASOFT) lancets Use as instructed 100 each 12   metFORMIN (GLUCOPHAGE-XR) 500 MG 24 hr tablet TAKE 2 TABLETS BY MOUTH ONCE DAILY WITH BREAKFAST 180 tablet 0   oseltamivir (TAMIFLU) 75 MG capsule Take 1 capsule (75 mg total) by mouth every 12 (twelve) hours. 10 capsule 0   Testosterone 25 MG/2.5GM (1%) GEL Place 1 packet onto the skin daily. 90 g 3   No current facility-administered medications on file prior to visit.     ROS see history of present illness  Objective  Physical Exam Vitals:   11/12/22 1422  BP: 130/70  Pulse: (!) 108  Temp: 98.6 F (37 C)  SpO2: 97%    BP Readings from Last 3 Encounters:  11/12/22 130/70  10/27/22 (!) 146/85  06/01/22 132/78   Wt Readings from Last 3 Encounters:  11/12/22 241 lb 12.8 oz (109.7 kg)  06/01/22 244 lb (110.7 kg)  03/02/22 248 lb (112.5 kg)    Physical Exam Constitutional:      General: He is not in acute distress.    Appearance: He is not diaphoretic.  HENT:     Head:   Cardiovascular:     Rate and Rhythm: Normal rate and regular rhythm.     Heart sounds: Normal heart sounds.  Pulmonary:     Effort: Pulmonary effort is normal.     Breath sounds: Normal breath sounds.  Skin:    General: Skin is warm and dry.  Neurological:     Mental Status: He is alert.      Assessment/Plan: Please see individual problem list.  Scalp lesion Assessment & Plan: Refer to dermatology.  Orders: -     Ambulatory referral to Dermatology  Type 2 diabetes mellitus with hyperglycemia, without long-term current use of insulin ([place]) Assessment & Plan: We will  start the patient on ozempic 0.25 mg weekly for 28 days and then increase to 0.5 mg weekly.  They were counseled on the risk of pancreatitis and gallbladder disease.  Discussed the risk of nausea.  They were advised to discontinue the [facility] and contact us immediately if they develop abdominal pain.  If they develop excessive nausea they will contact us right away.  I discussed that medullary thyroid cancer has been seen in rats studies.  The patient confirmed no personal or family history of thyroid cancer, parathyroid cancer, or adrenal gland cancer.  Discussed that we thus far have not seen medullary thyroid cancer result from use of this type of medication in humans.  They were advised to monitor the thyroid area and contact us for any lumps, swelling, trouble swallowing, or any other changes in this area.  Patient would like to discontinue metformin given that we are starting on Ozempic.  Orders: -     POCT glycosylated hemoglobin (Hb A1C) -     Semaglutide(0.25 or 0.'5MG'$ /DOS); Inject 0.25 mg into the skin once a week for 28 days, THEN 0.5 mg once a week.  Dispense: 6 mL; Refill: 0  Panic attack Assessment & Plan: Chronic intermittent issue.  Patient does not want to take anything daily for this.  Discussed he can continue to use Valium as needed that we can also consider using hydroxyzine in the future.     Return in about 3 months (around 02/11/2023) for Diabetes.   William Rumps, MD [place]

## 2022-11-12 NOTE — Assessment & Plan Note (Signed)
Chronic intermittent issue.  Patient does not want to take anything daily for this.  Discussed he can continue to use Valium as needed that we can also consider using hydroxyzine in the future.

## 2022-11-12 NOTE — Assessment & Plan Note (Signed)
We will start the patient on ozempic 0.25 mg weekly for 28 days and then increase to 0.5 mg weekly.  They were counseled on the risk of pancreatitis and gallbladder disease.  Discussed the risk of nausea.  They were advised to discontinue the [facility] and contact us immediately if they develop abdominal pain.  If they develop excessive nausea they will contact us right away.  I discussed that medullary thyroid cancer has been seen in rats studies.  The patient confirmed no personal or family history of thyroid cancer, parathyroid cancer, or adrenal gland cancer.  Discussed that we thus far have not seen medullary thyroid cancer result from use of this type of medication in humans.  They were advised to monitor the thyroid area and contact us for any lumps, swelling, trouble swallowing, or any other changes in this area.  Patient would like to discontinue metformin given that we are starting on Ozempic.

## 2022-11-12 NOTE — Assessment & Plan Note (Signed)
Refer to dermatology 

## 2022-11-12 NOTE — Patient Instructions (Signed)
Nice to see you. With starting the Ozempic please let us know if you develop excessive nausea.  If you develop abdominal pain with this please let us know immediately. Please monitor the front of your neck and if you develop any lumps or difficulty swallowing this could be indicative of a thyroid issue and you should let us know right away.

## 2022-11-23 ENCOUNTER — Ambulatory Visit: Payer: 59 | Admitting: Family Medicine

## 2022-12-01 ENCOUNTER — Telehealth: Payer: Self-pay | Admitting: Family Medicine

## 2022-12-01 DIAGNOSIS — E1165 Type 2 diabetes mellitus with hyperglycemia: Secondary | ICD-10-CM

## 2022-12-01 NOTE — Telephone Encounter (Signed)
Patient has new insurance and the pharmacy is [facility], on [address]. Please send his Ozempic to tha location.

## 2022-12-02 ENCOUNTER — Other Ambulatory Visit: Payer: Self-pay | Admitting: Family Medicine

## 2022-12-02 DIAGNOSIS — E1165 Type 2 diabetes mellitus with hyperglycemia: Secondary | ICD-10-CM

## 2022-12-02 MED ORDER — SEMAGLUTIDE(0.25 OR 0.5MG/DOS) 2 MG/3ML ~~LOC~~ SOPN: PEN_INJECTOR | SUBCUTANEOUS | 0 refills | Status: DC

## 2022-12-02 NOTE — Telephone Encounter (Signed)
Sent to pharmacy 

## 2022-12-02 NOTE — Addendum Note (Signed)
Addended by: Caryl Bis, Carollee Nussbaumer G on: 12/02/2022 11:02 AM   Modules accepted: Orders

## 2022-12-10 ENCOUNTER — Telehealth: Payer: Self-pay | Admitting: Family Medicine

## 2022-12-10 ENCOUNTER — Other Ambulatory Visit (HOSPITAL_COMMUNITY): Payer: Self-pay

## 2022-12-10 NOTE — Telephone Encounter (Signed)
This patient called and stated he needed a PA on his Ozempic.  Docia Klar,cma

## 2022-12-10 NOTE — Telephone Encounter (Signed)
Pharmacy Patient Advocate Encounter   Received notification that prior authorization for Ozempic (0.25 or 0.5 MG/DOSE) '2MG'$ /3ML pen-injectors  is required/requested.  Per Test Claim: Product/service not covered. Plan exclusion   PA submitted on 12/10/22 to (ins) [employer] via CoverMyMeds  Key [id number] Status is pending

## 2022-12-10 NOTE — Telephone Encounter (Signed)
Pt called stating his insurance will not cover the ozempic medication and he need the provider to call his insurance company to let them know the reason why he is using the ozempic and pt also need another ozempic sample

## 2022-12-10 NOTE — Telephone Encounter (Signed)
Patient is asking for another sample of the Ozempic, can I give him another sample, I sent the PA team a message because he needs a PA on it.  Marlicia Sroka,cma

## 2022-12-12 ENCOUNTER — Telehealth: Payer: Self-pay

## 2022-12-14 ENCOUNTER — Encounter: Payer: Self-pay | Admitting: Family Medicine

## 2022-12-14 MED ORDER — TRULICITY 0.75 MG/0.5ML ~~LOC~~ SOAJ: 0.7500 mg | SUBCUTANEOUS | 1 refills | Status: DC

## 2022-12-14 NOTE — Telephone Encounter (Signed)
I called and spoke with the patient and informed him that the insurance would not cover the Ozempic but it would cover the Trulicity, patient wants to know the difference in the  side effects of the Trulicity versus the Ozempic because he is comfortable on the Ozempic.  Shoshanah Dapper,cma

## 2022-12-14 NOTE — Telephone Encounter (Signed)
Noted.  Please advise the patient that his plan would not cover Ozempic but they will supposedly cover Trulicity.  Trulicity functions the same way as the Ozempic and is a once weekly shot.  I have sent that to his pharmacy for him to start on.

## 2022-12-14 NOTE — Telephone Encounter (Signed)
The side effect profile should be essentially the same. The two medications function the same way.

## 2022-12-14 NOTE — Telephone Encounter (Signed)
Pharmacy Patient Advocate Encounter  Received notification from [facility] [employer] that the request for prior authorization for Ozempic (0.25 or 0.5 MG/DOSE) '2MG'$ /3ML pen-injectors has been denied due to not trying and failing the required number of formulary alternatives (Victoza, Trulicity)    Please be advised we currently do not have a Pharmacist to review denials, therefore you will need to process appeals accordingly as needed. Thanks for your support at this time.   You may call [phone] or fax [phone], to appeal.

## 2022-12-14 NOTE — Telephone Encounter (Signed)
Noted. Please look at the other phone message.

## 2022-12-15 MED ORDER — SEMAGLUTIDE(0.25 OR 0.5MG/DOS) 2 MG/3ML ~~LOC~~ SOPN: 0.5000 mg | PEN_INJECTOR | SUBCUTANEOUS | 3 refills | Status: DC

## 2022-12-15 NOTE — Telephone Encounter (Signed)
Patient sent a mychart message and I sent it to the provider.  Maty Zeisler,cma

## 2022-12-23 NOTE — Telephone Encounter (Signed)
PA Ozempic

## 2022-12-28 ENCOUNTER — Telehealth: Payer: Self-pay

## 2022-12-28 NOTE — Telephone Encounter (Signed)
Medication Samples have been provided to the patient.  Drug name: Ozempic       Strength: 0.5 mg         Qty: 1 pen  LOT: [id number]  Exp.Date: 05/08/2024  Dosing instructions: Inject 0.5 into the skin once weekly  The patient has been instructed regarding the correct time, dose, and frequency of taking this medication, including desired effects and most common side effects.   ESuzanna Obey3:56 PM 12/28/2022

## 2022-12-28 NOTE — Telephone Encounter (Signed)
Pt called in regards of previous message. Pt aware. He will come pick up today.

## 2022-12-28 NOTE — Telephone Encounter (Signed)
We can give him a sample of ozempic 0.5 mg weekly to cover for 2 weeks. Though we really need to sort out what is going on with with his PA as we can not continue to cover with samples long term.

## 2022-12-29 ENCOUNTER — Other Ambulatory Visit (HOSPITAL_COMMUNITY): Payer: Self-pay

## 2022-12-29 DIAGNOSIS — E1165 Type 2 diabetes mellitus with hyperglycemia: Secondary | ICD-10-CM | POA: Diagnosis not present

## 2022-12-29 DIAGNOSIS — L03031 Cellulitis of right toe: Secondary | ICD-10-CM | POA: Diagnosis not present

## 2022-12-29 DIAGNOSIS — M79671 Pain in right foot: Secondary | ICD-10-CM | POA: Diagnosis not present

## 2022-12-29 NOTE — Telephone Encounter (Signed)
Per test claim, PA not required. Med has been d/c'd, is PA still needed?

## 2022-12-31 NOTE — Telephone Encounter (Signed)
Per 12/14/2022 encounter, Pt needs to have trial/ failure of Victoza and/or Trulicity before insurance will pay for Ozempic. Please change if appropriate, otherwise, please proceed with appeal.   Please be advised we currently do not have a Pharmacist to review denials, therefore you will need to process appeals accordingly as needed. Thanks for your support at this time.   You may call [phone] or fax [phone], to appeal.

## 2023-01-01 MED ORDER — TRULICITY 0.75 MG/0.5ML ~~LOC~~ SOAJ: 0.7500 mg | SUBCUTANEOUS | 1 refills | Status: DC

## 2023-01-12 DIAGNOSIS — L03031 Cellulitis of right toe: Secondary | ICD-10-CM | POA: Diagnosis not present

## 2023-01-12 DIAGNOSIS — E1165 Type 2 diabetes mellitus with hyperglycemia: Secondary | ICD-10-CM | POA: Diagnosis not present

## 2023-04-01 DIAGNOSIS — H52223 Regular astigmatism, bilateral: Secondary | ICD-10-CM | POA: Diagnosis not present

## 2023-04-01 DIAGNOSIS — E119 Type 2 diabetes mellitus without complications: Secondary | ICD-10-CM | POA: Diagnosis not present

## 2023-04-01 DIAGNOSIS — H5213 Myopia, bilateral: Secondary | ICD-10-CM | POA: Diagnosis not present

## 2023-04-01 DIAGNOSIS — H524 Presbyopia: Secondary | ICD-10-CM | POA: Diagnosis not present

## 2023-04-01 DIAGNOSIS — Z7984 Long term (current) use of oral hypoglycemic drugs: Secondary | ICD-10-CM | POA: Diagnosis not present

## 2023-04-01 LAB — HM DIABETES EYE EXAM

## 2023-04-02 ENCOUNTER — Encounter: Payer: Self-pay | Admitting: Family Medicine

## 2023-04-02 ENCOUNTER — Ambulatory Visit: Payer: 59 | Admitting: Family Medicine

## 2023-04-02 VITALS — BP 104/72 | HR 82 | Temp 98.3°F | Ht 74.0 in | Wt 242.4 lb

## 2023-04-02 DIAGNOSIS — E1165 Type 2 diabetes mellitus with hyperglycemia: Secondary | ICD-10-CM | POA: Diagnosis not present

## 2023-04-02 DIAGNOSIS — Z7985 Long-term (current) use of injectable non-insulin antidiabetic drugs: Secondary | ICD-10-CM

## 2023-04-02 DIAGNOSIS — Z87891 Personal history of nicotine dependence: Secondary | ICD-10-CM

## 2023-04-02 DIAGNOSIS — E119 Type 2 diabetes mellitus without complications: Secondary | ICD-10-CM

## 2023-04-02 DIAGNOSIS — E785 Hyperlipidemia, unspecified: Secondary | ICD-10-CM

## 2023-04-02 DIAGNOSIS — Z7984 Long term (current) use of oral hypoglycemic drugs: Secondary | ICD-10-CM

## 2023-04-02 DIAGNOSIS — F439 Reaction to severe stress, unspecified: Secondary | ICD-10-CM

## 2023-04-03 LAB — COMPREHENSIVE METABOLIC PANEL
AG Ratio: 1.4 (calc) (ref 1.0–2.5)
ALT: 14 U/L (ref 9–46)
AST: 11 U/L (ref 10–35)
Albumin: 4.1 g/dL (ref 3.6–5.1)
Alkaline phosphatase (APISO): 56 U/L (ref 35–144)
BUN: 21 mg/dL (ref 7–25)
CO2: 21 mmol/L (ref 20–32)
Calcium: 9.1 mg/dL (ref 8.6–10.3)
Chloride: 103 mmol/L (ref 98–110)
Creat: 1.21 mg/dL (ref 0.70–1.30)
Globulin: 3 g/dL (calc) (ref 1.9–3.7)
Glucose, Bld: 328 mg/dL — ABNORMAL HIGH (ref 65–99)
Potassium: 4.4 mmol/L (ref 3.5–5.3)
Sodium: 135 mmol/L (ref 135–146)
Total Bilirubin: 0.3 mg/dL (ref 0.2–1.2)
Total Protein: 7.1 g/dL (ref 6.1–8.1)

## 2023-04-03 LAB — LIPID PANEL
Cholesterol: 223 mg/dL — ABNORMAL HIGH (ref <200)
HDL: 33 mg/dL — ABNORMAL LOW (ref >=40)
Non-HDL Cholesterol (Calc): 190 mg/dL (calc) — ABNORMAL HIGH (ref <130)
Total CHOL/HDL Ratio: 6.8 (calc) — ABNORMAL HIGH (ref <5.0)
Triglycerides: 521 mg/dL — ABNORMAL HIGH (ref <150)

## 2023-04-03 LAB — HEMOGLOBIN A1C
Hgb A1c MFr Bld: 10.2 % of total Hgb — ABNORMAL HIGH (ref <5.7)
Mean Plasma Glucose: 246 mg/dL
eAG (mmol/L): 13.6 mmol/L

## 2023-04-06 DIAGNOSIS — Z87891 Personal history of nicotine dependence: Secondary | ICD-10-CM | POA: Insufficient documentation

## 2023-04-06 DIAGNOSIS — F439 Reaction to severe stress, unspecified: Secondary | ICD-10-CM | POA: Insufficient documentation

## 2023-04-06 MED ORDER — LANCETS MISC. MISC
1.0000 | Freq: Three times a day (TID) | 2 refills | Status: AC
Start: 2023-04-06 — End: ?

## 2023-04-06 MED ORDER — LANCET DEVICE MISC
1.0000 | Freq: Three times a day (TID) | 0 refills | Status: AC
Start: 2023-04-06 — End: ?

## 2023-04-06 MED ORDER — BLOOD GLUCOSE MONITORING SUPPL DEVI
1.0000 | Freq: Three times a day (TID) | 0 refills | Status: AC
Start: 2023-04-06 — End: ?

## 2023-04-06 MED ORDER — BLOOD GLUCOSE TEST VI STRP
1.0000 | ORAL_STRIP | Freq: Three times a day (TID) | 1 refills | Status: AC
Start: 2023-04-06 — End: ?

## 2023-04-06 NOTE — Progress Notes (Signed)
Marikay Alar, MD Phone: [phone]  William Frey is a 52 y.o. male who presents today for follow-up.  Diabetes: Patient has discontinued Jardiance, metformin, and Trulicity.  He notes when he was on Ozempic previously he did feel better on it.  He does not understand why his insurance will not pay for the Ozempic.  He notes his sugar did come down on this.  Patient notes since coming off Ozempic he has continued to eat less volumes of food.  He has been off of this more than 1 to 2 months.  Ophthalmology is up-to-date.  Former smoker: Patient does report an extensive smoking history in the past.  It has been about 10 years since he quit smoking.  Stress: Patient notes significant stress related to his job.  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  Patient also reports one of his friends died of an aneurysm recently and another was shot to death doing the same job that the patient does.  Social History   Tobacco Use  Smoking Status Former   Packs/day: 2.00   Years: 25.00   Additional pack years: 0.00   Total pack years: 50.00   Types: Cigarettes   Quit date: 09/17/2013   Years since quitting: 9.5   Passive exposure: Past  Smokeless Tobacco Never    Current Outpatient Medications on File Prior to Visit  Medication Sig Dispense Refill   albuterol (VENTOLIN HFA) 108 (90 Base) MCG/ACT inhaler Inhale 2 puffs into the lungs every 6 (six) hours as needed for wheezing or shortness of breath. 8 g 0   diazepam (VALIUM) 5 MG tablet Take 1 tablet (5 mg total) by mouth daily as needed for anxiety. 10 tablet 0   Dulaglutide (TRULICITY) 0.75 MG/0.5ML SOPN Inject 0.75 mg into the skin once a week. 6 mL 1   JARDIANCE 10 MG TABS tablet TAKE 1 TABLET BY MOUTH ONCE DAILY BEFORE BREAKFAST 90 tablet 0   ketoconazole (NIZORAL) 2 % cream Apply 1 application topically daily as needed for irritation (Rash). 15 g 0   Lancets (ONETOUCH ULTRASOFT) lancets Use as instructed 100 each 12    metFORMIN (GLUCOPHAGE-XR) 500 MG 24 hr tablet TAKE 2 TABLETS BY MOUTH ONCE DAILY WITH BREAKFAST 180 tablet 0   Testosterone 25 MG/2.5GM (1%) GEL Place 1 packet onto the skin daily. 90 g 3   No current facility-administered medications on file prior to visit.     ROS see history of present illness  Objective  Physical Exam Vitals:   04/02/23 1608  BP: 104/72  Pulse: 82  Temp: 98.3 F (36.8 C)  SpO2: 96%    BP Readings from Last 3 Encounters:  04/02/23 104/72  11/12/22 130/70  10/27/22 (!) 146/85   Wt Readings from Last 3 Encounters:  04/02/23 242 lb 6.4 oz (110 kg)  11/12/22 241 lb 12.8 oz (109.7 kg)  06/01/22 244 lb (110.7 kg)    Physical Exam Constitutional:      General: He is not in acute distress.    Appearance: He is not diaphoretic.  Cardiovascular:     Rate and Rhythm: Normal rate and regular rhythm.     Heart sounds: Normal heart sounds.  Pulmonary:     Effort: Pulmonary effort is normal.     Breath sounds: Normal breath sounds.  Skin:    General: Skin is warm and dry.  Neurological:     Mental Status: He is alert.      Assessment/Plan: Please see individual  problem list.  Type 2 diabetes mellitus with hyperglycemia, without long-term current use of insulin ([facility]) Assessment & Plan: Chronic issue.  Suspect uncontrolled given that he has been off of medication.  We will check an A1c today and plan to restart Trulicity 0.75 mg weekly as well as metformin XR 1000 mg daily and Jardiance 10 mg daily once his A1c result returns.  Orders: -     Comprehensive metabolic panel -     Lipid panel -     Hemoglobin A1c -     Blood Glucose Monitoring Suppl; 1 each by Does not apply route in the morning, at noon, and at bedtime. May substitute to any manufacturer covered by patient's insurance.  Dispense: 1 each; Refill: 0 -     Blood Glucose Test; 1 each by In Vitro route in the morning, at noon, and at bedtime. May substitute to any manufacturer covered by  patient's insurance.  Dispense: 100 strip; Refill: 1 -     Lancet Device; 1 each by Does not apply route in the morning, at noon, and at bedtime. May substitute to any manufacturer covered by patient's insurance.  Dispense: 1 each; Refill: 0 -     Lancets Misc.; 1 each by Does not apply route in the morning, at noon, and at bedtime. May substitute to any manufacturer covered by patient's insurance.  Dispense: 100 each; Refill: 2  Hyperlipidemia, unspecified hyperlipidemia type -     Comprehensive metabolic panel -     Lipid panel  Former smoker Assessment & Plan: Chronic issue.  Refer for lung cancer screening.  Orders: -     Ambulatory Referral for Lung Cancer Scre  Stress Assessment & Plan: Patient with significant stressors in his life and work.  Patient notes he does not feel as though he needs medication for this at this time.  He will monitor.    Return in about 3 months (around 07/03/2023) for DM.   Marikay Alar, MD [facility]

## 2023-04-06 NOTE — Assessment & Plan Note (Signed)
Chronic issue.  Suspect uncontrolled given that he has been off of medication.  We will check an A1c today and plan to restart Trulicity 0.75 mg weekly as well as metformin XR 1000 mg daily and Jardiance 10 mg daily once his A1c result returns.

## 2023-04-06 NOTE — Assessment & Plan Note (Signed)
Patient with significant stressors in his life and work.  Patient notes he does not feel as though he needs medication for this at this time.  He will monitor.

## 2023-04-06 NOTE — Assessment & Plan Note (Signed)
Chronic issue.  Refer for lung cancer screening.

## 2023-04-14 ENCOUNTER — Other Ambulatory Visit: Payer: Self-pay | Admitting: Family Medicine

## 2023-04-14 DIAGNOSIS — E785 Hyperlipidemia, unspecified: Secondary | ICD-10-CM

## 2023-04-14 MED ORDER — ROSUVASTATIN CALCIUM 20 MG PO TABS
20.0000 mg | ORAL_TABLET | Freq: Every day | ORAL | 3 refills | Status: DC
Start: 2023-04-14 — End: 2023-07-07

## 2023-05-10 NOTE — Telephone Encounter (Signed)
Pt called in stating that he haven't had his med Dulaglutide (TRULICITY) 0.75 MG/0.5ML SOPN because [facility] had told him that he needs PA. Please advice.

## 2023-05-11 ENCOUNTER — Other Ambulatory Visit: Payer: Self-pay

## 2023-05-11 MED ORDER — TRULICITY 0.75 MG/0.5ML ~~LOC~~ SOAJ: 0.7500 mg | SUBCUTANEOUS | 1 refills | Status: DC

## 2023-05-14 NOTE — Telephone Encounter (Signed)
PA is needed

## 2023-05-17 ENCOUNTER — Other Ambulatory Visit (HOSPITAL_COMMUNITY): Payer: Self-pay

## 2023-05-17 ENCOUNTER — Telehealth: Payer: Self-pay

## 2023-05-17 NOTE — Telephone Encounter (Signed)
Left message to call the office back.

## 2023-05-17 NOTE — Telephone Encounter (Signed)
Called Patient to let him know that is his Insurance preference not ours.

## 2023-05-17 NOTE — Telephone Encounter (Signed)
Pt returned [facility] CMA call. Unable to transfer. Note below was read to him. [facility] stated [facility], [facility].

## 2023-05-17 NOTE — Telephone Encounter (Signed)
Left message to call the office back regarding the PA approval for the Trulicity with [facility] through 05/16/24. Patient needs to let [facility] know so they will fill it.

## 2023-05-17 NOTE — Telephone Encounter (Signed)
Patient Advocate Encounter  Prior Authorization for Trulicity 0.75MG /0.5ML pen-injectors has been approved with [employer].    PA# [id number] Effective dates: 05/17/23 through 05/16/24

## 2023-05-17 NOTE — Telephone Encounter (Signed)
Pharmacy Patient Advocate Encounter   Received notification from [facility] that prior authorization for Trulicity 0.75MG /0.5ML pen-injectors is required/requested.   PA submitted to [facility] via CoverMyMeds Key or (Medicaid) confirmation # [id number] Status is pending

## 2023-05-18 NOTE — Telephone Encounter (Signed)
Noted  

## 2023-05-18 NOTE — Telephone Encounter (Signed)
Patient had called the office this morning and asked to speak to me. Patient proceeds to holler and be ill due to him being out of his medication and I did not know he was out of his medcation until he told me. Then he is also ill due to this PA and the Pharmacies and what happened in the past with his medications at the Pharmacy. Patient continued to holler and I apologized and told him I would call and find out about his medication and if he could not get it today I would see if we had samples in the office. Spoke to [facility] on [address] and the Pharmacy states they have the prior authorization and that his medication will be ready within the hour so I called and let the Patient know. Patient was still ill and never apologized just kept fussing and hollering telling me about me wasting his time but he was actually talking about the Pharmacy and getting his medication.

## 2023-05-18 NOTE — Telephone Encounter (Signed)
Pt called back and needs clarification on where his meds at. He saying he's sugar its high. Pt stated he will like a call back from [facility]

## 2023-05-27 ENCOUNTER — Other Ambulatory Visit: Payer: 59

## 2023-06-01 ENCOUNTER — Encounter: Payer: Self-pay | Admitting: Emergency Medicine

## 2023-06-01 ENCOUNTER — Other Ambulatory Visit: Payer: Self-pay | Admitting: Emergency Medicine

## 2023-06-01 DIAGNOSIS — Z122 Encounter for screening for malignant neoplasm of respiratory organs: Secondary | ICD-10-CM

## 2023-06-01 DIAGNOSIS — Z87891 Personal history of nicotine dependence: Secondary | ICD-10-CM

## 2023-06-09 ENCOUNTER — Encounter (INDEPENDENT_AMBULATORY_CARE_PROVIDER_SITE_OTHER): Payer: Self-pay

## 2023-06-28 ENCOUNTER — Ambulatory Visit: Payer: 59 | Admitting: Family Medicine

## 2023-07-06 ENCOUNTER — Ambulatory Visit: Payer: 59 | Admitting: Family Medicine

## 2023-07-07 ENCOUNTER — Ambulatory Visit: Payer: 59 | Admitting: Family Medicine

## 2023-07-07 ENCOUNTER — Encounter: Payer: Self-pay | Admitting: Family Medicine

## 2023-07-07 VITALS — BP 122/78 | HR 95 | Temp 97.9°F | Ht 74.0 in | Wt 245.2 lb

## 2023-07-07 DIAGNOSIS — E1165 Type 2 diabetes mellitus with hyperglycemia: Secondary | ICD-10-CM

## 2023-07-07 DIAGNOSIS — E291 Testicular hypofunction: Secondary | ICD-10-CM | POA: Diagnosis not present

## 2023-07-07 DIAGNOSIS — E785 Hyperlipidemia, unspecified: Secondary | ICD-10-CM | POA: Diagnosis not present

## 2023-07-07 DIAGNOSIS — Z7985 Long-term (current) use of injectable non-insulin antidiabetic drugs: Secondary | ICD-10-CM

## 2023-07-07 LAB — POCT GLYCOSYLATED HEMOGLOBIN (HGB A1C): Hemoglobin A1C: 9.1 % — AB (ref 4.0–5.6)

## 2023-07-07 MED ORDER — ROSUVASTATIN CALCIUM 20 MG PO TABS
20.0000 mg | ORAL_TABLET | Freq: Every day | ORAL | 3 refills | Status: DC
Start: 2023-07-07 — End: 2023-07-30

## 2023-07-07 MED ORDER — SEMAGLUTIDE(0.25 OR 0.5MG/DOS) 2 MG/3ML ~~LOC~~ SOPN
PEN_INJECTOR | SUBCUTANEOUS | 2 refills | Status: DC
Start: 2023-07-07 — End: 2023-08-25

## 2023-07-07 NOTE — Assessment & Plan Note (Signed)
Chronic issue.  We will switch him over to Ozempic 0.25 mg weekly for 4 weeks followed by 0.5 mg weekly ongoing.  Discussed we may need to get authorization from his insurance company for this.  Check A1c and urine microalbumin creatinine ratio today.

## 2023-07-07 NOTE — Patient Instructions (Signed)
Nice to see you. Urology should contact you to schedule a visit. Will try to get you Ozempic to take for your diabetes. Please start on the Crestor.  Will recheck your cholesterol in 6 weeks.

## 2023-07-07 NOTE — Assessment & Plan Note (Signed)
Chronic issue.  Patient needs to follow-up with urology for this.  Referral placed.

## 2023-07-07 NOTE — Progress Notes (Signed)
Marikay Alar, MD Phone: [phone]  William Frey is a 52 y.o. male who presents today for follow-up.  Diabetes: Patient notes he has not been checking his sugar.  He has been using Trulicity 0.75 mg weekly though notes bloating and constipation and stomach discomfort with this.  He also gets some bruising with the needle.  He notes he needs to work on his diet.  He notes when he was on Ozempic that worked much better for him.  It curbed his appetite and helped his sugar levels.  Hypogonadism: Patient has been off of his testosterone supplementation for quite some time.  He wonders if he needs to follow-up with urology for this.  Social History   Tobacco Use  Smoking Status Former   Current packs/day: 0.00   Types: Cigarettes   Quit date: 09/17/2013   Years since quitting: 9.8   Passive exposure: Past  Smokeless Tobacco Never  Tobacco Comments   Patient smoked on and off from 1989 to 2015. Patient has smoked a total of 14 years and averaged 1.5 packs per day = 21 pack year history.    Current Outpatient Medications on File Prior to Visit  Medication Sig Dispense Refill   albuterol (VENTOLIN HFA) 108 (90 Base) MCG/ACT inhaler Inhale 2 puffs into the lungs every 6 (six) hours as needed for wheezing or shortness of breath. 8 g 0   Blood Glucose Monitoring Suppl DEVI 1 each by Does not apply route in the morning, at noon, and at bedtime. May substitute to any manufacturer covered by patient's insurance. 1 each 0   diazepam (VALIUM) 5 MG tablet Take 1 tablet (5 mg total) by mouth daily as needed for anxiety. 10 tablet 0   Glucose Blood (BLOOD GLUCOSE TEST STRIPS) STRP 1 each by In Vitro route in the morning, at noon, and at bedtime. May substitute to any manufacturer covered by patient's insurance. 100 strip 1   JARDIANCE 10 MG TABS tablet TAKE 1 TABLET BY MOUTH ONCE DAILY BEFORE BREAKFAST 90 tablet 0   ketoconazole (NIZORAL) 2 % cream Apply 1 application topically daily as needed  for irritation (Rash). 15 g 0   Lancet Device MISC 1 each by Does not apply route in the morning, at noon, and at bedtime. May substitute to any manufacturer covered by patient's insurance. 1 each 0   Lancets (ONETOUCH ULTRASOFT) lancets Use as instructed 100 each 12   Lancets Misc. MISC 1 each by Does not apply route in the morning, at noon, and at bedtime. May substitute to any manufacturer covered by patient's insurance. 100 each 2   metFORMIN (GLUCOPHAGE-XR) 500 MG 24 hr tablet TAKE 2 TABLETS BY MOUTH ONCE DAILY WITH BREAKFAST 180 tablet 0   Testosterone 25 MG/2.5GM (1%) GEL Place 1 packet onto the skin daily. 90 g 3   No current facility-administered medications on file prior to visit.     ROS see history of present illness  Objective  Physical Exam Vitals:   07/07/23 1150  BP: 122/78  Pulse: 95  Temp: 97.9 F (36.6 C)  SpO2: 96%    BP Readings from Last 3 Encounters:  07/07/23 122/78  04/02/23 104/72  11/12/22 130/70   Wt Readings from Last 3 Encounters:  07/07/23 245 lb 3.2 oz (111.2 kg)  04/02/23 242 lb 6.4 oz (110 kg)  11/12/22 241 lb 12.8 oz (109.7 kg)    Physical Exam Constitutional:      General: He is not in acute distress.  Appearance: He is not diaphoretic.  Cardiovascular:     Rate and Rhythm: Normal rate and regular rhythm.     Heart sounds: Normal heart sounds.  Pulmonary:     Effort: Pulmonary effort is normal.     Breath sounds: Normal breath sounds.  Skin:    General: Skin is warm and dry.  Neurological:     Mental Status: He is alert.      Assessment/Plan: Please see individual problem list.  Type 2 diabetes mellitus with hyperglycemia, without long-term current use of insulin (HCC) Assessment & Plan: Chronic issue.  We will switch him over to Ozempic 0.25 mg weekly for 4 weeks followed by 0.5 mg weekly ongoing.  Discussed we may need to get authorization from his insurance company for this.  Check A1c and urine microalbumin creatinine  ratio today.  Orders: -     POCT glycosylated hemoglobin (Hb A1C) -     Microalbumin / creatinine urine ratio -     Semaglutide(0.25 or 0.5MG /DOS); Inject 0.25 mg into the skin once a week for 28 days, THEN 0.5 mg once a week.  Dispense: 3 mL; Refill: 2  Hyperlipidemia, unspecified hyperlipidemia type Assessment & Plan: Chronic issue.  Discussed patient does need to work on diet changes to help with this though regardless of what changes he makes I would recommend he go on a statin given his history of diabetes.  He will start Crestor 20 mg daily and return in 6 weeks for labs.  Orders: -     Rosuvastatin Calcium; Take 1 tablet (20 mg total) by mouth daily.  Dispense: 90 tablet; Refill: 3 -     Hepatic function panel; Future -     Lipid panel; Future  Hypogonadism in male Assessment & Plan: Chronic issue.  Patient needs to follow-up with urology for this.  Referral placed.  Orders: -     Ambulatory referral to Urology   Return in about 6 weeks (around 08/18/2023) for Labs, 3 months PCP.   Marikay Alar, MD [facility]

## 2023-07-07 NOTE — Assessment & Plan Note (Signed)
Chronic issue.  Discussed patient does need to work on diet changes to help with this though regardless of what changes he makes I would recommend he go on a statin given his history of diabetes.  He will start Crestor 20 mg daily and return in 6 weeks for labs.

## 2023-07-09 DIAGNOSIS — H52223 Regular astigmatism, bilateral: Secondary | ICD-10-CM | POA: Diagnosis not present

## 2023-07-09 DIAGNOSIS — E119 Type 2 diabetes mellitus without complications: Secondary | ICD-10-CM | POA: Diagnosis not present

## 2023-07-09 DIAGNOSIS — H524 Presbyopia: Secondary | ICD-10-CM | POA: Diagnosis not present

## 2023-07-09 DIAGNOSIS — Z7984 Long term (current) use of oral hypoglycemic drugs: Secondary | ICD-10-CM | POA: Diagnosis not present

## 2023-07-09 DIAGNOSIS — H5213 Myopia, bilateral: Secondary | ICD-10-CM | POA: Diagnosis not present

## 2023-07-13 ENCOUNTER — Ambulatory Visit
Admission: RE | Admit: 2023-07-13 | Discharge: 2023-07-13 | Disposition: A | Discharge status: Home / Self Care | Payer: 59 | Source: Ambulatory Visit | Attending: Acute Care | Admitting: Acute Care

## 2023-07-13 ENCOUNTER — Ambulatory Visit (INDEPENDENT_AMBULATORY_CARE_PROVIDER_SITE_OTHER): Payer: 59 | Admitting: Acute Care

## 2023-07-13 ENCOUNTER — Encounter: Payer: Self-pay | Admitting: Acute Care

## 2023-07-13 DIAGNOSIS — Z87891 Personal history of nicotine dependence: Secondary | ICD-10-CM

## 2023-07-13 DIAGNOSIS — Z122 Encounter for screening for malignant neoplasm of respiratory organs: Secondary | ICD-10-CM

## 2023-07-13 NOTE — Patient Instructions (Signed)

## 2023-07-13 NOTE — Addendum Note (Signed)
Addended by: Glori Luis on: 07/13/2023 08:39 AM   Modules accepted: Orders

## 2023-07-13 NOTE — Progress Notes (Signed)
Virtual Visit via Telephone Note  I connected with William Frey on 07/13/23 at  1:30 PM EDT by telephone and verified that I am speaking with the correct person using two identifiers.  Location: Patient:  At home Provider:  [address], [place], [place], [address]    I discussed the limitations, risks, security and privacy concerns of performing an evaluation and management service by telephone and the availability of in person appointments. I also discussed with the patient that there may be a patient responsible charge related to this service. The patient expressed understanding and agreed to proceed.   Shared Decision Making Visit Lung Cancer Screening Program [phone])   Eligibility: Age 52 y.o. Pack Years Smoking History Calculation 21 pack year smoking history (# packs/per year x # years smoked) Recent History of coughing up blood  no Unexplained weight loss? no ( >Than 15 pounds within the last 6 months ) Prior History Lung / other cancer no (Diagnosis within the last 5 years already requiring surveillance chest CT Scans). Smoking Status Former Smoker Former Smokers: Years since quit: 9 years  Quit Date: 09/17/2013  Visit Components: Discussion included one or more decision making aids. yes Discussion included risk/benefits of screening. yes Discussion included potential follow up diagnostic testing for abnormal scans. yes Discussion included meaning and risk of over diagnosis. yes Discussion included meaning and risk of False Positives. yes Discussion included meaning of total radiation exposure. yes  Counseling Included: Importance of adherence to annual lung cancer LDCT screening. yes Impact of comorbidities on ability to participate in the program. yes Ability and willingness to under diagnostic treatment. yes  Smoking Cessation Counseling: Current Smokers:  Discussed importance of smoking cessation. yes Information about tobacco cessation classes and  interventions provided to patient. yes Patient provided with "ticket" for LDCT Scan. yes Symptomatic Patient. no  Counseling NA Diagnosis Code: Tobacco Use Z72.0 Asymptomatic Patient yes  Counseling (Intermediate counseling: > three minutes counseling) W2585 Former Smokers:  Discussed the importance of maintaining cigarette abstinence. yes Diagnosis Code: Personal History of Nicotine Dependence. I77.824 Information about tobacco cessation classes and interventions provided to patient. Yes Patient provided with "ticket" for LDCT Scan. yes Written Order for Lung Cancer Screening with LDCT placed in Epic. Yes (CT Chest Lung Cancer Screening Low Dose W/O CM) [id number] Z12.2-Screening of respiratory organs Z87.891-Personal history of nicotine dependence  I spent 25 minutes of face to face time/virtual visit time  with  William Frey discussing the risks and benefits of lung cancer screening. We took the time to pause the power point at intervals to allow for questions to be asked and answered to ensure understanding. We discussed that he had taken the single most powerful action possible to decrease his risk of developing lung cancer when he quit smoking. I counseled him to remain smoke free, and to contact me if he ever had the desire to smoke again so that I can provide resources and tools to help support the effort to remain smoke free. We discussed the time and location of the scan, and that either  Abigail Miyamoto RN, Karlton Lemon, RN or I  or I will call / send a letter with the results within  24-72 hours of receiving them. He has the office contact information in the event he needs to speak with me,  he verbalized understanding of all of the above and had no further questions upon leaving the office.     I explained to the patient that there  has been a high incidence of coronary artery disease noted on these exams. I explained that this is a non-gated exam therefore degree or severity cannot be  determined. This patient is on statin therapy. I have asked the patient to follow-up with their PCP regarding any incidental finding of coronary artery disease and management with diet or medication as they feel is clinically indicated. The patient verbalized understanding of the above and had no further questions.     Bevelyn Ngo, NP 07/13/2023

## 2023-07-14 ENCOUNTER — Telehealth: Payer: Self-pay

## 2023-07-14 NOTE — Telephone Encounter (Signed)
[facility] states he needs a prior authorization for the [employer]

## 2023-07-14 NOTE — Telephone Encounter (Signed)
Prescription Request  07/14/2023  LOV: Visit date not found  What is the name of the medication or equipment? Ozempic (patient states Trulicity was not working, so Dr. Birdie Sons is supposed to put him back on Ozempic).    Have you contacted your pharmacy to request a refill? Yes   Which pharmacy would you like this sent to?  [facility] [address], [place] - [address] [place] [address] Phone: [phone] Fax: [phone]   Patient notified that their request is being sent to the clinical staff for review and that they should receive a response within 2 business days.   Please advise at Mobile [phone] (mobile)  Patient states he is out of Trulicity and needs to go back on Ozempic.  Patient states Dr. Birdie Sons was trying to get Ozempic approved by insurance.

## 2023-07-14 NOTE — Telephone Encounter (Signed)
Ozempic was sent to his pharmacy on 07/07/23. Please check with the pharmacy to see if the received this refill.

## 2023-07-20 ENCOUNTER — Other Ambulatory Visit (HOSPITAL_COMMUNITY): Payer: Self-pay

## 2023-07-20 NOTE — Telephone Encounter (Signed)
Per test claim, Ozempic is non formulary and not covered, preferred medications include: Saxenda (liraglutide) or Trulicity (dulaglutide)   Please change if appropriate or advise:

## 2023-07-20 NOTE — Telephone Encounter (Signed)
Patient's wife called. Patient is out of medication for 2 weeks. Wife wanted to know what is going on. Please call, her at [phone].

## 2023-07-20 NOTE — Telephone Encounter (Signed)
Not without first trialing Victoza (I put Saxenda previously, Victoza has the same active ingredient but is approved for type 2 DM). Per Ozempic denial from February, pt must try both Victoza AND Trulicity before they will consider covering Ozempic. If you would like to write a letter of medical necessity arguing for Ozempic over Victoza, I'd be happy to submit it along with another PA for Ozempic, since Victoza will not provide adequate blood sugar control. Please let me know how you would like to proceed and I'll do what I can to get coverage for the patient. Thanks

## 2023-07-21 NOTE — Telephone Encounter (Signed)
Left message with a male for the Patient to call our office regarding the message below.

## 2023-07-21 NOTE — Telephone Encounter (Signed)
It looks like his insurance will not cover Ozempic until he has tried Victoza.  This information was relayed to me only yesterday afternoon.  Has he tried Victoza in the past?  I would suggest we go ahead and try Victoza if he has not tried it in the past so that he can go ahead and start on medication for this.  We could also try to submit a letter of medical necessity arguing for Ozempic over Victoza since I think the Ozempic might be more effective than Victoza.  I can send the Victoza in once you speak with him or his wife.

## 2023-07-23 ENCOUNTER — Telehealth: Payer: Self-pay | Admitting: Family Medicine

## 2023-07-23 ENCOUNTER — Ambulatory Visit: Payer: 59 | Admitting: Physician Assistant

## 2023-07-23 ENCOUNTER — Other Ambulatory Visit: Payer: Self-pay | Admitting: Acute Care

## 2023-07-23 DIAGNOSIS — Z87891 Personal history of nicotine dependence: Secondary | ICD-10-CM

## 2023-07-23 DIAGNOSIS — Z122 Encounter for screening for malignant neoplasm of respiratory organs: Secondary | ICD-10-CM

## 2023-07-23 DIAGNOSIS — E785 Hyperlipidemia, unspecified: Secondary | ICD-10-CM

## 2023-07-23 NOTE — Telephone Encounter (Signed)
Attempted to call pt. Not available to take call at this time.

## 2023-07-23 NOTE — Telephone Encounter (Signed)
Pt called back and given results.  Gave him time to ask questions and have those questions answered.  Pt willing to try crestor.

## 2023-07-23 NOTE — Telephone Encounter (Signed)
Please let the patient know that his lung cancer screening test revealed benign appearance of a small lung nodule.  It did reveal some mild centrilobular emphysema as well as two-vessel coronary atherosclerosis and diffuse fatty liver.  All of these things reinforce the need to get his diabetes under good control and have him on a statin to help reduce his risk of stroke and heart attack.  There is a previous message noting that his insurance would not pay for Ozempic though they would cover Victoza.  I can send that in for him and we could try to do a letter of medical necessity for the Ozempic while he tries the Victoza.

## 2023-07-23 NOTE — Telephone Encounter (Signed)
Called Patient to give him results and he wants to holler and cut me off then tell me to talk. When I started talking he just cut me off and try to put words in my mouth. Patient has questions that I can not answer so I asked my RN supervisor to call him to clarify for him. Patient states he is willing to take Crestor but not Victoza the first original drug Dr. Birdie Sons had him on.

## 2023-07-23 NOTE — Telephone Encounter (Signed)
Attempted to call pt, he was unavailable.  Called Melody, spouse who was on DPR.  Melody reports that pt is not willing to trial Victoza, he is very "needle phobic" with this being a daily injection he just will not do it.  Melody is concerned about pts glucose control. Requests that Dr. Birdie Sons write the letter of medical necessity.    Spoke with Dr. Birdie Sons to report pt's inability to do daily injection.  Verbal orders given for Ozempic 0.25mg  1 time per week x 4 weeks and then increase to 0.5mg  1 time per week.  He also ok'd giving pt 2 boxes of sample medications and will write letter of medical necessity.    Called Melody, spouse and let her know that I have 2 boxes of Ozempic samples ready      Medication Samples have been provided to the patient.  Drug name: Semaglutide Ozempic       Strength: 0.25-0.5mg         Qty: 2 boxes  LOT: [id number]  Exp.Date: 11/08/24  Dosing instructions: Inject 0.25mg  into skin 1 time per week x 4 weeks then increase to 0.5mg  1 time per week.   The patient has been instructed regarding the correct time, dose, and frequency of taking this medication, including desired effects and most common side effects.   Valentino Nose 4:02 PM 07/23/2023

## 2023-07-30 MED ORDER — ROSUVASTATIN CALCIUM 20 MG PO TABS
20.0000 mg | ORAL_TABLET | Freq: Every day | ORAL | 3 refills | Status: DC
Start: 2023-07-30 — End: 2023-08-03

## 2023-07-30 NOTE — Addendum Note (Signed)
Addended by: Glori Luis on: 07/30/2023 03:57 PM   Modules accepted: Orders

## 2023-07-30 NOTE — Telephone Encounter (Signed)
Letter of medical necessity written

## 2023-07-30 NOTE — Telephone Encounter (Signed)
Crestor sent to pharmacy.  Patient needs follow-up labs in 6 weeks.  Orders placed.

## 2023-08-02 ENCOUNTER — Telehealth: Payer: Self-pay

## 2023-08-02 NOTE — Telephone Encounter (Signed)
Pharmacy Patient Advocate Encounter   Received notification from Pt Calls Messages that prior authorization for Ozempic (0.25 or 0.5 MG/DOSE) 2MG /3ML pen-injectors is required/requested.   Insurance verification completed.   The patient is insured through [facility] .   Per test claim: PA required; PA submitted to [facility] via CoverMyMeds Key/confirmation #/EOC [facility] Status is pending

## 2023-08-02 NOTE — Telephone Encounter (Signed)
Thank you, starting PA now

## 2023-08-02 NOTE — Telephone Encounter (Signed)
Absolutely, please fax the letter to me at [phone] or attach to pt's media tab and I'll get it submitted for you, thanks!

## 2023-08-03 ENCOUNTER — Ambulatory Visit: Payer: 59 | Admitting: Physician Assistant

## 2023-08-03 ENCOUNTER — Encounter: Payer: Self-pay | Admitting: Physician Assistant

## 2023-08-03 VITALS — BP 136/90 | HR 72 | Ht 74.0 in | Wt 245.0 lb

## 2023-08-03 DIAGNOSIS — R310 Gross hematuria: Secondary | ICD-10-CM | POA: Diagnosis not present

## 2023-08-03 DIAGNOSIS — E349 Endocrine disorder, unspecified: Secondary | ICD-10-CM

## 2023-08-03 DIAGNOSIS — E291 Testicular hypofunction: Secondary | ICD-10-CM

## 2023-08-04 ENCOUNTER — Other Ambulatory Visit: Payer: 59

## 2023-08-04 DIAGNOSIS — E349 Endocrine disorder, unspecified: Secondary | ICD-10-CM | POA: Diagnosis not present

## 2023-08-04 NOTE — Telephone Encounter (Signed)
Pharmacy Patient Advocate Encounter  Received notification from [facility] that Prior Authorization for Ozempic 2mg /47ml has been DENIED.  Full denial letter will be uploaded to the media tab. See denial reason below.   PA #/Case ID/Reference #:  [id number]

## 2023-08-05 LAB — TESTOSTERONE: Testosterone: 118 ng/dL — ABNORMAL LOW (ref 264–916)

## 2023-08-05 MED ORDER — TESTOSTERONE 25 MG/2.5GM (1%) TD GEL
4.0000 | Freq: Every day | TRANSDERMAL | 3 refills | Status: DC
Start: 2023-08-05 — End: 2023-10-14

## 2023-08-05 NOTE — Addendum Note (Signed)
Addended by: Debarah Crape on: 08/05/2023 05:08 PM   Modules accepted: Orders

## 2023-08-05 NOTE — Telephone Encounter (Signed)
Noted. Please let the patient know that his insurance denied the ozempic. We will have to try the victoza and then could consider trying to get approval for the ozempic if he can not tolerate the victoza.

## 2023-08-05 NOTE — Progress Notes (Signed)
08/03/2023 12:55 PM   William Frey 09-14-1971 [id number]  CC: Chief Complaint  Patient presents with   Follow-up   HPI: William Frey is a 52 y.o. male with PMH nephrolithiasis, hypogonadism previously on testosterone gel, ED, and uncontrolled diabetes who presents today to discuss resuming TRT.   Today he reports he ran out of testosterone gel about a year ago.  He was using 4 pumps daily.  He would like to resume testosterone therapy.  He is adamantly opposed to IM injections or pellets.  He is currently adjusting his pharmacotherapy for glycemic control.  He is on Ozempic samples and it is working well for him.  He passed a bloody clump of mucus versus tissue in his urine last week.  He been having some right-sided back pain around that time, however he admits to chronic back pain at baseline and states that this pain was atypical for him to represent a stone episode.  He is a former smoker, though hard to quantify pack years.  He never saw stone pass.  He does have an extensive family history of malignancy with lymphoma in his father, breast and bone in his mother, and lung and his brother.  All 3 are deceased from their malignancies.  PMH: Past Medical History:  Diagnosis Date   Allergy    Anxiety    Arthritis    Asthma    Chicken pox    Complication of anesthesia    difficult to wake up   COVID-19    11/26/20 sp [facility] infusion   Diabetes mellitus without complication (HCC)    Family history of adverse reaction to anesthesia    father had low tolerance for anesthesia   GERD (gastroesophageal reflux disease)    History of kidney stones    Hyperlipidemia    Hypertension    Low testosterone    Migraines     Surgical History: Past Surgical History:  Procedure Laterality Date   CYSTOSCOPY W/ RETROGRADES Right 10/28/2018   Procedure: CYSTOSCOPY WITH RETROGRADE PYELOGRAM;  Surgeon: Sondra Come, MD;  Location: [facility];  Service: Urology;  Laterality:  Right;   CYSTOSCOPY/URETEROSCOPY/HOLMIUM LASER/STENT PLACEMENT Right 10/28/2018   Procedure: CYSTOSCOPY/URETEROSCOPY/HOLMIUM LASER/STENT PLACEMENT;  Surgeon: Sondra Come, MD;  Location: [facility];  Service: Urology;  Laterality: Right;   OSTECTOMY METATARSAL Right 2003    Home Medications:  Allergies as of 08/03/2023       Reactions   Penicillins Hives   Childhood allergy Has patient had a PCN reaction causing immediate rash, facial/tongue/throat swelling, SOB or lightheadedness with hypotension: No Has patient had a PCN reaction causing severe rash involving mucus membranes or skin necrosis: No Has patient had a PCN reaction that required hospitalization: No Has patient had a PCN reaction occurring within the last 10 years: No If all of the above answers are "NO", then may proceed with Cephalosporin use.        Medication List        Accurate as of August 03, 2023 11:59 PM. If you have any questions, ask your nurse or doctor.          STOP taking these medications    albuterol 108 (90 Base) MCG/ACT inhaler Commonly known as: VENTOLIN HFA Stopped by: Carman Ching   diazepam 5 MG tablet Commonly known as: VALIUM Stopped by: Carman Ching   Jardiance 10 MG Tabs tablet Generic drug: empagliflozin Stopped by: Carman Ching   ketoconazole 2 % cream Commonly known as: NIZORAL  Stopped by: Carman Ching   metFORMIN 500 MG 24 hr tablet Commonly known as: GLUCOPHAGE-XR Stopped by: Carman Ching   rosuvastatin 20 MG tablet Commonly known as: Crestor Stopped by: Carman Ching       TAKE these medications    Blood Glucose Monitoring Suppl Devi 1 each by Does not apply route in the morning, at noon, and at bedtime. May substitute to any manufacturer covered by patient's insurance.   BLOOD GLUCOSE TEST STRIPS Strp 1 each by In Vitro route in the morning, at noon, and at bedtime. May substitute to any  manufacturer covered by patient's insurance.   Lancet Device Misc 1 each by Does not apply route in the morning, at noon, and at bedtime. May substitute to any manufacturer covered by patient's insurance.   Lancets Misc. Misc 1 each by Does not apply route in the morning, at noon, and at bedtime. May substitute to any manufacturer covered by patient's insurance.   onetouch ultrasoft lancets Use as instructed   Semaglutide(0.25 or 0.5MG /DOS) 2 MG/3ML Sopn Inject 0.25 mg into the skin once a week for 28 days, THEN 0.5 mg once a week. Start taking on: July 07, 2023   Testosterone 25 MG/2.5GM (1%) Gel Place 1 packet onto the skin daily.        Allergies:  Allergies  Allergen Reactions   Penicillins Hives    Childhood allergy Has patient had a PCN reaction causing immediate rash, facial/tongue/throat swelling, SOB or lightheadedness with hypotension: No Has patient had a PCN reaction causing severe rash involving mucus membranes or skin necrosis: No Has patient had a PCN reaction that required hospitalization: No Has patient had a PCN reaction occurring within the last 10 years: No If all of the above answers are "NO", then may proceed with Cephalosporin use.     Family History: Family History  Problem Relation Age of Onset   Arthritis Mother    Cancer Mother        breast and bone   Heart Problems Mother    Arthritis Father    Lymphoma Father    Heart disease Father    Stroke Father    Hypertension Father    Clotting disorder Father    Cancer Brother        lung   Heart disease Brother        atriall fibrillation   Diabetes Brother    Heart disease Brother        atrial fibrillation   Diabetes Maternal Aunt    Diabetes Maternal Aunt     Social History:   reports that he quit smoking about 9 years ago. His smoking use included cigarettes. He has been exposed to tobacco smoke. He has never used smokeless tobacco. He reports that he does not drink alcohol and  does not use drugs.  Physical Exam: BP (!) 136/90   Pulse 72   Ht 6\' 2"  (1.88 m)   Wt 245 lb (111.1 kg)   BMI 31.46 kg/m   Constitutional:  Alert and oriented, no acute distress, nontoxic appearing HEENT: [place], AT Cardiovascular: No clubbing, cyanosis, or edema Respiratory: Normal respiratory effort, no increased work of breathing Skin: No rashes, bruises or suspicious lesions Neurologic: Grossly intact, no focal deficits, moving all 4 extremities Psychiatric: Normal mood and affect  Assessment & Plan:   1. Testosterone deficiency He has been off TRT for about a year.  Will recheck in a.m. testosterone today and if low, resume testosterone gel.  He  is not particularly interested in considering other formulations, which is reasonable. - Testosterone  2. Gross hematuria Unclear if he was having flank pain associated with this episode.  We discussed that there are multiple things that can cause blood in the urine including infection, stones, cysts, anticoagulation, BPH, and urologic malignancies.  Will pursue CT urogram and likely cystoscopy based on results. - CT HEMATURIA WORKUP; Future  Return for Will call with results.  Carman Ching, PA-C  [facility] [place] [address], [address] [place], [place] [address] [phone]

## 2023-08-13 NOTE — Telephone Encounter (Signed)
Called Patient again and they say he is tied up but will call back.

## 2023-08-18 NOTE — Telephone Encounter (Signed)
error 

## 2023-08-19 NOTE — Telephone Encounter (Signed)
Attempted to call Patient no answer & voicemail was full.

## 2023-08-20 NOTE — Telephone Encounter (Signed)
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 Purvis Sheffield office back.

## 2023-08-24 NOTE — Telephone Encounter (Signed)
Called the Patient's wife since he does not answer his phone or call us back.

## 2023-08-25 MED ORDER — LIRAGLUTIDE 18 MG/3ML ~~LOC~~ SOPN
PEN_INJECTOR | SUBCUTANEOUS | 1 refills | Status: DC
Start: 2023-08-25 — End: 2023-09-13

## 2023-08-25 NOTE — Telephone Encounter (Signed)
Pt wife called back and she want the provider to send in the victoza

## 2023-08-25 NOTE — Addendum Note (Signed)
Addended by: Glori Luis on: 08/25/2023 04:12 PM   Modules accepted: Orders

## 2023-08-25 NOTE — Telephone Encounter (Signed)
Sent to pharmacy 

## 2023-09-09 ENCOUNTER — Other Ambulatory Visit (HOSPITAL_COMMUNITY): Payer: Self-pay

## 2023-09-09 ENCOUNTER — Telehealth: Payer: Self-pay | Admitting: Pharmacy Technician

## 2023-09-09 NOTE — Telephone Encounter (Signed)
Pharmacy Patient Advocate Encounter   Received notification from CoverMyMeds that prior authorization for Victoza 18MG /3ML pen-injectors is required/requested.   Insurance verification completed.   The patient is insured through [facility] .   Per test claim: PA required; PA submitted to above mentioned insurance via CoverMyMeds Key/confirmation #/EOC [id number] Status is pending

## 2023-09-10 ENCOUNTER — Other Ambulatory Visit (HOSPITAL_COMMUNITY): Payer: Self-pay

## 2023-09-10 NOTE — Telephone Encounter (Signed)
Clinical questions answered. PA submitted

## 2023-09-10 NOTE — Telephone Encounter (Signed)
Pharmacy Patient Advocate Encounter  Received notification from [employer] [facility] that Prior Authorization for VICTOZA has been APPROVED from 09/10/23 to 09/09/24   PA #/Case ID/Reference #: [id number]

## 2023-09-12 ENCOUNTER — Encounter: Payer: Self-pay | Admitting: Family Medicine

## 2023-09-12 DIAGNOSIS — E349 Endocrine disorder, unspecified: Secondary | ICD-10-CM

## 2023-09-12 DIAGNOSIS — R310 Gross hematuria: Secondary | ICD-10-CM

## 2023-09-13 ENCOUNTER — Other Ambulatory Visit: Payer: Self-pay

## 2023-09-13 ENCOUNTER — Ambulatory Visit: Payer: 59 | Admitting: Dermatology

## 2023-09-13 MED ORDER — LIRAGLUTIDE 18 MG/3ML ~~LOC~~ SOPN: PEN_INJECTOR | SUBCUTANEOUS | 1 refills | Status: DC

## 2023-09-13 NOTE — Telephone Encounter (Signed)
Patient is aware and medication has been resent to [facility].

## 2023-09-14 NOTE — Telephone Encounter (Signed)
Patient's wife is aware and the medication was sent to the Pharmacy.

## 2023-10-07 ENCOUNTER — Emergency Department
Admission: EM | Admit: 2023-10-07 | Discharge: 2023-10-07 | Disposition: A | Discharge status: Home / Self Care | Payer: 59 | Attending: Emergency Medicine | Admitting: Emergency Medicine

## 2023-10-07 ENCOUNTER — Emergency Department: Payer: 59

## 2023-10-07 ENCOUNTER — Other Ambulatory Visit: Payer: Self-pay

## 2023-10-07 DIAGNOSIS — S301XXA Contusion of abdominal wall, initial encounter: Secondary | ICD-10-CM | POA: Insufficient documentation

## 2023-10-07 DIAGNOSIS — I10 Essential (primary) hypertension: Secondary | ICD-10-CM | POA: Diagnosis not present

## 2023-10-07 DIAGNOSIS — E119 Type 2 diabetes mellitus without complications: Secondary | ICD-10-CM | POA: Insufficient documentation

## 2023-10-07 DIAGNOSIS — S3993XA Unspecified injury of pelvis, initial encounter: Secondary | ICD-10-CM | POA: Diagnosis not present

## 2023-10-07 DIAGNOSIS — S3991XA Unspecified injury of abdomen, initial encounter: Secondary | ICD-10-CM | POA: Diagnosis not present

## 2023-10-07 DIAGNOSIS — Y92410 Unspecified street and highway as the place of occurrence of the external cause: Secondary | ICD-10-CM | POA: Diagnosis not present

## 2023-10-07 DIAGNOSIS — S0990XA Unspecified injury of head, initial encounter: Secondary | ICD-10-CM | POA: Diagnosis not present

## 2023-10-07 DIAGNOSIS — S199XXA Unspecified injury of neck, initial encounter: Secondary | ICD-10-CM | POA: Diagnosis not present

## 2023-10-07 DIAGNOSIS — R079 Chest pain, unspecified: Secondary | ICD-10-CM | POA: Diagnosis not present

## 2023-10-07 DIAGNOSIS — S299XXA Unspecified injury of thorax, initial encounter: Secondary | ICD-10-CM | POA: Diagnosis not present

## 2023-10-07 LAB — CBC WITH DIFFERENTIAL/PLATELET
Abs Immature Granulocytes: 0.02 10*3/uL (ref 0.00–0.07)
Basophils Absolute: 0.1 10*3/uL (ref 0.0–0.1)
Basophils Relative: 1 %
Eosinophils Absolute: 0.1 10*3/uL (ref 0.0–0.5)
Eosinophils Relative: 2 %
HCT: 44.8 % (ref 39.0–52.0)
Hemoglobin: 15 g/dL (ref 13.0–17.0)
Immature Granulocytes: 0 %
Lymphocytes Relative: 45 %
Lymphs Abs: 3.6 10*3/uL (ref 0.7–4.0)
MCH: 30.2 pg (ref 26.0–34.0)
MCHC: 33.5 g/dL (ref 30.0–36.0)
MCV: 90.1 fL (ref 80.0–100.0)
Monocytes Absolute: 0.6 10*3/uL (ref 0.1–1.0)
Monocytes Relative: 8 %
Neutro Abs: 3.5 10*3/uL (ref 1.7–7.7)
Neutrophils Relative %: 44 %
Platelets: 203 10*3/uL (ref 150–400)
RBC: 4.97 MIL/uL (ref 4.22–5.81)
RDW: 11.7 % (ref 11.5–15.5)
WBC: 7.9 10*3/uL (ref 4.0–10.5)
nRBC: 0 % (ref 0.0–0.2)

## 2023-10-07 LAB — COMPREHENSIVE METABOLIC PANEL
ALT: 18 U/L (ref 0–44)
AST: 16 U/L (ref 15–41)
Albumin: 4 g/dL (ref 3.5–5.0)
Alkaline Phosphatase: 58 U/L (ref 38–126)
Anion gap: 9 (ref 5–15)
BUN: 20 mg/dL (ref 6–20)
CO2: 21 mmol/L — ABNORMAL LOW (ref 22–32)
Calcium: 9.1 mg/dL (ref 8.9–10.3)
Chloride: 103 mmol/L (ref 98–111)
Creatinine, Ser: 1.21 mg/dL (ref 0.61–1.24)
GFR, Estimated: >60 mL/min (ref >60)
Glucose, Bld: 243 mg/dL — ABNORMAL HIGH (ref 70–99)
Potassium: 4 mmol/L (ref 3.5–5.1)
Sodium: 133 mmol/L — ABNORMAL LOW (ref 135–145)
Total Bilirubin: 0.5 mg/dL (ref <1.2)
Total Protein: 7.5 g/dL (ref 6.5–8.1)

## 2023-10-07 LAB — TROPONIN I (HIGH SENSITIVITY)
Troponin I (High Sensitivity): 2 ng/L (ref <18)
Troponin I (High Sensitivity): 3 ng/L (ref <18)

## 2023-10-07 LAB — LIPASE, BLOOD: Lipase: 43 U/L (ref 11–51)

## 2023-10-07 MED ORDER — HYDROMORPHONE HCL 1 MG/ML IJ SOLN
1.0000 mg | Freq: Once | INTRAMUSCULAR | Status: AC
  Administered 2023-10-07: 1 mg via INTRAVENOUS
  Filled 2023-10-07: qty 1

## 2023-10-07 MED ORDER — HYDROMORPHONE HCL 1 MG/ML IJ SOLN
0.5000 mg | Freq: Once | INTRAMUSCULAR | Status: AC
  Administered 2023-10-07: 0.5 mg via INTRAVENOUS
  Filled 2023-10-07: qty 0.5

## 2023-10-07 MED ORDER — OXYCODONE-ACETAMINOPHEN 5-325 MG PO TABS: 1.0000 | ORAL_TABLET | ORAL | 0 refills | Status: DC | PRN

## 2023-10-07 MED ORDER — IOHEXOL 300 MG/ML  SOLN
100.0000 mL | Freq: Once | INTRAMUSCULAR | Status: AC | PRN
  Administered 2023-10-07: 100 mL via INTRAVENOUS

## 2023-10-07 MED ORDER — ONDANSETRON HCL 4 MG/2ML IJ SOLN
4.0000 mg | Freq: Once | INTRAMUSCULAR | Status: AC
  Administered 2023-10-07: 4 mg via INTRAVENOUS
  Filled 2023-10-07: qty 2
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
## 2023-10-13 ENCOUNTER — Telehealth: Payer: Self-pay

## 2023-10-13 NOTE — Telephone Encounter (Signed)
Patient request andogrel pump not the packets.  He requests we send that to the pharmacy and let them process it and if it requires prior auth he would like [place] to initiate it.  Patient refused to pick up the packets and has not had this medication in 3 months.

## 2023-10-14 MED ORDER — TESTOSTERONE 1.62 % TD GEL: 4.0000 | Freq: Every day | TRANSDERMAL | 0 refills | Status: DC

## 2023-10-14 NOTE — Telephone Encounter (Signed)
Addressed - see MyChart message

## 2023-10-20 ENCOUNTER — Encounter: Payer: Self-pay | Admitting: Family Medicine

## 2023-10-20 MED ORDER — SEMAGLUTIDE(0.25 OR 0.5MG/DOS) 2 MG/3ML ~~LOC~~ SOPN: PEN_INJECTOR | SUBCUTANEOUS | 1 refills | Status: DC

## 2023-10-20 NOTE — Telephone Encounter (Signed)
Please send Ozempic given.  Patient intolerant of Victoza with headaches and dizziness since starting this.  We will try to get the Ozempic approved.

## 2023-10-26 ENCOUNTER — Ambulatory Visit
Admission: RE | Admit: 2023-10-26 | Discharge: 2023-10-26 | Disposition: A | Discharge status: Home / Self Care | Payer: 59 | Source: Ambulatory Visit | Attending: Physician Assistant | Admitting: Physician Assistant

## 2023-10-26 DIAGNOSIS — R310 Gross hematuria: Secondary | ICD-10-CM | POA: Diagnosis not present

## 2023-10-26 DIAGNOSIS — N281 Cyst of kidney, acquired: Secondary | ICD-10-CM | POA: Diagnosis not present

## 2023-10-26 DIAGNOSIS — K449 Diaphragmatic hernia without obstruction or gangrene: Secondary | ICD-10-CM | POA: Diagnosis not present

## 2023-10-26 DIAGNOSIS — K5710 Diverticulosis of small intestine without perforation or abscess without bleeding: Secondary | ICD-10-CM | POA: Diagnosis not present

## 2023-10-26 DIAGNOSIS — N200 Calculus of kidney: Secondary | ICD-10-CM | POA: Diagnosis not present

## 2023-10-26 MED ORDER — IOHEXOL 300 MG/ML  SOLN
125.0000 mL | Freq: Once | INTRAMUSCULAR | Status: AC | PRN
  Administered 2023-10-26: 125 mL via INTRAVENOUS

## 2023-10-26 MED ORDER — SODIUM CHLORIDE 0.9 % IV BOLUS
250.0000 mL | Freq: Once | INTRAVENOUS | Status: AC
  Administered 2023-10-26: 250 mL via INTRAVENOUS

## 2023-11-09 ENCOUNTER — Ambulatory Visit: Payer: Self-pay | Admitting: Family Medicine

## 2023-11-09 NOTE — Telephone Encounter (Signed)
  Chief Complaint: congestion pt states he feels like he is drowning from congestion Symptoms: nasal head and chest congestion with no relief only from hot shower, cough non-productive, dry cough Frequency: Sunday Pertinent Negatives: Patient denies fever Disposition: [] ED /[x] Urgent Care (no appt availability in office) / [] Appointment(In office/virtual)/ []  [facility]/ [] Home Care/ [] Refused Recommended Disposition /[] [facility]/ []  Follow-up with PCP Additional Notes: nurse recommenced pt go to urgent care or ED due to no appts available: pt stated understanding Reason for Disposition  Fever present > 3 days (72 hours)  [1] Sinus congestion (pressure, fullness) AND [2] present > 10 days  Answer Assessment - Initial Assessment Questions 1. ONSET: When did the nasal discharge start?      Sunday 2. AMOUNT: How much discharge is there?      Congestion in head, chest  3. COUGH: Do you have a cough? If Yes, ask: Describe the color of your sputum (clear, white, yellow, green)     Nonproductive cough - nasal congestion comes out after hot shower - feels like drowning lay and talking 4. RESPIRATORY DISTRESS: Describe your breathing.      SOB 5. FEVER: Do you have a fever? If Yes, ask: What is your temperature, how was it measured, and when did it start?     no 6. SEVERITY: Overall, how bad are you feeling right now? (e.g., doesn't interfere with normal activities, staying home from school/work, staying in bed)      Dry throat - and dry cough at times 7. OTHER SYMPTOMS: Do you have any other symptoms? (e.g., sore throat, earache, wheezing, vomiting)     no 8. PREGNANCY: Is there any chance you are pregnant? When was your last menstrual period?     no  Protocols used: Common Cold-A-AH

## 2023-11-11 ENCOUNTER — Ambulatory Visit
Admission: EM | Admit: 2023-11-11 | Discharge: 2023-11-11 | Disposition: A | Discharge status: Home / Self Care | Payer: 59 | Attending: Emergency Medicine | Admitting: Emergency Medicine

## 2023-11-11 DIAGNOSIS — J069 Acute upper respiratory infection, unspecified: Secondary | ICD-10-CM | POA: Diagnosis not present

## 2023-11-11 DIAGNOSIS — J45901 Unspecified asthma with (acute) exacerbation: Secondary | ICD-10-CM | POA: Diagnosis not present

## 2023-11-11 MED ORDER — PREDNISONE 10 MG PO TABS: 40.0000 mg | ORAL_TABLET | Freq: Every day | ORAL | 0 refills | Status: AC

## 2023-11-11 MED ORDER — ALBUTEROL SULFATE HFA 108 (90 BASE) MCG/ACT IN AERS: 1.0000 | INHALATION_SPRAY | Freq: Four times a day (QID) | RESPIRATORY_TRACT | 0 refills | Status: AC | PRN

## 2023-11-11 MED ORDER — AZITHROMYCIN 250 MG PO TABS: 250.0000 mg | ORAL_TABLET | Freq: Every day | ORAL | 0 refills | Status: DC

## 2023-11-11 NOTE — ED Triage Notes (Addendum)
 Patient to Urgent Care with complaints of nasal congestion/ dry throat/ sinus pain and congestion/ post nasal drip. Denies any known fevers. Weakness/ fatigue/ body aches. Describes that his lungs are full.  Symptoms started Sunday. Concerned about pneumonia.  Meds: dayquil/ nyquil. No meds today.

## 2023-11-11 NOTE — ED Provider Notes (Signed)
 [name]    CSN: [id number] Arrival date & time: 11/11/23  1153      History   Chief Complaint Chief Complaint  Patient presents with   Nasal Congestion    HPI William Frey is a 53 y.o. male.  Patient presents with 5-day history of congestion, postnasal drip, runny nose, sinus pressure, cough.  No fever or shortness of breath.  No OTC medication taken today but previously treating with DayQuil and NyQuil.  Patient reports history of asthma but does not currently have an inhaler.   The history is provided by the patient and medical records.    Past Medical History:  Diagnosis Date   Allergy     Anxiety    Arthritis    Asthma    Chicken pox    Complication of anesthesia    difficult to wake up   COVID-19    11/26/20 sp [facility] infusion   Diabetes mellitus without complication (HCC)    Family history of adverse reaction to anesthesia    father had low tolerance for anesthesia   GERD (gastroesophageal reflux disease)    History of kidney stones    Hyperlipidemia    Hypertension    Low testosterone     Migraines     Patient Active Problem List   Diagnosis Date Noted   Former smoker 04/06/2023   Stress 04/06/2023   Scalp lesion 11/12/2022   Arthritis of right hip 07/20/2022   Strain of muscle of right hip 07/20/2022   Trochanteric bursitis of right hip 07/20/2022   Pain in both feet 06/01/2022   History of COVID-19 02/10/2021   Insomnia 12/27/2020   Panic attack 12/27/2020   Left flank pain 10/28/2020   Vitamin D  deficiency 10/28/2020   Detachment of glenoid labrum 07/05/2020   Lumbar sprain 07/05/2020   Neck sprain 07/05/2020   Neuropathy 06/28/2020   Hypogonadism in male 06/28/2020   Nephrolithiasis 11/12/2018   Proteinuria 11/12/2018   Screen for STD (sexually transmitted disease) 05/04/2018   Anxiety and depression 11/30/2017   Angiokeratoma of [place] 12/24/2016   Chest pain 11/15/2016   Essential hypertension 11/15/2016   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   Hyperlipidemia 12/23/2015   History of nephrolithiasis 11/03/2015   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   Reduced libido 10/31/2015   Type 2 diabetes mellitus with hyperglycemia, without long-term current use of insulin (HCC) 09/23/2015   Low testosterone  09/23/2015   Preventative health care 09/18/2015   Obesity (BMI 30-39.9) 09/18/2015   Elevated blood-pressure reading without diagnosis of hypertension 09/18/2015    Past Surgical History:  Procedure Laterality Date   CYSTOSCOPY W/ RETROGRADES Right 10/28/2018   Procedure: CYSTOSCOPY WITH RETROGRADE PYELOGRAM;  Surgeon: Francisca Redell BROCKS, MD;  Location: [facility];  Service: Urology;  Laterality: Right;   CYSTOSCOPY/URETEROSCOPY/HOLMIUM LASER/STENT PLACEMENT Right 10/28/2018   Procedure: CYSTOSCOPY/URETEROSCOPY/HOLMIUM LASER/STENT PLACEMENT;  Surgeon: Francisca Redell BROCKS, MD;  Location: [facility];  Service: Urology;  Laterality: Right;   OSTECTOMY METATARSAL Right 2003       Home Medications    Prior to Admission medications   Medication Sig Start Date End Date Taking? Authorizing Provider  albuterol  (VENTOLIN  HFA) 108 (90 Base) MCG/ACT inhaler Inhale 1-2 puffs into the lungs every 6 (six) hours as needed. 11/11/23  Yes Corlis Burnard DEL, NP  azithromycin  (ZITHROMAX ) 250 MG tablet Take 1 tablet (250 mg total) by mouth daily. Take first 2 tablets together, then 1 every day until finished. 11/11/23  Yes Corlis,  Burnard DEL, NP  predniSONE  (DELTASONE ) 10 MG tablet Take 4 tablets (40 mg total) by mouth daily for 5 days. 11/11/23 11/16/23 Yes Corlis Burnard DEL, NP  Blood Glucose Monitoring Suppl DEVI 1 each by Does not apply route in the morning, at noon, and at bedtime. May substitute to any manufacturer covered by patient's insurance. 04/06/23   Maribeth Camellia MATSU, MD  Glucose Blood (BLOOD GLUCOSE TEST STRIPS) STRP 1 each by In Vitro route in the morning, at noon, and at bedtime. May substitute to any  manufacturer covered by patient's insurance. 04/06/23   Maribeth Camellia MATSU, MD  Lancet Device MISC 1 each by Does not apply route in the morning, at noon, and at bedtime. May substitute to any manufacturer covered by patient's insurance. 04/06/23   Maribeth Camellia MATSU, MD  Lancets [facility] ULTRASOFT) lancets Use as instructed 09/23/15   Cook, Jayce G, DO  Lancets Misc. MISC 1 each by Does not apply route in the morning, at noon, and at bedtime. May substitute to any manufacturer covered by patient's insurance. 04/06/23   Maribeth Camellia MATSU, MD  oxyCODONE -acetaminophen  (PERCOCET) 5-325 MG tablet Take 1 tablet by mouth every 4 (four) hours as needed for severe pain (pain score 7-10). 10/07/23 10/06/24  Willo Dunnings, MD  Semaglutide ,0.25 or 0.5MG /DOS, 2 MG/3ML SOPN Inject 0.25 mg into the skin once a week for 28 days, THEN 0.5 mg once a week. 10/20/23 02/15/24  Maribeth Camellia MATSU, MD  Testosterone  1.62 % GEL Place 4 Pump onto the skin daily. 10/14/23   Maurine Lukes, PA-C    Family History Family History  Problem Relation Age of Onset   Arthritis Mother    Cancer Mother        breast and bone   Heart Problems Mother    Arthritis Father    Lymphoma Father    Heart disease Father    Stroke Father    Hypertension Father    Clotting disorder Father    Cancer Brother        lung   Heart disease Brother        atriall fibrillation   Diabetes Brother    Heart disease Brother        atrial fibrillation   Diabetes Maternal Aunt    Diabetes Maternal Aunt     Social History Social History   Tobacco Use   Smoking status: Former    Current packs/day: 0.00    Types: Cigarettes    Quit date: 09/17/2013    Years since quitting: 10.1    Passive exposure: Past   Smokeless tobacco: Never   Tobacco comments:    Patient smoked on and off from 1989 to 2015. Patient has smoked a total of 14 years and averaged 1.5 packs per day = 21 pack year history.  Vaping Use   Vaping status: Never Used   Substance Use Topics   Alcohol use: No    Alcohol/week: 0.0 standard drinks of alcohol    Comment: former   Drug use: No     Allergies   Penicillins   Review of Systems Review of Systems  Constitutional:  Negative for chills and fever.  HENT:  Positive for congestion, postnasal drip, rhinorrhea and sinus pressure. Negative for ear pain and sore throat.   Respiratory:  Positive for cough. Negative for shortness of breath.      Physical Exam Triage Vital Signs ED Triage Vitals [11/11/23 1252]  Encounter Vitals Group     BP  Systolic BP Percentile      Diastolic BP Percentile      Pulse Rate 86     Resp 18     Temp 98.3 F (36.8 C)     Temp src      SpO2 96 %     Weight      Height      Head Circumference      Peak Flow      Pain Score      Pain Loc      Pain Education      Exclude from Growth Chart    No data found.  Updated Vital Signs BP 122/80   Pulse 86   Temp 98.3 F (36.8 C)   Resp 18   SpO2 96%   Visual Acuity Right Eye Distance:   Left Eye Distance:   Bilateral Distance:    Right Eye Near:   Left Eye Near:    Bilateral Near:     Physical Exam Constitutional:      General: He is not in acute distress. HENT:     Right Ear: Tympanic membrane normal.     Left Ear: Tympanic membrane normal.     Nose: Congestion and rhinorrhea present.     Mouth/Throat:     Mouth: Mucous membranes are moist.     Pharynx: Oropharynx is clear.  Cardiovascular:     Rate and Rhythm: Normal rate and regular rhythm.     Heart sounds: Normal heart sounds.  Pulmonary:     Effort: Pulmonary effort is normal. No respiratory distress.     Breath sounds: Normal breath sounds.  Neurological:     Mental Status: He is alert.      UC Treatments / Results  Labs (all labs ordered are listed, but only abnormal results are displayed) Labs Reviewed - No data to display  EKG   Radiology No results found.  Procedures Procedures (including critical care  time)  Medications Ordered in UC Medications - No data to display  Initial Impression / Assessment and Plan / UC Course  I have reviewed the triage vital signs and the nursing notes.  Pertinent labs & imaging results that were available during my care of the patient were reviewed by me and considered in my medical decision making (see chart for details).    Asthma exacerbation, acute upper respiratory infection.  Afebrile and vital signs are stable.  Lungs are clear at this time and O2 sat is 96% on room air.  Treating with albuterol  inhaler, prednisone , Zithromax .  Patient has an appointment already scheduled with his PCP tomorrow.  ED precautions discussed.  Education provided on asthma and URI.  He agrees to plan of care.  Final Clinical Impressions(s) / UC Diagnoses   Final diagnoses:  Asthma with acute exacerbation, unspecified asthma severity, unspecified whether persistent  Acute upper respiratory infection     Discharge Instructions      Use the albuterol  inhaler as directed.  Take the Zithromax  and prednisone  as directed.  Follow up with your primary care provider tomorrow.  Go to the emergency department if you have worsening symptoms.        ED Prescriptions     Medication Sig Dispense Auth. Provider   albuterol  (VENTOLIN  HFA) 108 (90 Base) MCG/ACT inhaler Inhale 1-2 puffs into the lungs every 6 (six) hours as needed. 18 g Corlis Burnard DEL, NP   predniSONE  (DELTASONE ) 10 MG tablet Take 4 tablets (40 mg total) by mouth  daily for 5 days. 20 tablet Corlis Burnard DEL, NP   azithromycin  (ZITHROMAX ) 250 MG tablet Take 1 tablet (250 mg total) by mouth daily. Take first 2 tablets together, then 1 every day until finished. 6 tablet Corlis Burnard DEL, NP      PDMP not reviewed this encounter.   Corlis Burnard DEL, NP 11/11/23 1336

## 2023-11-11 NOTE — Discharge Instructions (Addendum)
 Use the albuterol inhaler as directed.  Take the Zithromax and prednisone as directed.  Follow up with your primary care provider tomorrow.  Go to the emergency department if you have worsening symptoms.

## 2023-11-12 ENCOUNTER — Ambulatory Visit: Payer: 59 | Admitting: Family Medicine

## 2023-11-15 ENCOUNTER — Ambulatory Visit: Payer: Self-pay | Admitting: Family Medicine

## 2023-11-15 ENCOUNTER — Telehealth: Payer: Self-pay

## 2023-11-15 ENCOUNTER — Ambulatory Visit: Payer: 59 | Admitting: Family

## 2023-11-15 NOTE — Telephone Encounter (Signed)
 Spoke to pt and rescheduled him for 11/16/2023

## 2023-11-15 NOTE — Telephone Encounter (Signed)
   Chief Complaint: blood sugar problems Symptoms: high blood sugar readings, excessive thirst, excessive urination Pertinent Negatives: Patient denies dizziness, vomiting, weakness Disposition: [] ED /[] Urgent Care (no appt availability in office) / [x] Appointment(In office/virtual)/ []  [facility]/ [] Home Care/ [] Refused Recommended Disposition /[] [place] [facility]/ []  Follow-up with PCP Additional Notes: Patient's wife called in reporting that patient has been having intermittent episodes of high blood sugar recently. Patient is a type 2 diabetic who has not been taking any medication for his diabetes for the past month due to insurance issues. Patient was previously well managed on ozempic . Wife states patient typically runs from 120-170 and this morning was at 244. Wife reports patients sugar did get up to 488 last night due to patient being prescribed a steroid for a sinus infection but did drop by this morning.  Wife reports patients only symptoms at this time are excessive thirst and excessive urination. Patient's wife requesting in office appt to discuss other treatment options since their insurance is no longer covering patient's ozempic . Appt scheduled 1/6. Wife advised to call back with worsening symptoms before appt. Wife verbalized understanding.    Copied from CRM [id number]. Topic: Clinical - Red Word Triage >> Nov 15, 2023  8:19 AM [facility] ORN wrote: [facility] that prompted transfer to Nurse Triage: was treated for upper respiratory infection, was Rx for prednisone , it made his sugar high 488. down to 240. pt has not been on diabetic rx due to insurance reasons. wanting to be monitored Reason for Disposition  [1] Blood glucose 240 - 300 mg/dL (86.6 - 83.2 mmol/L) AND [2] does not  use insulin (e.g., not insulin-dependent; most people with type 2 diabetes)  Answer Assessment - Initial Assessment Questions 1. BLOOD GLUCOSE: What is your blood glucose level?      488 2.  ONSET: When did you check the blood glucose?     Last night, dropped to 244 this morning 3. USUAL RANGE: What is your glucose level usually? (e.g., usual fasting morning value, usual evening value)     120-170 4. KETONES: Do you check for ketones (urine or blood test strips)? If Yes, ask: What does the test show now?      none 5. TYPE 1 or 2:  Do you know what type of diabetes you have?  (e.g., Type 1, Type 2, Gestational; doesn't know)      Type 2 6. INSULIN: Do you take insulin? What type of insulin(s) do you use? What is the mode of delivery? (syringe, pen; injection or pump)?      none 7. DIABETES PILLS: Do you take any pills for your diabetes? If Yes, ask: Have you missed taking any pills recently?     none 8. OTHER SYMPTOMS: Do you have any symptoms? (e.g., fever, frequent urination, difficulty breathing, dizziness, weakness, vomiting)     Excessive thirst, frequent urination, thinks taking prednisone  is making it higher  Protocols used: Diabetes - High Blood Sugar-A-AH

## 2023-11-15 NOTE — Telephone Encounter (Signed)
Noted. Plan to see as scheduled.  

## 2023-11-16 ENCOUNTER — Ambulatory Visit: Payer: 59 | Admitting: Family Medicine

## 2023-11-16 ENCOUNTER — Encounter: Payer: Self-pay | Admitting: Family Medicine

## 2023-11-16 ENCOUNTER — Telehealth: Payer: Self-pay | Admitting: Family Medicine

## 2023-11-16 VITALS — BP 124/88 | HR 71 | Temp 97.7°F | Ht 74.0 in | Wt 237.4 lb

## 2023-11-16 DIAGNOSIS — E1165 Type 2 diabetes mellitus with hyperglycemia: Secondary | ICD-10-CM | POA: Diagnosis not present

## 2023-11-16 DIAGNOSIS — Z7985 Long-term (current) use of injectable non-insulin antidiabetic drugs: Secondary | ICD-10-CM

## 2023-11-16 DIAGNOSIS — J989 Respiratory disorder, unspecified: Secondary | ICD-10-CM

## 2023-11-16 LAB — POCT GLYCOSYLATED HEMOGLOBIN (HGB A1C)
HbA1c POC (<> result, manual entry): 10.3 % (ref 4.0–5.6)
HbA1c, POC (controlled diabetic range): 10.3 % — AB (ref 0.0–7.0)
HbA1c, POC (prediabetic range): 10.3 % — AB (ref 5.7–6.4)
Hemoglobin A1C: 10.3 % — AB (ref 4.0–5.6)

## 2023-11-16 LAB — MICROALBUMIN / CREATININE URINE RATIO
Creatinine,U: 149.8 mg/dL
Microalb Creat Ratio: 1.6 mg/g (ref 0.0–30.0)
Microalb, Ur: 2.4 mg/dL — ABNORMAL HIGH (ref 0.0–1.9)

## 2023-11-16 MED ORDER — SEMAGLUTIDE(0.25 OR 0.5MG/DOS) 2 MG/3ML ~~LOC~~ SOPN: 0.5000 mg | PEN_INJECTOR | SUBCUTANEOUS | 1 refills | Status: DC

## 2023-11-16 NOTE — Telephone Encounter (Signed)
 Can we start a prior authorization on this patient's Ozempic?  He has tried Trulicity and Victoza with either side effects or not enough benefit for his glucose.  Thanks.

## 2023-11-16 NOTE — Patient Instructions (Signed)
 Nice to see you. We will try to get a PA on the ozempic for you. Please restart the ozempic at 0.5 mg weekly. If you have any nausea or abdominal pain with this dose please contact us right away.

## 2023-11-16 NOTE — Assessment & Plan Note (Addendum)
 Chronic issue with exacerbation of sugars.  Poorly controlled.  He will start back on Ozempic  0.5 mg weekly.  We did not have any samples to give him though if we get some in in the near future we will let him know.  I will send a message to the prior authorization team for them to proceed with prior authorization for Ozempic .  It looks like we never received a prior authorization request from the pharmacy in December when Ozempic  was last sent in.  Advised patient to contact us  towards the end of the week if he has not heard from us  regarding this.

## 2023-11-16 NOTE — Assessment & Plan Note (Signed)
 Improving.  Discussed cough could take 3 to 4 weeks to fully resolve.  He will monitor his symptoms.

## 2023-11-16 NOTE — Progress Notes (Signed)
 Camellia Her, MD Phone: [phone]  William Frey is a 53 y.o. male who presents today for follow-up.  Diabetes: Patient reports sugars have been 200s-400s.  Typically was lower than that though he is recently been sick and on prednisone .  He has not been on medication for some time.  He said issues getting his Ozempic  approved since it was sent in last month.  He was previously on Trulicity  though stopped this related to bruising in his skin and skin issues after injection.  Also was on Victoza  though he stopped that given that it did not seem to work as well for his sugar and per prior MyChart message it was giving him headaches and dizziness.  Patient notes he has 1 dose of Ozempic  0.5 mg at home.  Patient notes the pharmacy advised him that he needed a prior authorization for the Ozempic  and that they never received a response.  Respiratory illness: Patient reports he was treated with a Z-Pak and prednisone .  Notes symptoms have been improving.  Social History   Tobacco Use  Smoking Status Former   Current packs/day: 0.00   Types: Cigarettes   Quit date: 09/17/2013   Years since quitting: 10.1   Passive exposure: Past  Smokeless Tobacco Never  Tobacco Comments   Patient smoked on and off from 1989 to 2015. Patient has smoked a total of 14 years and averaged 1.5 packs per day = 21 pack year history.    Current Outpatient Medications on File Prior to Visit  Medication Sig Dispense Refill   albuterol  (VENTOLIN  HFA) 108 (90 Base) MCG/ACT inhaler Inhale 1-2 puffs into the lungs every 6 (six) hours as needed. 18 g 0   azithromycin  (ZITHROMAX ) 250 MG tablet Take 1 tablet (250 mg total) by mouth daily. Take first 2 tablets together, then 1 every day until finished. 6 tablet 0   Blood Glucose Monitoring Suppl DEVI 1 each by Does not apply route in the morning, at noon, and at bedtime. May substitute to any manufacturer covered by patient's insurance. 1 each 0   Glucose Blood (BLOOD  GLUCOSE TEST STRIPS) STRP 1 each by In Vitro route in the morning, at noon, and at bedtime. May substitute to any manufacturer covered by patient's insurance. 100 strip 1   Lancet Device MISC 1 each by Does not apply route in the morning, at noon, and at bedtime. May substitute to any manufacturer covered by patient's insurance. 1 each 0   Lancets (ONETOUCH ULTRASOFT) lancets Use as instructed 100 each 12   Lancets Misc. MISC 1 each by Does not apply route in the morning, at noon, and at bedtime. May substitute to any manufacturer covered by patient's insurance. 100 each 2   oxyCODONE -acetaminophen  (PERCOCET) 5-325 MG tablet Take 1 tablet by mouth every 4 (four) hours as needed for severe pain (pain score 7-10). 12 tablet 0   predniSONE  (DELTASONE ) 10 MG tablet Take 4 tablets (40 mg total) by mouth daily for 5 days. 20 tablet 0   Testosterone  1.62 % GEL Place 4 Pump onto the skin daily. 75 g 0   No current facility-administered medications on file prior to visit.     ROS see history of present illness  Objective  Physical Exam Vitals:   11/16/23 0811  BP: 124/88  Pulse: 71  Temp: 97.7 F (36.5 C)  SpO2: 95%    BP Readings from Last 3 Encounters:  11/16/23 124/88  11/11/23 122/80  10/07/23 128/76   Wt Readings from  Last 3 Encounters:  11/16/23 237 lb 6.4 oz (107.7 kg)  10/07/23 245 lb (111.1 kg)  08/03/23 245 lb (111.1 kg)    Physical Exam Constitutional:      General: He is not in acute distress.    Appearance: He is not diaphoretic.  Cardiovascular:     Rate and Rhythm: Normal rate and regular rhythm.     Heart sounds: Normal heart sounds.  Pulmonary:     Effort: Pulmonary effort is normal.     Breath sounds: Normal breath sounds.  Skin:    General: Skin is warm and dry.  Neurological:     Mental Status: He is alert.      Assessment/Plan: Please see individual problem list.  Type 2 diabetes mellitus with hyperglycemia, without long-term current use of insulin  (HCC) Assessment & Plan: Chronic issue with exacerbation of sugars.  Poorly controlled.  He will start back on Ozempic  0.5 mg weekly.  We did not have any samples to give him though if we get some in in the near future we will let him know.  I will send a message to the prior authorization team for them to proceed with prior authorization for Ozempic .  It looks like we never received a prior authorization request from the pharmacy in December when Ozempic  was last sent in.  Advised patient to contact us  towards the end of the week if he has not heard from us  regarding this.  Orders: -     Microalbumin / creatinine urine ratio -     POCT glycosylated hemoglobin (Hb A1C) -     Semaglutide (0.25 or 0.5MG /DOS); Inject 0.5 mg into the skin once a week.  Dispense: 3 mL; Refill: 1  Respiratory illness Assessment & Plan: Improving.  Discussed cough could take 3 to 4 weeks to fully resolve.  He will monitor his symptoms.      Return in about 3 months (around 02/14/2024) for transfer to [facility].   Camellia Her, MD [facility]

## 2023-11-23 ENCOUNTER — Telehealth: Payer: Self-pay

## 2023-11-23 ENCOUNTER — Other Ambulatory Visit (HOSPITAL_COMMUNITY): Payer: Self-pay

## 2023-11-23 NOTE — Telephone Encounter (Signed)
 It is ok to give 4 weeks of samples of ozempic 0.5 mg weekly. Please follow-up with the pharmacy team to check on the PA.

## 2023-11-23 NOTE — Telephone Encounter (Signed)
 PA request has been Submitted. New Encounter created for follow up. For additional info see Pharmacy Prior Auth telephone encounter from 11/23/2023.

## 2023-11-23 NOTE — Telephone Encounter (Signed)
 Pharmacy Patient Advocate Encounter   Received notification from Pt Calls Messages that prior authorization for Ozempic  is required/requested.   Insurance verification completed.   The patient is insured through [employer] .   Per test claim: PA required; PA submitted to above mentioned insurance via CoverMyMeds Key/confirmation #/EOC [id number] Status is pending

## 2023-11-24 ENCOUNTER — Other Ambulatory Visit (HOSPITAL_COMMUNITY): Payer: Self-pay

## 2023-11-24 NOTE — Telephone Encounter (Signed)
 Pharmacy Patient Advocate Encounter  Received notification from [employer] that Prior Authorization for OZEMPIC  2MG /3ML has been APPROVED from 11/23/23 to 11/22/24   PA #/Case ID/Reference #: [id number]

## 2023-11-24 NOTE — Telephone Encounter (Signed)
 Noted.

## 2023-11-25 ENCOUNTER — Telehealth: Payer: Self-pay

## 2023-11-25 NOTE — Telephone Encounter (Signed)
Patient states he is coming in the office to pick up his samples of Ozempic.  Patient states he is still waiting for the prior auth for this medication.  Patient states he was told it should only 3-5 days, but it has been three months.  Patient states we have been back and forth with [facility] on [address], but this still has not been resolved.

## 2023-11-25 NOTE — Telephone Encounter (Signed)
Medication Samples have been provided to the patient.  Drug name: Ozempic       Strength: 0.5mg         Qty: 1  LOT: [id number]  Exp.Date: 04/08/25  Dosing instructions: Inject 0.5mg  subcu weekly  The patient has been instructed regarding the correct time, dose, and frequency of taking this medication, including desired effects and most common side effects.   Kristie Cowman 11:49 AM 11/25/2023

## 2023-11-25 NOTE — Telephone Encounter (Signed)
Pt already notified

## 2023-11-26 NOTE — Telephone Encounter (Signed)
Noted  

## 2023-12-18 ENCOUNTER — Other Ambulatory Visit: Payer: Self-pay | Admitting: Physician Assistant

## 2023-12-18 DIAGNOSIS — E349 Endocrine disorder, unspecified: Secondary | ICD-10-CM

## 2023-12-24 ENCOUNTER — Other Ambulatory Visit: Payer: Self-pay | Admitting: *Deleted

## 2023-12-24 DIAGNOSIS — E349 Endocrine disorder, unspecified: Secondary | ICD-10-CM

## 2023-12-24 DIAGNOSIS — R7989 Other specified abnormal findings of blood chemistry: Secondary | ICD-10-CM

## 2023-12-27 ENCOUNTER — Other Ambulatory Visit: Payer: 59

## 2023-12-27 DIAGNOSIS — R7989 Other specified abnormal findings of blood chemistry: Secondary | ICD-10-CM | POA: Diagnosis not present

## 2023-12-28 ENCOUNTER — Encounter: Payer: Self-pay | Admitting: Family Medicine

## 2023-12-28 LAB — HEMOGLOBIN AND HEMATOCRIT, BLOOD
Hematocrit: 46.3 % (ref 37.5–51.0)
Hemoglobin: 15.5 g/dL (ref 13.0–17.7)

## 2023-12-28 LAB — TESTOSTERONE: Testosterone: 122 ng/dL — ABNORMAL LOW (ref 264–916)

## 2023-12-28 LAB — PSA: Prostate Specific Ag, Serum: 1.4 ng/mL (ref 0.0–4.0)

## 2023-12-30 NOTE — Telephone Encounter (Signed)
 LVM for pt to return call

## 2023-12-31 ENCOUNTER — Other Ambulatory Visit: Payer: Self-pay | Admitting: Physician Assistant

## 2023-12-31 ENCOUNTER — Telehealth: Payer: Self-pay

## 2023-12-31 DIAGNOSIS — E349 Endocrine disorder, unspecified: Secondary | ICD-10-CM

## 2023-12-31 MED ORDER — TESTOSTERONE 1.62 % TD GEL: 4.0000 | Freq: Every day | TRANSDERMAL | 0 refills | Status: DC

## 2023-12-31 NOTE — Telephone Encounter (Signed)
Pt states he will call to schedule 3 months for TRT follow up and repeat testosterone, PSA, H&H. With [facility]

## 2023-12-31 NOTE — Progress Notes (Signed)
75g testosterone gel is only lasting him 15 days at a time; adjusted rx for 150g dispensation per fill.

## 2024-01-20 ENCOUNTER — Ambulatory Visit: Admitting: Nurse Practitioner

## 2024-01-20 ENCOUNTER — Encounter: Payer: Self-pay | Admitting: Nurse Practitioner

## 2024-01-20 ENCOUNTER — Other Ambulatory Visit: Payer: Self-pay | Admitting: Nurse Practitioner

## 2024-01-20 VITALS — BP 124/80 | HR 87 | Temp 97.6°F | Ht 74.0 in | Wt 239.4 lb

## 2024-01-20 DIAGNOSIS — Z7985 Long-term (current) use of injectable non-insulin antidiabetic drugs: Secondary | ICD-10-CM

## 2024-01-20 DIAGNOSIS — E1165 Type 2 diabetes mellitus with hyperglycemia: Secondary | ICD-10-CM | POA: Diagnosis not present

## 2024-01-20 MED ORDER — SEMAGLUTIDE (1 MG/DOSE) 4 MG/3ML ~~LOC~~ SOPN: 1.0000 mg | PEN_INJECTOR | SUBCUTANEOUS | 4 refills | Status: DC

## 2024-01-20 NOTE — Progress Notes (Signed)
 Established Patient Office Visit  Subjective:  Patient ID: William Frey, male    DOB: [birth year]  Age: 53 y.o. MRN: [id number]  CC:  Chief Complaint  Patient presents with   Medical Management of Chronic Issues    Refill Ozempic   Discussed the use of a AI scribe software for clinical note transcription with the patient, who gave verbal consent to proceed.   HPI  William Frey with type 2 diabetes mellitus, presents for a prescription refill for Ozempic.  He has tried metformin, Victoza, Trulicity.  He stopped Trulicity related to bruising of the skin and skin issues after injection. He discontinued Victoza due to insufficient blood sugar control and associated side effect including headache and dizziness. He feels best on Ozempic with no side effects like abdominal pain. On Ozempic, fasting blood sugar is around 170 mg/dL, compared to 409 mg/dL when not on it. Last A1c was 10.3   HPI   Past Medical History:  Diagnosis Date   Allergy    Anxiety    Arthritis    Asthma    Chicken pox    Complication of anesthesia    difficult to wake up   COVID-19    11/26/20 sp [facility] infusion   Diabetes mellitus without complication (HCC)    Family history of adverse reaction to anesthesia    father had low tolerance for anesthesia   GERD (gastroesophageal reflux disease)    History of kidney stones    Hyperlipidemia    Hypertension    Low testosterone    Migraines     Past Surgical History:  Procedure Laterality Date   CYSTOSCOPY W/ RETROGRADES Right 10/28/2018   Procedure: CYSTOSCOPY WITH RETROGRADE PYELOGRAM;  Surgeon: Sondra Come, MD;  Location: [facility];  Service: Urology;  Laterality: Right;   CYSTOSCOPY/URETEROSCOPY/HOLMIUM LASER/STENT PLACEMENT Right 10/28/2018   Procedure: CYSTOSCOPY/URETEROSCOPY/HOLMIUM LASER/STENT PLACEMENT;  Surgeon: Sondra Come, MD;  Location: [facility];  Service: Urology;  Laterality: Right;   OSTECTOMY METATARSAL Right 2003     Family History  Problem Relation Age of Onset   Arthritis Mother    Cancer Mother        breast and bone   Heart Problems Mother    Arthritis Father    Lymphoma Father    Heart disease Father    Stroke Father    Hypertension Father    Clotting disorder Father    Cancer Brother        lung   Heart disease Brother        atriall fibrillation   Diabetes Brother    Heart disease Brother        atrial fibrillation   Diabetes Maternal Aunt    Diabetes Maternal Aunt     Social History   Socioeconomic History   Marital status: Married    Spouse name: Not on file   Number of children: Not on file   Years of education: Not on file   Highest education level: Not on file  Occupational History   Not on file  Tobacco Use   Smoking status: Former    Current packs/day: 0.00    Types: Cigarettes    Quit date: 09/17/2013    Years since quitting: 10.3    Passive exposure: Past   Smokeless tobacco: Never   Tobacco comments:    Patient smoked on and off from 1989 to 2015. Patient has smoked a total of 14 years and averaged 1.5 packs per day =  21 pack year history.  Vaping Use   Vaping status: Never Used  Substance and Sexual Activity   Alcohol use: No    Alcohol/week: 0.0 standard drinks of alcohol    Comment: former   Drug use: No   Sexual activity: Yes  Other Topics Concern   Not on file  Social History Narrative   Married          Social Drivers of Corporate investment banker Strain: Not on file  Food Insecurity: Not on file  Transportation Needs: Not on file  Physical Activity: Not on file  Stress: Not on file  Social Connections: Not on file  Intimate Partner Violence: Not on file     Outpatient Medications Prior to Visit  Medication Sig Dispense Refill   albuterol (VENTOLIN HFA) 108 (90 Base) MCG/ACT inhaler Inhale 1-2 puffs into the lungs every 6 (six) hours as needed. 18 g 0   Blood Glucose Monitoring Suppl DEVI 1 each by Does not apply route in the  morning, at noon, and at bedtime. May substitute to any manufacturer covered by patient's insurance. 1 each 0   Glucose Blood (BLOOD GLUCOSE TEST STRIPS) STRP 1 each by In Vitro route in the morning, at noon, and at bedtime. May substitute to any manufacturer covered by patient's insurance. 100 strip 1   Lancet Device MISC 1 each by Does not apply route in the morning, at noon, and at bedtime. May substitute to any manufacturer covered by patient's insurance. 1 each 0   Lancets (ONETOUCH ULTRASOFT) lancets Use as instructed 100 each 12   Lancets Misc. MISC 1 each by Does not apply route in the morning, at noon, and at bedtime. May substitute to any manufacturer covered by patient's insurance. 100 each 2   Semaglutide,0.25 or 0.5MG /DOS, 2 MG/3ML SOPN Inject 0.5 mg into the skin once a week. 3 mL 1   Testosterone 1.62 % GEL Apply 4 Pump topically daily. PLACE 4 PUMPS ONTO THE SKIN DAILY 150 g 0   azithromycin (ZITHROMAX) 250 MG tablet Take 1 tablet (250 mg total) by mouth daily. Take first 2 tablets together, then 1 every day until finished. 6 tablet 0   oxyCODONE-acetaminophen (PERCOCET) 5-325 MG tablet Take 1 tablet by mouth every 4 (four) hours as needed for severe pain (pain score 7-10). 12 tablet 0   No facility-administered medications prior to visit.    Allergies  Allergen Reactions   Penicillins Hives    Childhood allergy Has patient had a PCN reaction causing immediate rash, facial/tongue/throat swelling, SOB or lightheadedness with hypotension: No Has patient had a PCN reaction causing severe rash involving mucus membranes or skin necrosis: No Has patient had a PCN reaction that required hospitalization: No Has patient had a PCN reaction occurring within the last 10 years: No If all of the above answers are "NO", then may proceed with Cephalosporin use.     ROS Review of Systems Negative unless indicated in HPI.    Objective:    Physical Exam Constitutional:      Appearance:  Normal appearance.  Cardiovascular:     Rate and Rhythm: Normal rate and regular rhythm.     Pulses: Normal pulses.     Heart sounds: Normal heart sounds.  Musculoskeletal:     Cervical back: Normal range of motion.  Neurological:     General: No focal deficit present.     Mental Status: He is alert. Mental status is at baseline.  Psychiatric:  Mood and Affect: Mood normal.        Behavior: Behavior normal.        Thought Content: Thought content normal.        Judgment: Judgment normal.     BP 124/80   Pulse 87   Temp 97.6 F (36.4 C)   Ht 6\' 2"  (1.88 m)   Wt 239 lb 6.4 oz (108.6 kg)   SpO2 96%   BMI 30.74 kg/m  Wt Readings from Last 3 Encounters:  01/20/24 239 lb 6.4 oz (108.6 kg)  11/16/23 237 lb 6.4 oz (107.7 kg)  10/07/23 245 lb (111.1 kg)     Health Maintenance  Topic Date Due   Pneumococcal Vaccine 89-53 Years old (1 of 2 - PCV) Never done   DTaP/Tdap/Td (1 - Tdap) Never done   Colonoscopy  Never done   Zoster Vaccines- Shingrix (1 of 2) Never done   COVID-19 Vaccine (1 - 2024-25 season) 02/05/2024 (Originally 07/11/2023)   INFLUENZA VACCINE  02/07/2024 (Originally 06/10/2023)   OPHTHALMOLOGY EXAM  03/31/2024   FOOT EXAM  04/01/2024   HEMOGLOBIN A1C  05/15/2024   Diabetic kidney evaluation - eGFR measurement  10/06/2024   Diabetic kidney evaluation - Urine ACR  11/15/2024   Hepatitis C Screening  Completed   HPV VACCINES  Aged Out   Lung Cancer Screening  Discontinued    There are no preventive care reminders to display for this patient.  Lab Results  Component Value Date   TSH 1.71 09/21/2019   Lab Results  Component Value Date   WBC 7.9 10/07/2023   HGB 15.5 12/27/2023   HCT 46.3 12/27/2023   MCV 90.1 10/07/2023   PLT 203 10/07/2023   Lab Results  Component Value Date   NA 133 (L) 10/07/2023   K 4.0 10/07/2023   CO2 21 (L) 10/07/2023   GLUCOSE 243 (H) 10/07/2023   BUN 20 10/07/2023   CREATININE 1.21 10/07/2023   BILITOT 0.5  10/07/2023   ALKPHOS 58 10/07/2023   AST 16 10/07/2023   ALT 18 10/07/2023   PROT 7.5 10/07/2023   ALBUMIN 4.0 10/07/2023   CALCIUM 9.1 10/07/2023   ANIONGAP 9 10/07/2023   GFR 72.96 06/01/2022   Lab Results  Component Value Date   CHOL 223 (H) 04/02/2023   Lab Results  Component Value Date   HDL 33 (L) 04/02/2023   Lab Results  Component Value Date   [facility]  04/02/2023     Comment:     . LDL cholesterol not calculated. Triglyceride levels greater than 400 mg/dL invalidate calculated LDL results. . Reference range: <100 . Desirable range <100 mg/dL for primary prevention;   <70 mg/dL for patients with CHD or diabetic patients  with > or = 2 CHD risk factors. Marland Kitchen LDL-C is now calculated using the Martin-Hopkins  calculation, which is a validated novel method providing  better accuracy than the Friedewald equation in the  estimation of LDL-C.  Horald Pollen et al. Lenox Ahr. 6295;284(13): 2061-2068  (http://education.[employer].com/faq/FAQ164)    Lab Results  Component Value Date   TRIG 521 (H) 04/02/2023   Lab Results  Component Value Date   CHOLHDL 6.8 (H) 04/02/2023   Lab Results  Component Value Date   HGBA1C 10.3 (A) 11/16/2023   HGBA1C 10.3 11/16/2023   HGBA1C 10.3 (A) 11/16/2023   HGBA1C 10.3 (A) 11/16/2023      Assessment & Plan:  Type 2 diabetes mellitus with hyperglycemia, without long-term current use of insulin (HCC) Assessment &  Plan: Type 2 Diabetes Mellitus managed with Ozempic due to metformin ineffectiveness. Fasting glucose improved from 340 mg/dL to 161 mg/dL. Last HbA1c was 10.3%. Current dose 0.5 mg weekly, tolerated well, with improvement noted. Plan to increase dose for better control and weight loss. - Increase Ozempic to 1 mg weekly. - Check HbA1c in April.   Other orders -     Semaglutide (1 MG/DOSE); Inject 1 mg as directed once a week.  Dispense: 3 mL; Refill: 4    Follow-up: No follow-ups on file.   Kara Dies, NP

## 2024-01-25 ENCOUNTER — Telehealth: Payer: Self-pay | Admitting: Family Medicine

## 2024-01-25 NOTE — Telephone Encounter (Signed)
 Dr Birdie Sons is no longer at this location. Please call the office to schedule a Transfer of Care to either Dr Charlann Lange, Darleen Crocker or Kara Dies, NP.  Left message with pt and sent MyChart message containing options for TOC. Will call back after consulting wife. E2C2, please schedule a TOC visit for this patient. [facility]   Thank you

## 2024-01-27 NOTE — Telephone Encounter (Signed)
 Please call the pharmacy and check the pt had  refilled the medication in January, 2025

## 2024-01-28 NOTE — Telephone Encounter (Signed)
 If it was covered earlier why is not covered now? There is no change pt insurance.

## 2024-01-28 NOTE — Telephone Encounter (Signed)
 [facility] states patient picked up the Ozempic in January & February 2025.

## 2024-01-30 NOTE — Assessment & Plan Note (Addendum)
 Type 2 Diabetes Mellitus managed with Ozempic due to metformin ineffectiveness. Fasting glucose improved from 340 mg/dL to 742 mg/dL. Last HbA1c was 10.3%. Current dose 0.5 mg weekly, tolerated well, with improvement noted. Plan to increase dose for better control and weight loss. - Increase Ozempic to 1 mg weekly. - Check HbA1c in April.

## 2024-01-31 NOTE — Telephone Encounter (Signed)
 The pts wife called in requesting a f/u once the the PA for the pts medication has been placed

## 2024-02-02 ENCOUNTER — Other Ambulatory Visit (HOSPITAL_COMMUNITY): Payer: Self-pay

## 2024-02-02 ENCOUNTER — Telehealth: Payer: Self-pay | Admitting: Pharmacy Technician

## 2024-02-02 NOTE — Telephone Encounter (Signed)
 Pharmacy Patient Advocate Encounter   Received notification from CoverMyMeds that prior authorization for Ozempic (1 MG/DOSE) 4MG /3ML pen-injectors is required/requested.   Insurance verification completed.   The patient is insured through [facility] .   Per test claim: PA required; PA started via CoverMyMeds. KEY [id number] . Waiting for clinical questions to populate.

## 2024-02-07 NOTE — Telephone Encounter (Signed)
 Pharmacy Patient Advocate Encounter  Received notification from [facility] that Prior Authorization for Ozempic (1 MG/DOSE) 4MG /3ML  has been Denied, full denial letter will be uploaded in the media tab   PA #/Case ID/Reference #: [id number]

## 2024-02-09 NOTE — Telephone Encounter (Signed)
 I have attached below the prior pre authorization for the pt dated 11/24/23 that was approved. Can you please look into it. It was authorized before.   "Received notification from [employer] that Prior Authorization for William Frey 2MG /3ML has been APPROVED from 11/23/23 to 11/22/24   PA #/Case ID/Reference #: [id number]"

## 2024-02-11 ENCOUNTER — Other Ambulatory Visit (HOSPITAL_COMMUNITY): Payer: Self-pay

## 2024-02-11 NOTE — Telephone Encounter (Signed)
 Good afternoon of course I will be more then happy to call his insurance, the script that was sent to [facility] on 01/20/24 was for Ozempic 1mg /dose but the PA that was approved in January was for Ozempic 2mg /56ml I just need to know which dose is he currently on?

## 2024-02-14 NOTE — Telephone Encounter (Signed)
 Copied from CRM [id number]. Topic: Clinical - Prescription Issue >> Feb 14, 2024  8:16 AM Fredrich Romans wrote: Reason for CRM: Patients wife called in requesting a phone call regarding her husband denial for ozempic. Her husband is out of medication.She stated that they had to go through this the last time. Please call wife Melody [phone].

## 2024-02-15 ENCOUNTER — Other Ambulatory Visit (HOSPITAL_COMMUNITY): Payer: Self-pay

## 2024-02-15 NOTE — Telephone Encounter (Signed)
 Noted.

## 2024-02-15 NOTE — Telephone Encounter (Signed)
 Good morning I just spoke to [name] and he informed me that his wife picked up his Ozempic yesterday (02/14/24) from [facility]

## 2024-02-24 ENCOUNTER — Other Ambulatory Visit: Payer: Self-pay

## 2024-02-24 MED ORDER — OZEMPIC (1 MG/DOSE) 4 MG/3ML ~~LOC~~ SOPN: 1.0000 mg | PEN_INJECTOR | SUBCUTANEOUS | 4 refills | Status: DC

## 2024-02-24 NOTE — Telephone Encounter (Signed)
 PA has been approved documented in separate encounter, please sign off on rx in this encounter as PA team is unable to resolve RX requests. Thank you

## 2024-03-09 ENCOUNTER — Encounter: Payer: Self-pay | Admitting: Family Medicine

## 2024-03-09 ENCOUNTER — Telehealth: Payer: Self-pay | Admitting: Primary Care

## 2024-03-09 ENCOUNTER — Telehealth: Payer: Self-pay | Admitting: General Practice

## 2024-03-09 ENCOUNTER — Ambulatory Visit: Admitting: Family Medicine

## 2024-03-09 ENCOUNTER — Other Ambulatory Visit: Payer: Self-pay

## 2024-03-09 VITALS — BP 120/82 | HR 79 | Ht 74.0 in | Wt 238.0 lb

## 2024-03-09 DIAGNOSIS — M25551 Pain in right hip: Secondary | ICD-10-CM | POA: Diagnosis not present

## 2024-03-09 MED ORDER — CYCLOBENZAPRINE HCL 10 MG PO TABS: 10.0000 mg | ORAL_TABLET | Freq: Three times a day (TID) | ORAL | 1 refills | Status: DC | PRN

## 2024-03-09 NOTE — Progress Notes (Signed)
 I, Miquel Amen, CMA acting as a scribe for William Juniper, MD.  William Frey is a 53 y.o. male who presents to [facility] today for right hip pain x 6+ months. Pt locates pain to lateral aspect of the hip and thigh. Denies n/t/w. No relief with NSAIDs or analgesics OTC. Denies low back pain. Denies mechanical sx. 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 Pt is uncontrolled diabetic. Now on Ozempic . BG over the past few days ranges from 111-146, fasting.   Radiates:  R LE Aggravates: sitting, driving, side-lying, supine Treatments tried: OTC NSAID and analgesics.   Pertinent review of systems: No fevers or chills  Relevant historical information: Diabetes currently pretty well-controlled. Needle phobia  Exam:  BP 120/82   Pulse 79   Ht 6\' 2"  (1.88 m)   Wt 238 lb (108 kg)   SpO2 97%   BMI 30.56 kg/m  General: Well Developed, well nourished, and in no acute distress.   MSK:  Right hip: Normal-appearing Tender palpation greater trochanter.  Mild reduced strength resisted hip abduction and external rotation.  Lab and Radiology Results  Procedure: Real-time Ultrasound Guided Injection of right greater trochanter bursa Device: Philips Affiniti 50G/GE Logiq Images permanently stored and available for review in PACS Verbal informed consent obtained.  Discussed risks and benefits of procedure. Warned about infection, bleeding, hyperglycemia damage to structures among others. Patient expresses understanding and agreement Time-out conducted.   Noted no overlying erythema, induration, or other signs of local infection.   Skin prepped in a sterile fashion.   Local anesthesia: Topical Ethyl chloride.   With sterile technique and under real time ultrasound guidance: 40 mg of Kenalog and 2 ml of Marcaine injected into her trochanter bursa. Fluid seen entering the bursa.   Completed without difficulty   Pain immediately resolved suggesting accurate placement  of the medication.   Advised to call if fevers/chills, erythema, induration, drainage, or persistent bleeding.   Images permanently stored and available for review in the ultrasound unit.  Impression: Technically successful ultrasound guided injection.        Assessment and Plan: 53 y.o. male with chronic right lateral hip pain.  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  Previously he has been unable to have an injection because his blood sugar was so poorly controlled.  However now on Ozempic  he is doing great.  His fasting blood sugar has been under 150 3 times in a row last week.  Even though his A1c was out of control recently I think he is going to find that he is quite in control on the next A1c check.  I did recommend a new PCP for him Gabriel John, DNP, AGNP-C   PDMP not reviewed this encounter. Orders Placed This Encounter  Procedures   US  LIMITED JOINT SPACE STRUCTURES LOW RIGHT(NO LINKED CHARGES)    Reason for Exam (SYMPTOM  OR DIAGNOSIS REQUIRED):   right hip/thigh pain    Preferred imaging location?:   [facility] ordered this encounter  Medications   cyclobenzaprine  (FLEXERIL ) 10 MG tablet    Sig: Take 1 tablet (10 mg total) by mouth 3 (three) times daily as needed for muscle spasms.    Dispense:  30 tablet    Refill:  1     Discussed warning signs or symptoms. Please  see discharge instructions. Patient expresses understanding.   The above documentation has been reviewed and is accurate and complete William Frey, M.D.

## 2024-03-09 NOTE — Telephone Encounter (Signed)
 Lvm for wife ok to schedule NP apt for Husband with her.

## 2024-03-09 NOTE — Patient Instructions (Addendum)
 Thank you for coming in today.   You received an injection today. Seek immediate medical attention if the joint becomes red, extremely painful, or is oozing fluid.   Let me know how it goes  I recommend Gabriel John, DNP, AGNP-C as your new PCP.  [email or web address]

## 2024-03-09 NOTE — Telephone Encounter (Signed)
 Patient is needing a PCP.  I am happy to accept him as a patient of mine.  Please contact his wife on DPR to schedule an appointment when it is convenient.

## 2024-03-09 NOTE — Telephone Encounter (Signed)
 Please notify patient's wife that we attempted to call to schedule an appointment for new patient visit about 1 hour ago.  They were unable to get a hold of anybody.

## 2024-03-09 NOTE — Telephone Encounter (Signed)
 Copied from CRM [id number]. Topic: Appointments - Scheduling Inquiry for Clinic >> Mar 09, 2024  2:34 PM Jethro Morrison wrote: Reason for CRM: PT CALLED STATED DR Randel Buss EVANS REFERRED HIM TO [facility] AND THEY HAD A VERBAL CONVERSATION ABOUT HIM SEE HER SINCE DR Juluis Ok IS NO LONGER WITH [facility]. ADVISED HIM AND HIS WIFE SHE WAS NOT ACCEPTING NEW PATIENTS AND THEY ADVISED ME OF THE ABOVE CONVERSATION.

## 2024-03-10 NOTE — Telephone Encounter (Signed)
 Spoke to patient and scheduled

## 2024-03-20 ENCOUNTER — Ambulatory Visit: Admitting: Primary Care

## 2024-03-20 ENCOUNTER — Encounter: Payer: Self-pay | Admitting: Primary Care

## 2024-03-20 VITALS — BP 118/80 | HR 75 | Temp 98.0°F | Ht 72.25 in | Wt 235.0 lb

## 2024-03-20 DIAGNOSIS — E1165 Type 2 diabetes mellitus with hyperglycemia: Secondary | ICD-10-CM

## 2024-03-20 DIAGNOSIS — E785 Hyperlipidemia, unspecified: Secondary | ICD-10-CM | POA: Diagnosis not present

## 2024-03-20 DIAGNOSIS — G629 Polyneuropathy, unspecified: Secondary | ICD-10-CM

## 2024-03-20 DIAGNOSIS — M1611 Unilateral primary osteoarthritis, right hip: Secondary | ICD-10-CM

## 2024-03-20 DIAGNOSIS — E291 Testicular hypofunction: Secondary | ICD-10-CM | POA: Diagnosis not present

## 2024-03-20 DIAGNOSIS — I10 Essential (primary) hypertension: Secondary | ICD-10-CM

## 2024-03-20 DIAGNOSIS — Z7985 Long-term (current) use of injectable non-insulin antidiabetic drugs: Secondary | ICD-10-CM

## 2024-03-20 LAB — POCT GLYCOSYLATED HEMOGLOBIN (HGB A1C): Hemoglobin A1C: 8.3 % — AB (ref 4.0–5.6)

## 2024-03-20 NOTE — Assessment & Plan Note (Signed)
 Improved with A1C of 8.3 today.  Still above goal of 7.0.   As he just resumed Ozempic  3-4 weeks ago we will continue Ozempic  1 mg weekly for now. Remain off other treatment.  Discussed to work on diet.  Follow up in 3 months.

## 2024-03-20 NOTE — Patient Instructions (Signed)
 Please schedule a physical to meet with me in 3 months.   It was a pleasure meeting you!

## 2024-03-20 NOTE — Assessment & Plan Note (Signed)
 Following with sports medicine. Office notes reviewed from May 2025.

## 2024-03-20 NOTE — Assessment & Plan Note (Signed)
 Following with Urology.  Continue testosterone  gel 1.62% as prescribed.

## 2024-03-20 NOTE — Assessment & Plan Note (Signed)
 He is not fasting today.  We will plan to check lipids at next visit.  He will come fasting.

## 2024-03-20 NOTE — Assessment & Plan Note (Signed)
Controlled. Remain off treatment.

## 2024-03-20 NOTE — Progress Notes (Signed)
 Subjective:    Patient ID: William Frey, male    DOB: October 09, 1971, 53 y.o.   MRN: [id number]  HPI  William Frey is a very pleasant 53 y.o. male with a history of hypertension, type 2 diabetes, hypogonadism, BPH, hyperlipidemia, anxiety depression, insomnia who presents today to transfer care from Dr. Lovetta Rucks.   1) Type 2 Diabetes:   Current medications include: Ozempic  1 mg weekly. He resumed his Ozempic  1 mg dose for about 3-4 weeks due to difficulty with insurance company.   He is checking his blood glucose on occasion. Over the last 1 week he has been getting readings of:  AM fasting: 130, 120, 111  Last A1C: 10.3 in January 2025, 8.3 today.  Last Eye Exam: UTD Last Foot Exam: UTD Pneumonia Vaccination: never completed  Urine Microalbumin: UTD Statin: Declines   Dietary changes since last visit: He has noticed a reduction in his appetite so he is eating smaller potion sizes. He does eat fast food/take out food often due to his occupation.    Exercise: Active at work.   2) Hypertension/Hyperlipidemia: Currently not on treatment for either.  Last lipid panel was completed in May 2024 with triglyceride level of 521, LDL unable to be calculated.  A prescription for rosuvastatin  20 mg was sent to his pharmacy.   Today he discusses that he didn't want to take the statin medication due to what he's been reading about statins causing dementia. He denies chest pain, shortness of breath, headaches.   BP Readings from Last 3 Encounters:  03/20/24 118/80  03/09/24 120/82  01/20/24 124/80     3) Testosterone  Deficiency/BPH: Following with urology and is managed on testosterone  1.62% gel.  He applies his testosterone  gel a few days weekly as he often forgets.   4) Chronic Hip Pain: Chronic to the right hip. Following with sports medicine, last office visit was in May 2025 where he received a steroid injection and received a prescription for cyclobenzaprine  10 mg tablets.    Today he feels improved since his injection. His pain has reduced.   Review of Systems  Eyes:  Negative for visual disturbance.  Respiratory:  Negative for shortness of breath.   Cardiovascular:  Negative for chest pain.  Gastrointestinal:  Negative for constipation and diarrhea.  Musculoskeletal:  Positive for arthralgias.  Neurological:  Negative for headaches.         Past Medical History:  Diagnosis Date   Allergy     Anxiety    Arthritis    Asthma    Chicken pox    Complication of anesthesia    difficult to wake up   COVID-19    11/26/20 sp MAB infusion   Detachment of glenoid labrum 07/05/2020   Diabetes mellitus without complication [facility])    Family history of adverse reaction to anesthesia    father had low tolerance for anesthesia   GERD (gastroesophageal reflux disease)    History of COVID-19 02/10/2021   History of kidney stones    Hyperlipidemia    Hypertension    Low testosterone     Lumbar sprain 07/05/2020   Migraines    Neck sprain 07/05/2020   Nephrolithiasis 11/12/2018   Pain in both feet 06/01/2022   Panic attack 12/27/2020   Scalp lesion 11/12/2022    Social History   Socioeconomic History   Marital status: Married    Spouse name: Not on file   Number of children: Not on file   Years of education: Not  on file   Highest education level: Not on file  Occupational History   Not on file  Tobacco Use   Smoking status: Former    Current packs/day: 0.00    Types: Cigarettes    Quit date: 09/17/2013    Years since quitting: 10.5    Passive exposure: Past   Smokeless tobacco: Never   Tobacco comments:    Patient smoked on and off from 1989 to 2015. Patient has smoked a total of 14 years and averaged 1.5 packs per day = 21 pack year history.  Vaping Use   Vaping status: Never Used  Substance and Sexual Activity   Alcohol use: No    Alcohol/week: 0.0 standard drinks of alcohol    Comment: former   Drug use: No   Sexual activity: Yes   Other Topics Concern   Not on file  Social History Narrative   Married          Social Drivers of Health   Financial Resource Strain: Not on file  Food Insecurity: Not on file  Transportation Needs: Not on file  Physical Activity: Not on file  Stress: Not on file  Social Connections: Not on file  Intimate Partner Violence: Not on file    Past Surgical History:  Procedure Laterality Date   CYSTOSCOPY W/ RETROGRADES Right 10/28/2018   Procedure: CYSTOSCOPY WITH RETROGRADE PYELOGRAM;  Surgeon: Lawerence Pressman, MD;  Location: [facility] ORS;  Service: Urology;  Laterality: Right;   CYSTOSCOPY/URETEROSCOPY/HOLMIUM LASER/STENT PLACEMENT Right 10/28/2018   Procedure: CYSTOSCOPY/URETEROSCOPY/HOLMIUM LASER/STENT PLACEMENT;  Surgeon: Lawerence Pressman, MD;  Location: [facility] ORS;  Service: Urology;  Laterality: Right;   OSTECTOMY METATARSAL Right 2003    Family History  Problem Relation Age of Onset   Arthritis Mother    Cancer Mother        breast and bone   Heart Problems Mother    Arthritis Father    Lymphoma Father    Heart disease Father    Stroke Father    Hypertension Father    Clotting disorder Father    Cancer Brother        lung   Heart disease Brother        atriall fibrillation   Diabetes Brother    Heart disease Brother        atrial fibrillation   Diabetes Maternal Aunt    Diabetes Maternal Aunt     Allergies  Allergen Reactions   Penicillins Hives    Childhood allergy  Has patient had a PCN reaction causing immediate rash, facial/tongue/throat swelling, SOB or lightheadedness with hypotension: No Has patient had a PCN reaction causing severe rash involving mucus membranes or skin necrosis: No Has patient had a PCN reaction that required hospitalization: No Has patient had a PCN reaction occurring within the last 10 years: No If all of the above answers are "NO", then may proceed with Cephalosporin use.     Current Outpatient Medications on File Prior to  Visit  Medication Sig Dispense Refill   albuterol  (VENTOLIN  HFA) 108 (90 Base) MCG/ACT inhaler Inhale 1-2 puffs into the lungs every 6 (six) hours as needed. 18 g 0   Blood Glucose Monitoring Suppl DEVI 1 each by Does not apply route in the morning, at noon, and at bedtime. May substitute to any manufacturer covered by patient's insurance. 1 each 0   cyclobenzaprine  (FLEXERIL ) 10 MG tablet Take 1 tablet (10 mg total) by mouth 3 (three) times daily as needed for muscle spasms.  30 tablet 1   Glucose Blood (BLOOD GLUCOSE TEST STRIPS) STRP 1 each by In Vitro route in the morning, at noon, and at bedtime. May substitute to any manufacturer covered by patient's insurance. 100 strip 1   Lancet Device MISC 1 each by Does not apply route in the morning, at noon, and at bedtime. May substitute to any manufacturer covered by patient's insurance. 1 each 0   Lancets (ONETOUCH ULTRASOFT) lancets Use as instructed 100 each 12   Lancets Misc. MISC 1 each by Does not apply route in the morning, at noon, and at bedtime. May substitute to any manufacturer covered by patient's insurance. 100 each 2   Semaglutide , 1 MG/DOSE, (OZEMPIC , 1 MG/DOSE,) 4 MG/3ML SOPN Inject 1 mg into the skin once a week. 3 mL 4   Testosterone  1.62 % GEL Apply 4 Pump topically daily. PLACE 4 PUMPS ONTO THE SKIN DAILY 150 g 0   No current facility-administered medications on file prior to visit.    BP 118/80   Pulse 75   Temp 98 F (36.7 C) (Oral)   Ht 6' 0.25" (1.835 m)   Wt 235 lb (106.6 kg)   SpO2 97%   BMI 31.65 kg/m  Objective:   Physical Exam Cardiovascular:     Rate and Rhythm: Normal rate and regular rhythm.  Pulmonary:     Effort: Pulmonary effort is normal.     Breath sounds: Normal breath sounds.  Musculoskeletal:     Cervical back: Neck supple.  Skin:    General: Skin is warm and dry.  Neurological:     Mental Status: He is alert and oriented to person, place, and time.  Psychiatric:        Mood and Affect: Mood  normal.           Assessment & Plan:  Type 2 diabetes mellitus with hyperglycemia, without long-term current use of insulin (HCC) Assessment & Plan: Improved with A1C of 8.3 today.  Still above goal of 7.0.   As he just resumed Ozempic  3-4 weeks ago we will continue Ozempic  1 mg weekly for now. Remain off other treatment.  Discussed to work on diet.  Follow up in 3 months.  Orders: -     POCT glycosylated hemoglobin (Hb A1C)  Essential hypertension Assessment & Plan: Controlled.  Remain off treatment.    Hypogonadism in male Assessment & Plan: Following with Urology.  Continue testosterone  gel 1.62% as prescribed.    Neuropathy Assessment & Plan: Stable.  No concerns today.    Arthritis of right hip Assessment & Plan: Following with sports medicine. Office notes reviewed from May 2025.     Hyperlipidemia, unspecified hyperlipidemia type Assessment & Plan: He is not fasting today.  We will plan to check lipids at next visit.  He will come fasting.          Argelio Granier K Lacoya Wilbanks, NP

## 2024-03-20 NOTE — Assessment & Plan Note (Signed)
 Stable. No concerns today

## 2024-05-10 ENCOUNTER — Telehealth: Admitting: Family Medicine

## 2024-05-10 DIAGNOSIS — L089 Local infection of the skin and subcutaneous tissue, unspecified: Secondary | ICD-10-CM | POA: Diagnosis not present

## 2024-05-10 MED ORDER — DOXYCYCLINE HYCLATE 100 MG PO TABS: 100.0000 mg | ORAL_TABLET | Freq: Two times a day (BID) | ORAL | 0 refills | Status: AC

## 2024-05-10 NOTE — Patient Instructions (Addendum)
 William Frey, thank you for joining William CHRISTELLA Barefoot, NP for today's virtual visit.  While this provider is not your primary care provider (PCP), if your PCP is located in our provider database this encounter information will be shared with them immediately following your visit.   A [facility] MyChart account gives you access to today's visit and all your visits, tests, and labs performed at [facility]  click here if you don't have a [facility] MyChart account or go to [email or web address]  Consent: (Patient) William Frey provided verbal consent for this virtual visit at the beginning of the encounter.  Current Medications:  Current Outpatient Medications:    doxycycline  (VIBRA -TABS) 100 MG tablet, Take 1 tablet (100 mg total) by mouth 2 (two) times daily for 7 days., Disp: 14 tablet, Rfl: 0   albuterol  (VENTOLIN  HFA) 108 (90 Base) MCG/ACT inhaler, Inhale 1-2 puffs into the lungs every 6 (six) hours as needed., Disp: 18 g, Rfl: 0   Blood Glucose Monitoring Suppl DEVI, 1 each by Does not apply route in the morning, at noon, and at bedtime. May substitute to any manufacturer covered by patient's insurance., Disp: 1 each, Rfl: 0   cyclobenzaprine  (FLEXERIL ) 10 MG tablet, Take 1 tablet (10 mg total) by mouth 3 (three) times daily as needed for muscle spasms., Disp: 30 tablet, Rfl: 1   Glucose Blood (BLOOD GLUCOSE TEST STRIPS) STRP, 1 each by In Vitro route in the morning, at noon, and at bedtime. May substitute to any manufacturer covered by patient's insurance., Disp: 100 strip, Rfl: 1   Lancet Device MISC, 1 each by Does not apply route in the morning, at noon, and at bedtime. May substitute to any manufacturer covered by patient's insurance., Disp: 1 each, Rfl: 0   Lancets (ONETOUCH ULTRASOFT) lancets, Use as instructed, Disp: 100 each, Rfl: 12   Lancets Misc. MISC, 1 each by Does not apply route in the morning, at noon, and at bedtime. May substitute to any  manufacturer covered by patient's insurance., Disp: 100 each, Rfl: 2   Semaglutide , 1 MG/DOSE, (OZEMPIC , 1 MG/DOSE,) 4 MG/3ML SOPN, Inject 1 mg into the skin once a week., Disp: 3 mL, Rfl: 4   Testosterone  1.62 % GEL, Apply 4 Pump topically daily. PLACE 4 PUMPS ONTO THE SKIN DAILY, Disp: 150 g, Rfl: 0   Medications ordered in this encounter:  Meds ordered this encounter  Medications   doxycycline  (VIBRA -TABS) 100 MG tablet    Sig: Take 1 tablet (100 mg total) by mouth 2 (two) times daily for 7 days.    Dispense:  14 tablet    Refill:  0    Supervising Provider:   BLAISE ALEENE MALVA [[id number]]     *If you need refills on other medications prior to your next appointment, please contact your pharmacy*  Follow-Up: Call back or seek an in-person evaluation if the symptoms worsen or if the condition fails to improve as anticipated.   Virtual Care [phone]  Other Instructions  Cellulitis, Adult  Cellulitis is a skin infection. The infected area is often warm, red, swollen, and sore. It occurs most often on the legs, feet, and toes, but can happen on any part of the body. This condition can be life-threatening without treatment. It is very important to get treated right away. What are the causes? This condition is caused by bacteria. The bacteria enter through a break in the skin, such as: A cut. A burn. A bug bite. An  animal bite. An open sore. A crack. What increases the risk? Having a weak body's defense system (immune system). Being older than 52 years old. Having a blood sugar problem (diabetes). Having a long-term liver disease (cirrhosis) or kidney disease. Being very overweight (obese). Having a skin problem, such as: An itchy rash. A rash caused by a fungus. A rash with blisters. Slow movement of blood in the veins (venous stasis). Fluid buildup below the skin (edema). This condition is more likely to occur in people who: Have open cuts, burns, bites,  or scrapes on the skin. Have been treated with high-energy rays (radiation). Use IV drugs. What are the signs or symptoms? Skin that: Looks red or purple, or slightly darker than your usual skin color. Has streaks. Has spots. Is swollen. Is sore or painful when you touch it. Is warm. A fever. Chills. Blisters. Tiredness (fatigue). How is this treated? Medicines to treat infections or allergies. Rest. Placing cold or warm cloths on the skin. Staying in the hospital, if the condition is very bad. You may need medicines through an IV. Follow these instructions at home: Medicines Take over-the-counter and prescription medicines only as told by your doctor. If you were prescribed antibiotics, take them as told by your doctor. Do not stop using them even if you start to feel better. General instructions Drink enough fluid to keep your pee (urine) pale yellow. Do not touch or rub the infected area. Raise (elevate) the infected area above the level of your heart while you are sitting or lying down. Return to your normal activities when your doctor says that it is safe. Place cold or warm cloths on the area as told by your doctor. Keep all follow-up visits. Your doctor will need to make sure that a more serious infection is not developing. Contact a doctor if: You have a fever. You do not start to get better after 1-2 days of treatment. Your bone or joint under the infected area starts to hurt after the skin has healed. Your infection comes back in the same area or another area. Signs of this may include: You have a swollen bump in the area. Your red area gets larger, turns dark in color, or hurts more. You have more fluid coming from the wound. Pus or a bad smell develops in your infected area. You have more pain. You feel sick and have muscle aches and weakness. You develop vomiting or watery poop that will not go away. Get help right away if: You see red streaks coming from the  area. You notice the skin turns purple or black and falls off. These symptoms may be an emergency. Get help right away. Call 911. Do not wait to see if the symptoms will go away. Do not drive yourself to the hospital. This information is not intended to replace advice given to you by your health care provider. Make sure you discuss any questions you have with your health care provider. Document Revised: 06/23/2022 Document Reviewed: 06/23/2022 Elsevier Patient Education  2024 Elsevier Inc.   If you have been instructed to have an in-person evaluation today at a local Urgent Care facility, please use the link below. It will take you to a list of all of our available [facility], including address, phone number and hours of operation. Please do not delay care.  [facility]  If you or a family member do not have a primary care provider, use the link below to schedule  a visit and establish care. When you choose a [facility] primary care physician or advanced practice provider, you gain a long-term partner in health. Find a Primary Care Provider  Learn more about [facility]'s in-office and virtual care options: [facility] - Get Care Now

## 2024-05-10 NOTE — Progress Notes (Signed)
 Virtual Visit Consent   William Frey, you are scheduled for a virtual visit with a [facility] provider today. Just as with appointments in the office, your consent must be obtained to participate. Your consent will be active for this visit and any virtual visit you may have with one of our providers in the next 365 days. If you have a MyChart account, a copy of this consent can be sent to you electronically.  As this is a virtual visit, video technology does not allow for your provider to perform a traditional examination. This may limit your provider's ability to fully assess your condition. If your provider identifies any concerns that need to be evaluated in person or the need to arrange testing (such as labs, EKG, etc.), we will make arrangements to do so. Although advances in technology are sophisticated, we cannot ensure that it will always work on either your end or our end. If the connection with a video visit is poor, the visit may have to be switched to a telephone visit. With either a video or telephone visit, we are not always able to ensure that we have a secure connection.  By engaging in this virtual visit, you consent to the provision of healthcare and authorize for your insurance to be billed (if applicable) for the services provided during this visit. Depending on your insurance coverage, you may receive a charge related to this service.  I need to obtain your verbal consent now. Are you willing to proceed with your visit today? [name] has provided verbal consent on 05/10/2024 for a virtual visit (video or telephone). [name], NP  Date: 05/10/2024 1:51 PM   Virtual Visit via Video Note   I, [name], connected with  William Frey  ([id number], October 16, 1971) on 05/10/24 at  1:45 PM EDT by a video-enabled telemedicine application and verified that I am speaking with the correct person using two identifiers.  Location: Patient: Virtual Visit Location  Patient: Home Provider: Virtual Visit Location Provider: Home Office   I discussed the limitations of evaluation and management by telemedicine and the availability of in person appointments. The patient expressed understanding and agreed to proceed.    History of Present Illness: William Frey is a 53 y.o. who identifies as a male who was assigned male at birth, and is being seen today for skin infection on left lateral forearm- has a spot that looks to become infected.  Tender, warm and red, some slightly swelling.  Has a lot of allergies- plastics and got exposed to an allergen in the last week. Was using mupirocin  on it- twice, but it is not helping much.  Denies fever, chills, chest, SHOB.   Problems:  Patient Active Problem List   Diagnosis Date Noted   Former smoker 04/06/2023   Arthritis of right hip 07/20/2022   Strain of muscle of right hip 07/20/2022   Trochanteric bursitis of right hip 07/20/2022   Insomnia 12/27/2020   Vitamin D  deficiency 10/28/2020   Neuropathy 06/28/2020   Hypogonadism in male 06/28/2020   Proteinuria 11/12/2018   Anxiety and depression 11/30/2017   Angiokeratoma of [place] 12/24/2016   Essential hypertension 11/15/2016   Benign localized hyperplasia of prostate with urinary obstruction 05/15/2016   Hyperlipidemia 12/23/2015   History of nephrolithiasis 11/03/2015   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   Type 2 diabetes mellitus with hyperglycemia, without long-term current use of insulin (HCC) 09/23/2015   Preventative health care 09/18/2015   Obesity (  BMI 30-39.9) 09/18/2015    Allergies:  Allergies  Allergen Reactions   Penicillins Hives    Childhood allergy  Has patient had a PCN reaction causing immediate rash, facial/tongue/throat swelling, SOB or lightheadedness with hypotension: No Has patient had a PCN reaction causing severe rash involving mucus membranes or skin necrosis: No Has patient had a PCN reaction that required hospitalization:  No Has patient had a PCN reaction occurring within the last 10 years: No If all of the above answers are NO, then may proceed with Cephalosporin use.    Medications:  Current Outpatient Medications:    albuterol  (VENTOLIN  HFA) 108 (90 Base) MCG/ACT inhaler, Inhale 1-2 puffs into the lungs every 6 (six) hours as needed., Disp: 18 g, Rfl: 0   Blood Glucose Monitoring Suppl DEVI, 1 each by Does not apply route in the morning, at noon, and at bedtime. May substitute to any manufacturer covered by patient's insurance., Disp: 1 each, Rfl: 0   cyclobenzaprine  (FLEXERIL ) 10 MG tablet, Take 1 tablet (10 mg total) by mouth 3 (three) times daily as needed for muscle spasms., Disp: 30 tablet, Rfl: 1   Glucose Blood (BLOOD GLUCOSE TEST STRIPS) STRP, 1 each by In Vitro route in the morning, at noon, and at bedtime. May substitute to any manufacturer covered by patient's insurance., Disp: 100 strip, Rfl: 1   Lancet Device MISC, 1 each by Does not apply route in the morning, at noon, and at bedtime. May substitute to any manufacturer covered by patient's insurance., Disp: 1 each, Rfl: 0   Lancets (ONETOUCH ULTRASOFT) lancets, Use as instructed, Disp: 100 each, Rfl: 12   Lancets Misc. MISC, 1 each by Does not apply route in the morning, at noon, and at bedtime. May substitute to any manufacturer covered by patient's insurance., Disp: 100 each, Rfl: 2   Semaglutide , 1 MG/DOSE, (OZEMPIC , 1 MG/DOSE,) 4 MG/3ML SOPN, Inject 1 mg into the skin once a week., Disp: 3 mL, Rfl: 4   Testosterone  1.62 % GEL, Apply 4 Pump topically daily. PLACE 4 PUMPS ONTO THE SKIN DAILY, Disp: 150 g, Rfl: 0  Observations/Objective: Patient is well-developed, well-nourished in no acute distress.  Resting comfortably at home.  Head is normocephalic, atraumatic.  No labored breathing.  Speech is clear and coherent with logical content.  Patient is alert and oriented at baseline.  Red, closed, raised bump and redness surrounding tissues  of lateral left forearm- posterior aspect    Assessment and Plan:   1. Skin infection (Primary)  - doxycycline  (VIBRA -TABS) 100 MG tablet; Take 1 tablet (100 mg total) by mouth 2 (two) times daily for 7 days.  Dispense: 14 tablet; Refill: 0   -keep area clean and dry -do not pick the site -complete the full course of medication   -follow up in person if it is not improving  Reviewed side effects, risks and benefits of medication.    Patient acknowledged agreement and understanding of the plan.   Past Medical, Surgical, Social History, Allergies, and Medications have been Reviewed.    Follow Up Instructions: I discussed the assessment and treatment plan with the patient. The patient was provided an opportunity to ask questions and all were answered. The patient agreed with the plan and demonstrated an understanding of the instructions.  A copy of instructions were sent to the patient via MyChart unless otherwise noted below.    The patient was advised to call back or seek an in-person evaluation if the symptoms worsen or if the condition  fails to improve as anticipated.    [name], NP

## 2024-05-22 LAB — HM DIABETES EYE EXAM

## 2024-06-21 ENCOUNTER — Encounter: Admitting: Primary Care

## 2024-06-21 ENCOUNTER — Encounter: Payer: Self-pay | Admitting: Primary Care

## 2024-06-21 ENCOUNTER — Ambulatory Visit (INDEPENDENT_AMBULATORY_CARE_PROVIDER_SITE_OTHER): Admitting: Primary Care

## 2024-06-21 VITALS — BP 132/82 | HR 85 | Temp 97.2°F | Ht 72.84 in | Wt 237.0 lb

## 2024-06-21 DIAGNOSIS — Z87891 Personal history of nicotine dependence: Secondary | ICD-10-CM | POA: Diagnosis not present

## 2024-06-21 DIAGNOSIS — L989 Disorder of the skin and subcutaneous tissue, unspecified: Secondary | ICD-10-CM | POA: Diagnosis not present

## 2024-06-21 DIAGNOSIS — I10 Essential (primary) hypertension: Secondary | ICD-10-CM

## 2024-06-21 DIAGNOSIS — Z0001 Encounter for general adult medical examination with abnormal findings: Secondary | ICD-10-CM | POA: Diagnosis not present

## 2024-06-21 DIAGNOSIS — E1165 Type 2 diabetes mellitus with hyperglycemia: Secondary | ICD-10-CM

## 2024-06-21 DIAGNOSIS — Z125 Encounter for screening for malignant neoplasm of prostate: Secondary | ICD-10-CM | POA: Diagnosis not present

## 2024-06-21 DIAGNOSIS — E785 Hyperlipidemia, unspecified: Secondary | ICD-10-CM

## 2024-06-21 DIAGNOSIS — Z809 Family history of malignant neoplasm, unspecified: Secondary | ICD-10-CM | POA: Diagnosis not present

## 2024-06-21 DIAGNOSIS — F419 Anxiety disorder, unspecified: Secondary | ICD-10-CM

## 2024-06-21 DIAGNOSIS — F32A Depression, unspecified: Secondary | ICD-10-CM | POA: Diagnosis not present

## 2024-06-21 DIAGNOSIS — Z7985 Long-term (current) use of injectable non-insulin antidiabetic drugs: Secondary | ICD-10-CM | POA: Diagnosis not present

## 2024-06-21 MED ORDER — OZEMPIC (1 MG/DOSE) 4 MG/3ML ~~LOC~~ SOPN: 1.0000 mg | PEN_INJECTOR | SUBCUTANEOUS | 0 refills | Status: DC

## 2024-06-21 NOTE — Assessment & Plan Note (Signed)
 Stable.  Continue off treatment. Continue to monitor.

## 2024-06-21 NOTE — Assessment & Plan Note (Signed)
 Repeat A1C pending.  Continue Ozepmic 1 mg weekly. Urine microalbumin pending.  Follow up in 3-6 months based on A1C result.

## 2024-06-21 NOTE — Assessment & Plan Note (Signed)
 Controlled.  No concerns today. Continue to monitor.

## 2024-06-21 NOTE — Progress Notes (Signed)
 Subjective:    Patient ID: William Frey, male    DOB: 15-Apr-1971, 53 y.o.   MRN: [id number]  Medication Refill Pertinent negatives include no arthralgias, chest pain, coughing, headaches, myalgias, numbness or rash.    William Frey is a very pleasant 53 y.o. male who presents today for complete physical and follow up of chronic conditions.  He would also like to discuss a skin masses. Chronic to his parietal lobe for years. The spots will become agitated while combing his hair. There is one in particular that continues to bother him and has not healed.  He was once a patient of [facility], has not been seen in 2 years.   Immunizations: -Tetanus: Completed in > 10 years ago  -Shingles: Never completed, declines  -Pneumonia: Declines   Diet: Fair diet.  Exercise: No regular exercise.  Eye exam: Completes annually  Dental exam: Completes semi-annually    Colonoscopy: Never completed, declines.  Lung Cancer Screening: Completed in September 2024  PSA: UTD, follows with Urology   BP Readings from Last 3 Encounters:  06/21/24 132/82  03/20/24 118/80  03/09/24 120/82   Wt Readings from Last 3 Encounters:  06/21/24 237 lb (107.5 kg)  03/20/24 235 lb (106.6 kg)  03/09/24 238 lb (108 kg)        Review of Systems  Constitutional:  Negative for unexpected weight change.  HENT:  Negative for rhinorrhea.   Respiratory:  Negative for cough and shortness of breath.   Cardiovascular:  Negative for chest pain.  Gastrointestinal:  Negative for constipation and diarrhea.  Genitourinary:  Negative for difficulty urinating.  Musculoskeletal:  Negative for arthralgias and myalgias.  Skin:  Negative for rash.  Allergic/Immunologic: Negative for environmental allergies.  Neurological:  Negative for dizziness, numbness and headaches.         Past Medical History:  Diagnosis Date   Allergy     Anxiety    Arthritis    Asthma    Chicken pox     Complication of anesthesia    difficult to wake up   COVID-19    11/26/20 sp MAB infusion   Detachment of glenoid labrum 07/05/2020   Diabetes mellitus without complication (HCC)    Family history of adverse reaction to anesthesia    father had low tolerance for anesthesia   GERD (gastroesophageal reflux disease)    History of COVID-19 02/10/2021   History of kidney stones    Hyperlipidemia    Hypertension    Low testosterone     Lumbar sprain 07/05/2020   Migraines    Neck sprain 07/05/2020   Nephrolithiasis 11/12/2018   Pain in both feet 06/01/2022   Panic attack 12/27/2020   Scalp lesion 11/12/2022    Social History   Socioeconomic History   Marital status: Married    Spouse name: Not on file   Number of children: Not on file   Years of education: Not on file   Highest education level: Not on file  Occupational History   Not on file  Tobacco Use   Smoking status: Former    Current packs/day: 0.00    Types: Cigarettes    Quit date: 09/17/2013    Years since quitting: 10.7    Passive exposure: Past   Smokeless tobacco: Never   Tobacco comments:    Patient smoked on and off from 1989 to 2015. Patient has smoked a total of 14 years and averaged 1.5 packs per day = 21 pack year history.  Vaping Use   Vaping status: Never Used  Substance and Sexual Activity   Alcohol use: No    Alcohol/week: 0.0 standard drinks of alcohol    Comment: former   Drug use: No   Sexual activity: Yes  Other Topics Concern   Not on file  Social History Narrative   Married          Social Drivers of Health   Financial Resource Strain: Not on file  Food Insecurity: Not on file  Transportation Needs: Not on file  Physical Activity: Not on file  Stress: Not on file  Social Connections: Not on file  Intimate Partner Violence: Not on file    Past Surgical History:  Procedure Laterality Date   CYSTOSCOPY W/ RETROGRADES Right 10/28/2018   Procedure: CYSTOSCOPY WITH RETROGRADE  PYELOGRAM;  Surgeon: Francisca Redell BROCKS, MD;  Location: [facility] ORS;  Service: Urology;  Laterality: Right;   CYSTOSCOPY/URETEROSCOPY/HOLMIUM LASER/STENT PLACEMENT Right 10/28/2018   Procedure: CYSTOSCOPY/URETEROSCOPY/HOLMIUM LASER/STENT PLACEMENT;  Surgeon: Francisca Redell BROCKS, MD;  Location: [facility] ORS;  Service: Urology;  Laterality: Right;   OSTECTOMY METATARSAL Right 2003    Family History  Problem Relation Age of Onset   Arthritis Mother    Breast cancer Mother        breast and bone   Heart Problems Mother    Arthritis Father    Lymphoma Father    Heart disease Father    Stroke Father    Hypertension Father    Clotting disorder Father    Lung cancer Brother    Heart disease Brother        atriall fibrillation   Diabetes Brother    Heart disease Brother        atrial fibrillation   Diabetes Maternal Aunt    Diabetes Maternal Aunt     Allergies  Allergen Reactions   Penicillins Hives    Childhood allergy  Has patient had a PCN reaction causing immediate rash, facial/tongue/throat swelling, SOB or lightheadedness with hypotension: No Has patient had a PCN reaction causing severe rash involving mucus membranes or skin necrosis: No Has patient had a PCN reaction that required hospitalization: No Has patient had a PCN reaction occurring within the last 10 years: No If all of the above answers are NO, then may proceed with Cephalosporin use.     Current Outpatient Medications on File Prior to Visit  Medication Sig Dispense Refill   albuterol  (VENTOLIN  HFA) 108 (90 Base) MCG/ACT inhaler Inhale 1-2 puffs into the lungs every 6 (six) hours as needed. 18 g 0   Blood Glucose Monitoring Suppl DEVI 1 each by Does not apply route in the morning, at noon, and at bedtime. May substitute to any manufacturer covered by patient's insurance. 1 each 0   cyclobenzaprine  (FLEXERIL ) 10 MG tablet Take 1 tablet (10 mg total) by mouth 3 (three) times daily as needed for muscle spasms. 30 tablet 1    Glucose Blood (BLOOD GLUCOSE TEST STRIPS) STRP 1 each by In Vitro route in the morning, at noon, and at bedtime. May substitute to any manufacturer covered by patient's insurance. 100 strip 1   Lancet Device MISC 1 each by Does not apply route in the morning, at noon, and at bedtime. May substitute to any manufacturer covered by patient's insurance. 1 each 0   Lancets (ONETOUCH ULTRASOFT) lancets Use as instructed 100 each 12   Lancets Misc. MISC 1 each by Does not apply route in the morning, at noon, and at bedtime.  May substitute to any manufacturer covered by patient's insurance. 100 each 2   Testosterone  1.62 % GEL Apply 4 Pump topically daily. PLACE 4 PUMPS ONTO THE SKIN DAILY 150 g 0   No current facility-administered medications on file prior to visit.    BP 132/82   Pulse 85   Temp (!) 97.2 F (36.2 C) (Temporal)   Ht 6' 0.84 (1.85 m)   Wt 237 lb (107.5 kg)   SpO2 97%   BMI 31.41 kg/m  Objective:   Physical Exam HENT:     Right Ear: Tympanic membrane and ear canal normal.     Left Ear: Tympanic membrane and ear canal normal.  Eyes:     Pupils: Pupils are equal, round, and reactive to light.  Cardiovascular:     Rate and Rhythm: Normal rate and regular rhythm.  Pulmonary:     Effort: Pulmonary effort is normal.     Breath sounds: Normal breath sounds.  Abdominal:     General: Bowel sounds are normal.     Palpations: Abdomen is soft.     Tenderness: There is no abdominal tenderness.  Musculoskeletal:        General: Normal range of motion.     Cervical back: Neck supple.  Skin:    General: Skin is warm and dry.     Comments: 0.5 cm open sore to upper parietal lobe, slightly cratered.   Neurological:     Mental Status: He is alert and oriented to person, place, and time.     Cranial Nerves: No cranial nerve deficit.     Deep Tendon Reflexes:     Reflex Scores:      Patellar reflexes are 2+ on the right side and 2+ on the left side. Psychiatric:        Mood and  Affect: Mood normal.           Assessment & Plan:  Encounter for annual general medical examination with abnormal findings in adult Assessment & Plan: Declines all vaccines.  Colonoscopy overdue, he declines colonscopy and Cologuard.  PSA due and pending.  Discussed the importance of a healthy diet and regular exercise in order for weight loss, and to reduce the risk of further co-morbidity.  Exam stable. Labs pending.  Follow up in 1 year for repeat physical.    Essential hypertension Assessment & Plan: Stable.  Continue off treatment. Continue to monitor.   Orders: -     Comprehensive metabolic panel with GFR -     CBC  Type 2 diabetes mellitus with hyperglycemia, without long-term current use of insulin (HCC) Assessment & Plan: Repeat A1C pending.  Continue Ozepmic 1 mg weekly. Urine microalbumin pending.  Follow up in 3-6 months based on A1C result.  Orders: -     Hemoglobin A1c -     Microalbumin / creatinine urine ratio -     Ozempic  (1 MG/DOSE); Inject 1 mg into the skin once a week. for diabetes.  Dispense: 9 mL; Refill: 0  Anxiety and depression Assessment & Plan: Controlled.  No concerns today. Continue to monitor.    Former smoker Assessment & Plan: Continue with lung cancer screening program.    Hyperlipidemia, unspecified hyperlipidemia type Assessment & Plan: Repeat lipid panel pending.  Discussed the importance of statin therapy in the setting of type 2 diabetes.  He declines today.   Await results.   Orders: -     Lipid panel  Screening for prostate cancer  Skin lesion  Assessment & Plan: Suspicious.  Referral placed to dermatology for further evaluation.  Orders: -     Ambulatory referral to Dermatology  Family history of cancer Assessment & Plan: In multiple different family members.  Recommended and offered genetic testing for cancer screening, he declines. We also discussed recommendations for colonoscopy, he  also declines.         Mistie Adney K Daud Cayer, NP

## 2024-06-21 NOTE — Assessment & Plan Note (Signed)
Continue with lung cancer screening program

## 2024-06-21 NOTE — Assessment & Plan Note (Signed)
 In multiple different family members.  Recommended and offered genetic testing for cancer screening, he declines. We also discussed recommendations for colonoscopy, he also declines.

## 2024-06-21 NOTE — Patient Instructions (Signed)
 Stop by the lab prior to leaving today. I will notify you of your results once received.   You will either be contacted via phone regarding your referral to dermatology, or you may receive a letter on your MyChart portal from our referral team with instructions for scheduling an appointment. Please let us  know if you have not been contacted by anyone within two weeks.  Please consider genetic cancer screening as discussed today.  Please consider statin therapy to prevent heart disease as discussed.  It was a pleasure to see you today!

## 2024-06-21 NOTE — Assessment & Plan Note (Signed)
 Suspicious.  Referral placed to dermatology for further evaluation.

## 2024-06-21 NOTE — Assessment & Plan Note (Signed)
 Declines all vaccines.  Colonoscopy overdue, he declines colonscopy and Cologuard.  PSA due and pending.  Discussed the importance of a healthy diet and regular exercise in order for weight loss, and to reduce the risk of further co-morbidity.  Exam stable. Labs pending.  Follow up in 1 year for repeat physical.

## 2024-06-21 NOTE — Assessment & Plan Note (Signed)
 Repeat lipid panel pending.  Discussed the importance of statin therapy in the setting of type 2 diabetes.  He declines today.   Await results.

## 2024-06-22 LAB — COMPREHENSIVE METABOLIC PANEL WITH GFR
ALT: 14 U/L (ref 0–53)
AST: 14 U/L (ref 0–37)
Albumin: 4 g/dL (ref 3.5–5.2)
Alkaline Phosphatase: 54 U/L (ref 39–117)
BUN: 14 mg/dL (ref 6–23)
CO2: 23 meq/L (ref 19–32)
Calcium: 9.3 mg/dL (ref 8.4–10.5)
Chloride: 105 meq/L (ref 96–112)
Creatinine, Ser: 1.18 mg/dL (ref 0.40–1.50)
GFR: 71.18 mL/min (ref >60.00)
Glucose, Bld: 138 mg/dL — ABNORMAL HIGH (ref 70–99)
Potassium: 4.2 meq/L (ref 3.5–5.1)
Sodium: 136 meq/L (ref 135–145)
Total Bilirubin: 0.4 mg/dL (ref 0.2–1.2)
Total Protein: 7.1 g/dL (ref 6.0–8.3)

## 2024-06-22 LAB — CBC
HCT: 42.7 % (ref 39.0–52.0)
Hemoglobin: 14.3 g/dL (ref 13.0–17.0)
MCHC: 33.6 g/dL (ref 30.0–36.0)
MCV: 89 fl (ref 78.0–100.0)
Platelets: 220 K/uL (ref 150.0–400.0)
RBC: 4.8 Mil/uL (ref 4.22–5.81)
RDW: 13.5 % (ref 11.5–15.5)
WBC: 7.8 K/uL (ref 4.0–10.5)

## 2024-06-22 LAB — LIPID PANEL
Cholesterol: 193 mg/dL (ref 0–200)
HDL: 33.6 mg/dL — ABNORMAL LOW (ref >39.00)
LDL Cholesterol: 92 mg/dL (ref 0–99)
NonHDL: 159.53
Total CHOL/HDL Ratio: 6
Triglycerides: 336 mg/dL — ABNORMAL HIGH (ref 0.0–149.0)
VLDL: 67.2 mg/dL — ABNORMAL HIGH (ref 0.0–40.0)

## 2024-06-22 LAB — HEMOGLOBIN A1C: Hgb A1c MFr Bld: 8.1 % — ABNORMAL HIGH (ref 4.6–6.5)

## 2024-06-22 LAB — MICROALBUMIN / CREATININE URINE RATIO
Creatinine,U: 224.2 mg/dL
Microalb Creat Ratio: 8.9 mg/g (ref 0.0–30.0)
Microalb, Ur: 2 mg/dL — ABNORMAL HIGH (ref 0.0–1.9)

## 2024-06-23 ENCOUNTER — Ambulatory Visit: Payer: Self-pay | Admitting: Primary Care

## 2024-06-23 DIAGNOSIS — E785 Hyperlipidemia, unspecified: Secondary | ICD-10-CM

## 2024-06-23 DIAGNOSIS — E1165 Type 2 diabetes mellitus with hyperglycemia: Secondary | ICD-10-CM

## 2024-06-28 MED ORDER — SEMAGLUTIDE (2 MG/DOSE) 8 MG/3ML ~~LOC~~ SOPN: 2.0000 mg | PEN_INJECTOR | SUBCUTANEOUS | 0 refills | Status: DC

## 2024-06-28 MED ORDER — ATORVASTATIN CALCIUM 10 MG PO TABS: 10.0000 mg | ORAL_TABLET | Freq: Every day | ORAL | 3 refills | Status: DC

## 2024-07-10 ENCOUNTER — Encounter (HOSPITAL_COMMUNITY): Payer: Self-pay

## 2024-07-10 ENCOUNTER — Observation Stay (HOSPITAL_COMMUNITY)
Admission: EM | Admit: 2024-07-10 | Discharge: 2024-07-11 | Disposition: A | Discharge status: Home / Self Care | Attending: Orthopedic Surgery | Admitting: Orthopedic Surgery

## 2024-07-10 ENCOUNTER — Other Ambulatory Visit: Payer: Self-pay

## 2024-07-10 ENCOUNTER — Emergency Department (HOSPITAL_COMMUNITY)

## 2024-07-10 DIAGNOSIS — I10 Essential (primary) hypertension: Secondary | ICD-10-CM | POA: Diagnosis not present

## 2024-07-10 DIAGNOSIS — E119 Type 2 diabetes mellitus without complications: Secondary | ICD-10-CM | POA: Diagnosis not present

## 2024-07-10 DIAGNOSIS — S8780XA Crushing injury of unspecified lower leg, initial encounter: Principal | ICD-10-CM | POA: Insufficient documentation

## 2024-07-10 DIAGNOSIS — M79672 Pain in left foot: Secondary | ICD-10-CM | POA: Diagnosis not present

## 2024-07-10 DIAGNOSIS — S82401A Unspecified fracture of shaft of right fibula, initial encounter for closed fracture: Principal | ICD-10-CM | POA: Diagnosis present

## 2024-07-10 DIAGNOSIS — S82425A Nondisplaced transverse fracture of shaft of left fibula, initial encounter for closed fracture: Secondary | ICD-10-CM | POA: Diagnosis not present

## 2024-07-10 DIAGNOSIS — S8782XA Crushing injury of left lower leg, initial encounter: Secondary | ICD-10-CM | POA: Diagnosis not present

## 2024-07-10 DIAGNOSIS — Z8781 Personal history of (healed) traumatic fracture: Secondary | ICD-10-CM

## 2024-07-10 DIAGNOSIS — M79671 Pain in right foot: Secondary | ICD-10-CM | POA: Diagnosis not present

## 2024-07-10 DIAGNOSIS — S82402A Unspecified fracture of shaft of left fibula, initial encounter for closed fracture: Secondary | ICD-10-CM | POA: Diagnosis not present

## 2024-07-10 DIAGNOSIS — G8911 Acute pain due to trauma: Secondary | ICD-10-CM | POA: Diagnosis not present

## 2024-07-10 DIAGNOSIS — M19072 Primary osteoarthritis, left ankle and foot: Secondary | ICD-10-CM | POA: Diagnosis not present

## 2024-07-10 DIAGNOSIS — M79605 Pain in left leg: Secondary | ICD-10-CM | POA: Diagnosis not present

## 2024-07-10 DIAGNOSIS — W230XXA Caught, crushed, jammed, or pinched between moving objects, initial encounter: Secondary | ICD-10-CM | POA: Insufficient documentation

## 2024-07-10 DIAGNOSIS — Z87891 Personal history of nicotine dependence: Secondary | ICD-10-CM | POA: Insufficient documentation

## 2024-07-10 DIAGNOSIS — J45909 Unspecified asthma, uncomplicated: Secondary | ICD-10-CM | POA: Insufficient documentation

## 2024-07-10 DIAGNOSIS — S82451A Displaced comminuted fracture of shaft of right fibula, initial encounter for closed fracture: Secondary | ICD-10-CM | POA: Diagnosis not present

## 2024-07-10 DIAGNOSIS — Z743 Need for continuous supervision: Secondary | ICD-10-CM | POA: Diagnosis not present

## 2024-07-10 DIAGNOSIS — T1490XA Injury, unspecified, initial encounter: Secondary | ICD-10-CM | POA: Diagnosis present

## 2024-07-10 LAB — COMPREHENSIVE METABOLIC PANEL WITH GFR
ALT: 17 U/L (ref 0–44)
AST: 20 U/L (ref 15–41)
Albumin: 3.7 g/dL (ref 3.5–5.0)
Alkaline Phosphatase: 52 U/L (ref 38–126)
Anion gap: 16 — ABNORMAL HIGH (ref 5–15)
BUN: 17 mg/dL (ref 6–20)
CO2: 19 mmol/L — ABNORMAL LOW (ref 22–32)
Calcium: 8.6 mg/dL — ABNORMAL LOW (ref 8.9–10.3)
Chloride: 103 mmol/L (ref 98–111)
Creatinine, Ser: 1.32 mg/dL — ABNORMAL HIGH (ref 0.61–1.24)
GFR, Estimated: >60 mL/min (ref >60)
Glucose, Bld: 228 mg/dL — ABNORMAL HIGH (ref 70–99)
Potassium: 3.9 mmol/L (ref 3.5–5.1)
Sodium: 138 mmol/L (ref 135–145)
Total Bilirubin: 0.7 mg/dL (ref 0.0–1.2)
Total Protein: 7.3 g/dL (ref 6.5–8.1)

## 2024-07-10 LAB — SAMPLE TO BLOOD BANK

## 2024-07-10 LAB — CBC
HCT: 42.8 % (ref 39.0–52.0)
Hemoglobin: 14.3 g/dL (ref 13.0–17.0)
MCH: 30.1 pg (ref 26.0–34.0)
MCHC: 33.4 g/dL (ref 30.0–36.0)
MCV: 90.1 fL (ref 80.0–100.0)
Platelets: 204 K/uL (ref 150–400)
RBC: 4.75 MIL/uL (ref 4.22–5.81)
RDW: 12 % (ref 11.5–15.5)
WBC: 7.8 K/uL (ref 4.0–10.5)
nRBC: 0 % (ref 0.0–0.2)

## 2024-07-10 LAB — I-STAT CHEM 8, ED
BUN: 20 mg/dL (ref 6–20)
Calcium, Ion: 1.09 mmol/L — ABNORMAL LOW (ref 1.15–1.40)
Chloride: 103 mmol/L (ref 98–111)
Creatinine, Ser: 1.3 mg/dL — ABNORMAL HIGH (ref 0.61–1.24)
Glucose, Bld: 227 mg/dL — ABNORMAL HIGH (ref 70–99)
HCT: 43 % (ref 39.0–52.0)
Hemoglobin: 14.6 g/dL (ref 13.0–17.0)
Potassium: 3.8 mmol/L (ref 3.5–5.1)
Sodium: 138 mmol/L (ref 135–145)
TCO2: 24 mmol/L (ref 22–32)

## 2024-07-10 LAB — GLUCOSE, CAPILLARY
Glucose-Capillary: 167 mg/dL — ABNORMAL HIGH (ref 70–99)
Glucose-Capillary: 148 mg/dL — ABNORMAL HIGH (ref 70–99)

## 2024-07-10 LAB — I-STAT CG4 LACTIC ACID, ED: Lactic Acid, Venous: 2.8 mmol/L (ref 0.5–1.9)

## 2024-07-10 LAB — ETHANOL: Alcohol, Ethyl (B): <15 mg/dL (ref <15)

## 2024-07-10 LAB — CK: Total CK: 163 U/L (ref 49–397)

## 2024-07-10 LAB — PROTIME-INR
INR: 1 (ref 0.8–1.2)
Prothrombin Time: 13.8 s (ref 11.4–15.2)

## 2024-07-10 MED ORDER — DOCUSATE SODIUM 100 MG PO CAPS
100.0000 mg | ORAL_CAPSULE | Freq: Two times a day (BID) | ORAL | Status: DC
Start: 2024-07-10 — End: 2024-07-11
  Administered 2024-07-10 – 2024-07-11 (×2): 100 mg via ORAL
  Filled 2024-07-10 (×2): qty 1

## 2024-07-10 MED ORDER — HYDROMORPHONE HCL 1 MG/ML IJ SOLN
1.0000 mg | Freq: Once | INTRAMUSCULAR | Status: AC
  Administered 2024-07-10: 1 mg via INTRAVENOUS
  Filled 2024-07-10: qty 1

## 2024-07-10 MED ORDER — CLINDAMYCIN PHOSPHATE 900 MG/50ML IV SOLN
900.0000 mg | Freq: Once | INTRAVENOUS | Status: AC
  Administered 2024-07-10: 900 mg via INTRAVENOUS
  Filled 2024-07-10: qty 50

## 2024-07-10 MED ORDER — OXYCODONE HCL 5 MG PO TABS: 5.0000 mg | ORAL_TABLET | ORAL | Status: DC | PRN

## 2024-07-10 MED ORDER — OXYCODONE HCL 5 MG PO TABS
2.5000 mg | ORAL_TABLET | ORAL | Status: DC | PRN
  Administered 2024-07-10 – 2024-07-11 (×5): 5 mg via ORAL
  Filled 2024-07-10 (×5): qty 1

## 2024-07-10 MED ORDER — SENNA 8.6 MG PO TABS
1.0000 | ORAL_TABLET | Freq: Two times a day (BID) | ORAL | Status: DC
  Administered 2024-07-11: 8.6 mg via ORAL
  Filled 2024-07-10 (×2): qty 1

## 2024-07-10 MED ORDER — CELECOXIB 200 MG PO CAPS: 200.0000 mg | ORAL_CAPSULE | Freq: Two times a day (BID) | ORAL | Status: DC

## 2024-07-10 MED ORDER — HYDROMORPHONE HCL 1 MG/ML IJ SOLN
0.5000 mg | INTRAMUSCULAR | Status: DC | PRN
  Administered 2024-07-10 – 2024-07-11 (×3): 0.5 mg via INTRAVENOUS
  Filled 2024-07-10 (×3): qty 0.5

## 2024-07-10 MED ORDER — ONDANSETRON HCL 4 MG/2ML IJ SOLN: 4.0000 mg | Freq: Four times a day (QID) | INTRAMUSCULAR | Status: DC | PRN

## 2024-07-10 MED ORDER — TETANUS-DIPHTH-ACELL PERTUSSIS 5-2.5-18.5 LF-MCG/0.5 IM SUSY
0.5000 mL | PREFILLED_SYRINGE | Freq: Once | INTRAMUSCULAR | Status: AC
  Administered 2024-07-10: 0.5 mL via INTRAMUSCULAR
  Filled 2024-07-10: qty 0.5

## 2024-07-10 MED ORDER — ONDANSETRON HCL 4 MG PO TABS: 4.0000 mg | ORAL_TABLET | Freq: Four times a day (QID) | ORAL | Status: DC | PRN

## 2024-07-10 MED ORDER — ACETAMINOPHEN 500 MG PO TABS
1000.0000 mg | ORAL_TABLET | Freq: Four times a day (QID) | ORAL | Status: DC
  Administered 2024-07-10 – 2024-07-11 (×3): 1000 mg via ORAL
  Filled 2024-07-10 (×3): qty 2

## 2024-07-10 NOTE — Progress Notes (Signed)
 Pt admitted to 6N03 from ED with family at bedside.  Pt alert and oriented x 4.  Pt has bilateral legs in soft cast and wrapped in ace wrap.  Positioned for comfort.

## 2024-07-10 NOTE — ED Notes (Signed)
 Hourly rounding complete. Pt alert or resting, no distress noted, offered toileting and diet as appropriate. Side rails up, call light within reach. Pt denies pain or further needs at this time.

## 2024-07-10 NOTE — ED Provider Notes (Signed)
 [place] EMERGENCY DEPARTMENT AT [facility] Provider Note   CSN: [id number] Arrival date & time: 07/10/24  1001     Patient presents with: Trauma   KEVIS QU is a 53 y.o. male.   The history is provided by the patient, the EMS personnel and medical records. No language interpreter was used.  Trauma Mechanism of injury: Crush injury Injury location: leg Injury location detail: L lower leg, R lower leg, L foot and R foot Incident location: around machinery Arrived directly from scene: yes  Crush injury:      Mechanism: industrial machinery   Current symptoms:      Associated symptoms:            Denies back pain, chest pain, headache, nausea, neck pain and vomiting.      Prior to Admission medications   Medication Sig Start Date End Date Taking? Authorizing Provider  albuterol  (VENTOLIN  HFA) 108 (90 Base) MCG/ACT inhaler Inhale 1-2 puffs into the lungs every 6 (six) hours as needed. 11/11/23   Corlis Burnard DEL, NP  atorvastatin  (LIPITOR) 10 MG tablet Take 1 tablet (10 mg total) by mouth daily. for cholesterol. 06/28/24   Clark, Katherine K, NP  Blood Glucose Monitoring Suppl DEVI 1 each by Does not apply route in the morning, at noon, and at bedtime. May substitute to any manufacturer covered by patient's insurance. 04/06/23   Maribeth Camellia MATSU, MD  cyclobenzaprine  (FLEXERIL ) 10 MG tablet Take 1 tablet (10 mg total) by mouth 3 (three) times daily as needed for muscle spasms. 03/09/24   Corey, Evan S, MD  Glucose Blood (BLOOD GLUCOSE TEST STRIPS) STRP 1 each by In Vitro route in the morning, at noon, and at bedtime. May substitute to any manufacturer covered by patient's insurance. 04/06/23   Maribeth Camellia MATSU, MD  Lancet Device MISC 1 each by Does not apply route in the morning, at noon, and at bedtime. May substitute to any manufacturer covered by patient's insurance. 04/06/23   Maribeth Camellia MATSU, MD  Lancets [facility] ULTRASOFT) lancets Use as instructed 09/23/15    Cook, Jayce G, DO  Lancets Misc. MISC 1 each by Does not apply route in the morning, at noon, and at bedtime. May substitute to any manufacturer covered by patient's insurance. 04/06/23   Maribeth Camellia MATSU, MD  Semaglutide , 2 MG/DOSE, 8 MG/3ML SOPN Inject 2 mg as directed once a week. for diabetes. 06/28/24   Clark, Katherine K, NP  Testosterone  1.62 % GEL Apply 4 Pump topically daily. PLACE 4 PUMPS ONTO THE SKIN DAILY 12/31/23   Vaillancourt, Samantha, PA-C    Allergies: Penicillins    Review of Systems  Constitutional:  Negative for chills, fatigue and fever.  HENT:  Negative for congestion.   Respiratory:  Negative for cough, chest tightness, shortness of breath and wheezing.   Cardiovascular:  Positive for leg swelling. Negative for chest pain and palpitations.  Gastrointestinal:  Negative for blood in stool, diarrhea, nausea and vomiting.  Genitourinary:  Negative for dysuria.  Musculoskeletal:  Negative for back pain, neck pain and neck stiffness.  Skin:  Positive for wound.  Neurological:  Negative for headaches.  Psychiatric/Behavioral:  Negative for agitation and confusion.   All other systems reviewed and are negative.   Updated Vital Signs BP (!) 160/100   Pulse (!) 102   Temp 97.8 F (36.6 C) (Temporal)   Resp 15   Ht 6' 1 (1.854 m)   Wt 106.6 kg   SpO2 100%  BMI 31.00 kg/m   Physical Exam Vitals and nursing note reviewed.  Constitutional:      General: He is not in acute distress.    Appearance: He is well-developed. He is ill-appearing. He is not toxic-appearing or diaphoretic.  HENT:     Head: Normocephalic and atraumatic.     Nose: No congestion or rhinorrhea.     Mouth/Throat:     Mouth: Mucous membranes are moist.     Pharynx: No oropharyngeal exudate or posterior oropharyngeal erythema.  Eyes:     Extraocular Movements: Extraocular movements intact.     Conjunctiva/sclera: Conjunctivae normal.     Pupils: Pupils are equal, round, and reactive to  light.  Cardiovascular:     Rate and Rhythm: Normal rate and regular rhythm.     Heart sounds: No murmur heard. Pulmonary:     Effort: Pulmonary effort is normal. No respiratory distress.     Breath sounds: Normal breath sounds. No wheezing, rhonchi or rales.  Chest:     Chest wall: No tenderness.  Abdominal:     General: Abdomen is flat.     Palpations: Abdomen is soft.     Tenderness: There is no abdominal tenderness.  Musculoskeletal:        General: Swelling, tenderness and signs of injury present.     Cervical back: Neck supple.     Right lower leg: Tenderness present.     Left lower leg: Tenderness present.       Legs:     Comments: Tenderness to bilateral calves and shins.  Abrasion/skin tear to left shin.  Some scrapes on the right leg.  Distally patient had intact DP and PT pulse in both legs.  Intact sensation and strength in the feet.  Skin:    General: Skin is warm and dry.     Capillary Refill: Capillary refill takes less than 2 seconds.     Findings: Bruising present.  Neurological:     General: No focal deficit present.     Mental Status: He is alert.     Sensory: No sensory deficit.     Motor: No weakness.  Psychiatric:        Mood and Affect: Mood normal.     (all labs ordered are listed, but only abnormal results are displayed) Labs Reviewed  COMPREHENSIVE METABOLIC PANEL WITH GFR - Abnormal; Notable for the following components:      Result Value   CO2 19 (*)    Glucose, Bld 228 (*)    Creatinine, Ser 1.32 (*)    Calcium  8.6 (*)    Anion gap 16 (*)    All other components within normal limits  I-STAT CHEM 8, ED - Abnormal; Notable for the following components:   Creatinine, Ser 1.30 (*)    Glucose, Bld 227 (*)    Calcium , Ion 1.09 (*)    All other components within normal limits  I-STAT CG4 LACTIC ACID, ED - Abnormal; Notable for the following components:   Lactic Acid, Venous 2.8 (*)    All other components within normal limits  CBC  ETHANOL   PROTIME-INR  CK  URINALYSIS, ROUTINE W REFLEX MICROSCOPIC  SAMPLE TO BLOOD BANK    EKG: None  Radiology: DG Foot Complete Left Result Date: 07/10/2024 CLINICAL DATA:  53 year old male crush injury between building and machine. Pain. EXAM: LEFT FOOT - COMPLETE 3+ VIEW COMPARISON:  Left ankle series 04/26/2005. FINDINGS: Three views. Bone mineralization is within normal limits. 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 Calcaneus stable and intact. No acute osseous abnormality identified. No discrete soft tissue injury. IMPRESSION: No acute fracture or dislocation identified about the left foot. Electronically Signed   By: VEAR Hurst M.D.   On: 07/10/2024 11:20   DG Foot Complete Right Result Date: 07/10/2024 CLINICAL DATA:  53 year old male crush injury between building and machine. Pain. EXAM: RIGHT FOOT COMPLETE - 3+ VIEW COMPARISON:  Right foot series 05/16/2009. FINDINGS: Three views including cross-table lateral. Chronic deformities of the distal right 4th and 5th metatarsals were present in 2010. Associated MTP angulation, more so at the 5th joint. Underlying bone mineralization appears normal. No acute fracture or dislocation identified. IMPRESSION: No acute fracture or dislocation identified about the right foot. Chronic deformities of the distal right 4th and 5th metatarsals. Electronically Signed   By: VEAR Hurst M.D.   On: 07/10/2024 11:19   DG Tibia/Fibula Left Port Result Date: 07/10/2024 CLINICAL DATA:  53 year old male crush injury between building and machine. Pain. EXAM: PORTABLE LEFT TIBIA AND FIBULA - 2 VIEW COMPARISON:  Contralateral right tib fib series today. FINDINGS: Four views 1029 hours. Bone mineralization is within normal limits. Maintained alignment at the left knee and ankle. No knee joint effusion on cross-table lateral view. Nondisplaced transverse fracture mid left fibula shaft, only readily apparent on image #3. Left tibia appears to remain intact. No other acute osseous  abnormality identified. IMPRESSION: Nondisplaced transverse fracture of the mid Left fibula shaft. Electronically Signed   By: VEAR Hurst M.D.   On: 07/10/2024 11:17   DG Tibia/Fibula Right Port Result Date: 07/10/2024 CLINICAL DATA:  53 year old male crush injury between building and machine. Pain. EXAM: PORTABLE RIGHT TIBIA AND FIBULA - 2 VIEW COMPARISON:  None Available. FINDINGS: Four views 1019 hours. Maintained alignment at the right knee and ankle. Comminuted mid right fibula shaft fracture with 1/2 shaft width medial displacement. No significant anterior or posterior displacement. Right tibia appears to remain intact. No other acute osseous abnormality identified. IMPRESSION: Comminuted mid right fibula shaft fracture with 1/2 shaft width medial displacement. Electronically Signed   By: VEAR Hurst M.D.   On: 07/10/2024 11:16     Procedures   Medications Ordered in the ED  HYDROmorphone  (DILAUDID ) injection 1 mg (1 mg Intravenous Given 07/10/24 1010)  clindamycin  (CLEOCIN ) IVPB 900 mg (0 mg Intravenous Stopped 07/10/24 1151)  HYDROmorphone  (DILAUDID ) injection 1 mg (1 mg Intravenous Given 07/10/24 1156)  Tdap (BOOSTRIX ) injection 0.5 mL (0.5 mLs Intramuscular Given 07/10/24 1219)                                    Medical Decision Making Amount and/or Complexity of Data Reviewed Labs: ordered. Radiology: ordered.  Risk Prescription drug management. Decision regarding hospitalization.    KIYON FIDALGO is a 53 y.o. male with a past medical history significant for hypertension, hyperlipidemia, anxiety, depression, diabetes, asthma, GERD, and kidney stones who presents with lower extremity crush.  According to patient and EMS, patient was using a large machine that let him tow structures such as a shed and the patient was crushed between the machine and a trailer twice crushing his lower extremities with this multiple 1000 pound machine.  Patient says he did not hit his head neck chest back or  abdomen.  No pain in his hips but pain from his knees down bilaterally.  There is an open wound on the left shin  that was bleeding that is now hemostatic.  EMS reported severe pain but otherwise no evidence of other trauma.  On arrival, patient complaining of 20 5 out of 10 pain in lower extremities.  Left worse than right.  Patient does denies numbness or weakness but has significant pain.  He is unsure of his last tetanus shot but he is refusing tetanus shot at this time.  On exam, he has tenderness to both lower extremities from the knees down with a wound on the left shin and some scrapes on the right leg.  Distally he did have palpable DP and PT pulse and he has intact sensation.  He could wiggle the toes bilaterally.  Knees and hips nontender.  Rest of exam unremarkable.  Breath sounds equal bilaterally and airway was intact.  Patient will get portable x-rays of his lower extremities and will give pain medicine and get labs.  Anticipate disposition based on findings.  11:47 AM Leg x-rays reveal bilateral midshaft fibular fractures 1 being slightly displaced.  Patient still having severe pain.  Will give more pain medicine and call orthopedics.  Orthopedics was called and Dr. Emmit will admit for pain control and determination of a further plan.  He recommended a posterior L-type splint on both legs and admission to their service.  Will give more pain medicine for the discomfort.  Patient was amenable to getting a tetanus shot and he received some antibiotics initially when there was concern for possible open fracture.  It does not appear to show open fracture at this time.  Patient has more of a skin tear to his shin, will have his wound washed and dressed as it did not appear amenable to sutures on initial inspection.  He still has soft compartments and had intact sensation and strength and pulses distally.  Do not suspect compartment syndrome at this time.  He will be admitted by orthopedics  for further management.      Final diagnoses:  Crushing injury of lower leg, unspecified laterality, initial encounter  Closed fracture of both fibulae, initial encounter     Clinical Impression: 1. Crushing injury of lower leg, unspecified laterality, initial encounter   2. Closed fracture of both fibulae, initial encounter     Disposition: Admit  This note was prepared with assistance of Dragon voice recognition software. Occasional wrong-word or sound-a-like substitutions may have occurred due to the inherent limitations of voice recognition software.       Matilyn Fehrman, Lonni PARAS, MD 07/10/24 [phone]

## 2024-07-10 NOTE — H&P (Signed)
 William Frey is an 53 y.o. male.    Chief Complaint: Bilateral lower leg injuries  HPI: Pt is a 53 y.o. male William Frey is a 53 y.o. male with a past medical history significant for hypertension, hyperlipidemia, anxiety, depression, diabetes, asthma, GERD, and kidney stones who presents with lower extremity crush.  According to patient and EMS, patient was using a large machine that let him tow structures such as a shed and the patient was crushed between the machine and a trailer twice crushing his lower extremities with this multiple 1000 pound machine.  Patient says he did not hit his head neck chest back or abdomen.  No pain in his hips but pain from his knees down bilaterally.  There is an open wound on the left shin that was bleeding that is now hemostatic.  EMS reported severe pain but otherwise no evidence of other trauma.   He owns a towing business.  PCP:  Gretta Comer POUR, NP  D/C Plans: To be determined following appropriate treatment plan  PMH: Past Medical History:  Diagnosis Date   Allergy     Anxiety    Arthritis    Asthma    Chicken pox    Complication of anesthesia    difficult to wake up   COVID-19    11/26/20 sp MAB infusion   Detachment of glenoid labrum 07/05/2020   Diabetes mellitus without complication [facility])    Family history of adverse reaction to anesthesia    father had low tolerance for anesthesia   GERD (gastroesophageal reflux disease)    History of COVID-19 02/10/2021   History of kidney stones    Hyperlipidemia    Hypertension    Low testosterone     Lumbar sprain 07/05/2020   Migraines    Neck sprain 07/05/2020   Nephrolithiasis 11/12/2018   Pain in both feet 06/01/2022   Panic attack 12/27/2020   Scalp lesion 11/12/2022    PSH: Past Surgical History:  Procedure Laterality Date   CYSTOSCOPY W/ RETROGRADES Right 10/28/2018   Procedure: CYSTOSCOPY WITH RETROGRADE PYELOGRAM;  Surgeon: Francisca Redell BROCKS, MD;  Location: [facility] ORS;   Service: Urology;  Laterality: Right;   CYSTOSCOPY/URETEROSCOPY/HOLMIUM LASER/STENT PLACEMENT Right 10/28/2018   Procedure: CYSTOSCOPY/URETEROSCOPY/HOLMIUM LASER/STENT PLACEMENT;  Surgeon: Francisca Redell BROCKS, MD;  Location: [facility] ORS;  Service: Urology;  Laterality: Right;   OSTECTOMY METATARSAL Right 2003    Social History:  reports that he quit smoking about 10 years ago. His smoking use included cigarettes. He has been exposed to tobacco smoke. He has never used smokeless tobacco. He reports that he does not drink alcohol and does not use drugs.  Allergies:  Allergies  Allergen Reactions   Penicillins Hives    Childhood allergy  Has patient had a PCN reaction causing immediate rash, facial/tongue/throat swelling, SOB or lightheadedness with hypotension: No Has patient had a PCN reaction causing severe rash involving mucus membranes or skin necrosis: No Has patient had a PCN reaction that required hospitalization: No Has patient had a PCN reaction occurring within the last 10 years: No If all of the above answers are NO, then may proceed with Cephalosporin use.     Medications: Medications Prior to Admission  Medication Sig Dispense Refill   albuterol  (VENTOLIN  HFA) 108 (90 Base) MCG/ACT inhaler Inhale 1-2 puffs into the lungs every 6 (six) hours as needed. 18 g 0   Blood Glucose Monitoring Suppl DEVI 1 each by Does not apply route in the morning, at noon, and at  bedtime. May substitute to any manufacturer covered by patient's insurance. 1 each 0   Glucose Blood (BLOOD GLUCOSE TEST STRIPS) STRP 1 each by In Vitro route in the morning, at noon, and at bedtime. May substitute to any manufacturer covered by patient's insurance. 100 strip 1   Lancet Device MISC 1 each by Does not apply route in the morning, at noon, and at bedtime. May substitute to any manufacturer covered by patient's insurance. 1 each 0   Lancets (ONETOUCH ULTRASOFT) lancets Use as instructed 100 each 12   Lancets Misc.  MISC 1 each by Does not apply route in the morning, at noon, and at bedtime. May substitute to any manufacturer covered by patient's insurance. 100 each 2   Semaglutide , 2 MG/DOSE, 8 MG/3ML SOPN Inject 2 mg as directed once a week. for diabetes. 9 mL 0   Testosterone  1.62 % GEL Apply 4 Pump topically daily. PLACE 4 PUMPS ONTO THE SKIN DAILY 150 g 0   atorvastatin  (LIPITOR) 10 MG tablet Take 1 tablet (10 mg total) by mouth daily. for cholesterol. (Patient not taking: Reported on 07/10/2024) 90 tablet 3   cyclobenzaprine  (FLEXERIL ) 10 MG tablet Take 1 tablet (10 mg total) by mouth 3 (three) times daily as needed for muscle spasms. (Patient not taking: Reported on 07/10/2024) 30 tablet 1    Results for orders placed or performed during the hospital encounter of 07/10/24 (from the past 48 hours)  Comprehensive metabolic panel     Status: Abnormal   Collection Time: 07/10/24 10:10 AM  Result Value Ref Range   Sodium 138 135 - 145 mmol/L   Potassium 3.9 3.5 - 5.1 mmol/L   Chloride 103 98 - 111 mmol/L   CO2 19 (L) 22 - 32 mmol/L   Glucose, Bld 228 (H) 70 - 99 mg/dL    Comment: Glucose reference range applies only to samples taken after fasting for at least 8 hours.   BUN 17 6 - 20 mg/dL   Creatinine, Ser 8.67 (H) 0.61 - 1.24 mg/dL   Calcium  8.6 (L) 8.9 - 10.3 mg/dL   Total Protein 7.3 6.5 - 8.1 g/dL   Albumin 3.7 3.5 - 5.0 g/dL   AST 20 15 - 41 U/L   ALT 17 0 - 44 U/L   Alkaline Phosphatase 52 38 - 126 U/L   Total Bilirubin 0.7 0.0 - 1.2 mg/dL   GFR, Estimated >39 >39 mL/min    Comment: (NOTE) Calculated using the CKD-EPI Creatinine Equation (2021)    Anion gap 16 (H) 5 - 15    Comment: Performed at [facility], [address], [place], [place] [address]  CBC     Status: None   Collection Time: 07/10/24 10:10 AM  Result Value Ref Range   WBC 7.8 4.0 - 10.5 K/uL   RBC 4.75 4.22 - 5.81 MIL/uL   Hemoglobin 14.3 13.0 - 17.0 g/dL   HCT 57.1 60.9 - 47.9 %   MCV 90.1 80.0 - 100.0 fL    MCH 30.1 26.0 - 34.0 pg   MCHC 33.4 30.0 - 36.0 g/dL   RDW 87.9 88.4 - 84.4 %   Platelets 204 150 - 400 K/uL   nRBC 0.0 0.0 - 0.2 %    Comment: Performed at [facility], [address] [place], [place] [address]  Ethanol     Status: None   Collection Time: 07/10/24 10:10 AM  Result Value Ref Range   Alcohol, Ethyl (B) <15 <15  mg/dL    Comment: (NOTE) For medical purposes only. Performed at [facility], [address] [place], [place] [address]   Protime-INR     Status: None   Collection Time: 07/10/24 10:10 AM  Result Value Ref Range   Prothrombin Time 13.8 11.4 - 15.2 seconds   INR 1.0 0.8 - 1.2    Comment: (NOTE) INR goal varies based on device and disease states. Performed at [facility], [address] [place], [place] [address]   Sample to Blood Bank     Status: None   Collection Time: 07/10/24 10:10 AM  Result Value Ref Range   Blood Bank Specimen SAMPLE AVAILABLE FOR TESTING    Sample Expiration      07/13/2024,2359 Performed at [facility], [address] [place], [place] [address]   I-Stat Chem 8, ED     Status: Abnormal   Collection Time: 07/10/24 10:17 AM  Result Value Ref Range   Sodium 138 135 - 145 mmol/L   Potassium 3.8 3.5 - 5.1 mmol/L   Chloride 103 98 - 111 mmol/L   BUN 20 6 - 20 mg/dL   Creatinine, Ser 8.69 (H) 0.61 - 1.24 mg/dL   Glucose, Bld 772 (H) 70 - 99 mg/dL    Comment: Glucose reference range applies only to samples taken after fasting for at least 8 hours.   Calcium , Ion 1.09 (L) 1.15 - 1.40 mmol/L   TCO2 24 22 - 32 mmol/L   Hemoglobin 14.6 13.0 - 17.0 g/dL   HCT 56.9 60.9 - 47.9 %  CK     Status: None   Collection Time: 07/10/24 10:19 AM  Result Value Ref Range   Total CK 163 49 - 397 U/L    Comment: Performed at [facility], [address], [place], [place] [address]  I-Stat Lactic Acid, ED     Status: Abnormal   Collection Time: 07/10/24 10:21 AM  Result Value Ref Range   Lactic Acid, Venous  2.8 (HH) 0.5 - 1.9 mmol/L   Comment NOTIFIED PHYSICIAN   Glucose, capillary     Status: Abnormal   Collection Time: 07/10/24  3:30 PM  Result Value Ref Range   Glucose-Capillary 167 (H) 70 - 99 mg/dL    Comment: Glucose reference range applies only to samples taken after fasting for at least 8 hours.   DG Foot Complete Left Result Date: 07/10/2024 CLINICAL DATA:  53 year old male crush injury between building and machine. Pain. EXAM: LEFT FOOT - COMPLETE 3+ VIEW COMPARISON:  Left ankle series 04/26/2005. FINDINGS: Three views. Bone mineralization is within normal limits. 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 Calcaneus stable and intact. No acute osseous abnormality identified. No discrete soft tissue injury. IMPRESSION: No acute fracture or dislocation identified about the left foot. Electronically Signed   By: VEAR Hurst M.D.   On: 07/10/2024 11:20   DG Foot Complete Right Result Date: 07/10/2024 CLINICAL DATA:  53 year old male crush injury between building and machine. Pain. EXAM: RIGHT FOOT COMPLETE - 3+ VIEW COMPARISON:  Right foot series 05/16/2009. FINDINGS: Three views including cross-table lateral. Chronic deformities of the distal right 4th and 5th metatarsals were present in 2010. Associated MTP angulation, more so at the 5th joint. Underlying bone mineralization appears normal. No acute fracture or dislocation identified. IMPRESSION: No acute fracture or dislocation identified about the right foot. Chronic deformities of the distal right 4th and 5th metatarsals. Electronically Signed   By: VEAR Hurst  M.D.   On: 07/10/2024 11:19   DG Tibia/Fibula Left Port Result Date: 07/10/2024 CLINICAL DATA:  53 year old male crush injury between building and machine. Pain. EXAM: PORTABLE LEFT TIBIA AND FIBULA - 2 VIEW COMPARISON:  Contralateral right tib fib series today. FINDINGS: Four views 1029 hours. Bone mineralization is within normal limits. Maintained alignment at the left knee and ankle. No knee  joint effusion on cross-table lateral view. Nondisplaced transverse fracture mid left fibula shaft, only readily apparent on image #3. Left tibia appears to remain intact. No other acute osseous abnormality identified. IMPRESSION: Nondisplaced transverse fracture of the mid Left fibula shaft. Electronically Signed   By: VEAR Hurst M.D.   On: 07/10/2024 11:17   DG Tibia/Fibula Right Port Result Date: 07/10/2024 CLINICAL DATA:  53 year old male crush injury between building and machine. Pain. EXAM: PORTABLE RIGHT TIBIA AND FIBULA - 2 VIEW COMPARISON:  None Available. FINDINGS: Four views 1019 hours. Maintained alignment at the right knee and ankle. Comminuted mid right fibula shaft fracture with 1/2 shaft width medial displacement. No significant anterior or posterior displacement. Right tibia appears to remain intact. No other acute osseous abnormality identified. IMPRESSION: Comminuted mid right fibula shaft fracture with 1/2 shaft width medial displacement. Electronically Signed   By: VEAR Hurst M.D.   On: 07/10/2024 11:16    ROS: Review of Systems -  Negative other than this new presentation  Physican Exam: Blood pressure 136/89, pulse 83, temperature 98.2 F (36.8 C), resp. rate 18, height 6' 1 (1.854 m), weight 106.6 kg, SpO2 97%. He is currently awake alert and oriented. His wife is present in the room with him He states that he currently feels as good as he has all day.  His last pain medication dose was at 2:30 PM.  He has not had progressive pain that has needed increasing pain medication currently  He is currently in bilateral lower leg posterior splints as directed. His right leg is soft and nontender particularly as compared to his left Left leg musculature is soft but very tender Intact sensibility distally He moves his toes well without significant reproducible pain  Assessment/Plan Assessment:  1.  Right closed mildly displaced fibula fracture, compression injury 2.  Left closed  non-displaced fibula fracture with left leg muscle pain related to compression  Plan: Today I reviewed with him the nature of and mechanism of injury.  I have admitted him for observation of his lower legs for compartment syndrome.  At this point clinically there does not appear to be concern for this.  I think his left leg pain is coming from compression and contusion of his muscles as opposed to any progressive muscle swelling resulting in compartment syndrome. I reviewed with him that I like to keep him here overnight for observation.  We will remove his splint tomorrow and order cam walker boots. We will order physical therapy for him to be weightbearing as tolerated both lower extremities with the cam boots on. Regular diet with medications prescribed. No DVT prophylaxis   Donnice BIRCH. Ernie, MD  07/10/2024, 5:07 PM

## 2024-07-10 NOTE — Progress Notes (Signed)
   07/10/24 1030  Spiritual Encounters  Type of Visit Attempt (pt unavailable)  Care provided to: Pt not available  Conversation partners present during encounter Nurse  Reason for visit Trauma  OnCall Visit Yes    Chaplains remain available as needed - [phone]

## 2024-07-10 NOTE — ED Notes (Signed)
Pt and all belongings transported upstairs.  

## 2024-07-10 NOTE — ED Triage Notes (Signed)
 Pt was crush between machine and building. C/O left leg injury, obvious deformity, no bone protrusion. EMS gave 4mg  of Zofran  and 100 mcg of fentanyl . Axox4. VSS. GCS 15

## 2024-07-10 NOTE — ED Notes (Signed)
 Ortho tech at bedside to assist with splinting of legs. Wife at bedside.

## 2024-07-10 NOTE — Progress Notes (Signed)
 Paged to Dr. Lauren ans service to notify of pt admission. PA called back and will notify him.

## 2024-07-10 NOTE — ED Notes (Signed)
 CCMD called to place the patient on cardiac monitoring services.

## 2024-07-10 NOTE — ED Notes (Signed)
 Pt verbalizes understanding of pain medication risks and benefits. Pt agrees with fall precuations

## 2024-07-10 NOTE — Plan of Care (Signed)
  Problem: Health Behavior/Discharge Planning: Goal: Ability to manage health-related needs will improve Outcome: Progressing   Problem: Clinical Measurements: Goal: Ability to maintain clinical measurements within normal limits will improve Outcome: Progressing   Problem: Nutrition: Goal: Adequate nutrition will be maintained Outcome: Progressing   

## 2024-07-10 NOTE — Progress Notes (Signed)
 Orthopedic Tech Progress Note Patient Details:  William Frey 26-Jun-1971 [id number]  Ortho Devices Type of Ortho Device: Short leg splint Ortho Device/Splint Location: BLE Ortho Device/Splint Interventions: Ordered, Application, Adjustment   Post Interventions Patient Tolerated: Well Instructions Provided: Care of device  [place] A Wonda 07/10/2024, 1:42 PM

## 2024-07-11 LAB — GLUCOSE, CAPILLARY: Glucose-Capillary: 180 mg/dL — ABNORMAL HIGH (ref 70–99)

## 2024-07-11 MED ORDER — OXYCODONE HCL 5 MG PO TABS: 2.5000 mg | ORAL_TABLET | ORAL | 0 refills | Status: DC | PRN

## 2024-07-11 MED ORDER — SENNA 8.6 MG PO TABS: 1.0000 | ORAL_TABLET | Freq: Two times a day (BID) | ORAL | 0 refills | Status: AC

## 2024-07-11 MED ORDER — ACETAMINOPHEN 500 MG PO TABS: 1000.0000 mg | ORAL_TABLET | Freq: Four times a day (QID) | ORAL | Status: DC

## 2024-07-11 MED ORDER — SENNA 8.6 MG PO TABS: 1.0000 | ORAL_TABLET | Freq: Two times a day (BID) | ORAL | 0 refills | Status: DC

## 2024-07-11 MED ORDER — DOCUSATE SODIUM 100 MG PO CAPS: 100.0000 mg | ORAL_CAPSULE | Freq: Two times a day (BID) | ORAL | 0 refills | Status: DC

## 2024-07-11 MED ORDER — OXYCODONE HCL 5 MG PO TABS: 5.0000 mg | ORAL_TABLET | ORAL | 0 refills | Status: DC | PRN

## 2024-07-11 MED ORDER — DOCUSATE SODIUM 100 MG PO CAPS: 100.0000 mg | ORAL_CAPSULE | Freq: Two times a day (BID) | ORAL | 0 refills | Status: AC

## 2024-07-11 NOTE — Progress Notes (Addendum)
 RW has arrived patient is discharging to main A.

## 2024-07-11 NOTE — Progress Notes (Signed)
 Orthopedic Tech Progress Note Patient Details:  William Frey [birth year] [id number]  Ortho Devices Type of Ortho Device: CAM walker Ortho Device/Splint Location: BLE Ortho Device/Splint Interventions: Ordered, Application, Adjustment   Post Interventions Patient Tolerated: Well Instructions Provided: Care of device  Delanna LITTIE Pac 07/11/2024, 8:51 AM

## 2024-07-11 NOTE — Plan of Care (Signed)
  Problem: Coping: Goal: Level of anxiety will decrease Outcome: Progressing   Problem: Activity: Goal: Risk for activity intolerance will decrease Outcome: Progressing   Problem: Pain Managment: Goal: General experience of comfort will improve and/or be controlled Outcome: Progressing

## 2024-07-11 NOTE — Progress Notes (Signed)
 Walker arrived right before patient was leaving.  Patient discharged with walker.

## 2024-07-11 NOTE — TOC CM/SW Note (Signed)
 Called William Frey with [employer] for walker

## 2024-07-11 NOTE — Progress Notes (Signed)
 Patient ID: William Frey, male   DOB: July 14, 1971, 53 y.o.   MRN: [id number] Subjective:       Patient reports pain as moderate.Still having more pain in left leg than his right. No significant change or increase in his pain level  Objective:   VITALS:   Vitals:   07/11/24 0525 07/11/24 0753  BP: (!) 144/98 138/85  Pulse: 71 77  Resp: 18 18  Temp: 97.7 F (36.5 C) 97.6 F (36.4 C)  SpO2: 98% 96%    Neurovascular intact B lower extremity splints intact Bilateral lower legs muscle compartment soft with tenderness left greater than right  LABS Recent Labs    07/10/24 1010 07/10/24 1017  HGB 14.3 14.6  HCT 42.8 43.0  WBC 7.8  --   PLT 204  --     Recent Labs    07/10/24 1010 07/10/24 1017  NA 138 138  K 3.9 3.8  BUN 17 20  CREATININE 1.32* 1.30*  GLUCOSE 228* 227*    Recent Labs    07/10/24 1010  INR 1.0     Assessment/Plan:    Bilateral fibular fracture with associated compression fracture  Up with therapy Fit with bilateral Cam Walkers WBAT work with PT to determine functional capabilities RTC in 2 weeks Reviewed expectations with regards to recovery time and work  Recommend elevation to help with swelling and ice for pain He should limit use of NSAIDs to diagnosis of renal impairment related to his diabetes

## 2024-07-11 NOTE — TOC CAGE-AID Note (Signed)
 Transition of Care [facility]) - CAGE-AID Screening   Patient Details  Name: William Frey MRN: [id number] Date of Birth: 10-26-1971  Transition of Care [facility] CM/SW Contact:    Adrina Armijo E Natika Geyer, LCSW Phone Number: 07/11/2024, 11:24 AM   Clinical Narrative:    CAGE-AID Screening:    Have You Ever Felt You Ought to Cut Down on Your Drinking or Drug Use?: No Have People Annoyed You By [employer] Your Drinking Or Drug Use?: No Have You Felt Bad Or Guilty About Your Drinking Or Drug Use?: No Have You Ever Had a Drink or Used Drugs First Thing In The Morning to Steady Your Nerves or to Get Rid of a Hangover?: No CAGE-AID Score: 0  Substance Abuse Education Offered: No

## 2024-07-11 NOTE — Evaluation (Signed)
 Physical Therapy Evaluation and DIscharge Patient Details Name: William Frey MRN: [id number] DOB: September 24, 1971 Today's Date: 07/11/2024  History of Present Illness  Pt is 53 yo male who presents on 07/10/24 with BLE crush injuries from a towing machine. Imaging revealed R closed minimally displaced fibula fx and compression injury and L closed non-displaced fibula fx with compression related muscle pain. PMH: hypertension, hyperlipidemia, anxiety, depression, diabetes, asthma, GERD, and kidney stones  Clinical Impression  Patient evaluated by Physical Therapy with no further acute PT needs identified. All education has been completed and the patient has no further questions. Pt from home with wife and has additional friend/ family support if needed. Pt tolerating ambulation with RW, recommend this for home. Pt instructed to mobilize frequently, each hour but for limited time up, 5-10 mins, and to use borrowed w/c for community distances. Educated on there ex, elevation positioning, and how to get up ramp. Pt ready for d/c home once he receives tall RW.  PT is signing off. Thank you for this referral.         If plan is discharge home, recommend the following: Help with stairs or ramp for entrance;Assistance with cooking/housework;Assist for transportation   Can travel by private vehicle        Equipment Recommendations Rolling walker (2 wheels) (tall, pt at least 6'1)  Recommendations for Other Services       Functional Status Assessment Patient has had a recent decline in their functional status and demonstrates the ability to make significant improvements in function in a reasonable and predictable amount of time.     Precautions / Restrictions Precautions Precautions: None Required Braces or Orthoses: Other Brace Other Brace: bilateral CAM boots Restrictions Weight Bearing Restrictions Per Provider Order: Yes RLE Weight Bearing Per Provider Order: Weight bearing as tolerated LLE  Weight Bearing Per Provider Order: Weight bearing as tolerated      Mobility  Bed Mobility Overal bed mobility: Modified Independent             General bed mobility comments: pt able to come to EOB and return to supine without assist    Transfers Overall transfer level: Modified independent Equipment used: Rolling walker (2 wheels)               General transfer comment: vc's for hand placement. Pt able to stand from recliner and bed    Ambulation/Gait Ambulation/Gait assistance: Supervision Gait Distance (Feet): 25 Feet Assistive device: Rolling walker (2 wheels) Gait Pattern/deviations: Step-through pattern Gait velocity: decreased Gait velocity interpretation: <1.31 ft/sec, indicative of household ambulator   General Gait Details: with both CAM boots on, pt has difficulty not hyperextending both knees but with R patellar pain, educated him on taking small steps and keeping knees soft. Pt mobile with use of RW but instructed to limit distances to household and use w/c (which they are borrowing from a friend) for community distances  Stairs Stairs:  (instructed to step up ramp bkwds with RW. Wife educated on this as well)          Wheelchair Mobility     Tilt Bed    Modified Rankin (Stroke Patients Only)       Balance Overall balance assessment: No apparent balance deficits (not formally assessed)  Pertinent Vitals/Pain Pain Assessment Pain Assessment: Faces Faces Pain Scale: Hurts whole lot Pain Location: R patella in standing, L calf in standing and at rest Pain Descriptors / Indicators: Tightness Pain Intervention(s): Limited activity within patient's tolerance, Monitored during session    Home Living Family/patient expects to be discharged to:: Private residence Living Arrangements: Spouse/significant other Available Help at Discharge: Family;Available PRN/intermittently Type of  Home: House Home Access: Stairs to enter;Ramped entrance Entrance Stairs-Rails: None Entrance Stairs-Number of Steps: 3   Home Layout: One level Home Equipment: Shower seat (borrowing a w/c) Additional Comments: wife will be at work during the day but they have additional support that can help with meals, etc    Prior Function Prior Level of Function : Independent/Modified Independent;Working/employed;Driving                     Extremity/Trunk Assessment   Upper Extremity Assessment Upper Extremity Assessment: Overall WFL for tasks assessed    Lower Extremity Assessment Lower Extremity Assessment: LLE deficits/detail;RLE deficits/detail RLE Deficits / Details: less painful than LLE. Ankle WFL, able to bend R knee to >90 without increased in pain. Reports pain at patella when ambulating. Hip WFL RLE: Unable to fully assess due to pain RLE Sensation: history of peripheral neuropathy RLE Coordination: decreased gross motor LLE Deficits / Details: tolerating minimal motion of L ankle due to calf pain. Tolerated knee flexion >90 without significant increase in pain. Hip WFL. Pt educated on wathcing for increased numbness LLE, cold foot, increased tigtness in calf LLE: Unable to fully assess due to pain LLE Sensation: history of peripheral neuropathy LLE Coordination: decreased gross motor    Cervical / Trunk Assessment Cervical / Trunk Assessment: Normal  Communication   Communication Communication: No apparent difficulties    Cognition Arousal: Alert Behavior During Therapy: Anxious   PT - Cognitive impairments: No apparent impairments                       PT - Cognition Comments: pt very motivated, could have a tendency to overdo it. DIscussed this with his wife, esp as his pain decreases Following commands: Intact       Cueing Cueing Techniques: Verbal cues     General Comments General comments (skin integrity, edema, etc.): Recommended frequent  activity, every hour, but limiting time up to 5-10 mins. Elevate LEs when down. ROM BLE's with CAM boots off when in bed    Exercises General Exercises - Lower Extremity Ankle Circles/Pumps: AROM, Both, 10 reps, Supine Quad Sets: AROM, Both, 10 reps, Supine Long Arc Quad: AROM, Both, 5 reps, Seated Heel Slides: AROM, Both, 5 reps, Supine   Assessment/Plan    PT Assessment Patient does not need any further PT services  PT Problem List         PT Treatment Interventions      PT Goals (Current goals can be found in the Care Plan section)  Acute Rehab PT Goals Patient Stated Goal: return to home and work PT Goal Formulation: All assessment and education complete, DC therapy    Frequency       Co-evaluation               AM-PAC PT 6 Clicks Mobility  Outcome Measure Help needed turning from your back to your side while in a flat bed without using bedrails?: None Help needed moving from lying on your back to sitting on the side of a flat bed without using bedrails?:  None Help needed moving to and from a bed to a chair (including a wheelchair)?: None Help needed standing up from a chair using your arms (e.g., wheelchair or bedside chair)?: None Help needed to walk in hospital room?: A Little Help needed climbing 3-5 steps with a railing? : A Little 6 Click Score: 22    End of Session   Activity Tolerance: Patient tolerated treatment well Patient left: in bed;with call bell/phone within reach;with family/visitor present Nurse Communication: Mobility status PT Visit Diagnosis: Unsteadiness on feet (R26.81);Pain Pain - Right/Left:  (bilateral) Pain - part of body: Leg    Time: 9149-9065 PT Time Calculation (min) (ACUTE ONLY): 44 min   Charges:   PT Evaluation $PT Eval Low Complexity: 1 Low PT Treatments $Gait Training: 8-22 mins $Therapeutic Exercise: 8-22 mins PT General Charges $$ ACUTE PT VISIT: 1 Visit         Richerd Lipoma, PT  Acute Rehab  Services Secure chat preferred Office [phone]   Richerd CROME Lourie Retz 07/11/2024, 10:55 AM

## 2024-07-11 NOTE — Progress Notes (Signed)
 CAM boots applied by ortho tech

## 2024-07-18 NOTE — Discharge Summary (Signed)
 Patient ID: William Frey MRN: [id number] DOB/AGE: 07/16/1971 53 y.o.  Admit date: 07/10/2024 Discharge date: 07/11/2024  Admission Diagnoses:  1.  Right closed mildly displaced fibula fracture, compression injury 2.  Left closed non-displaced fibula fracture with left leg muscle pain related to compression  Discharge Diagnoses:  Principal Problem:   Bilateral fibular fractures Active Problems:   History of fibula fracture   Past Medical History:  Diagnosis Date   Allergy     Anxiety    Arthritis    Asthma    Chicken pox    Complication of anesthesia    difficult to wake up   COVID-19    11/26/20 sp MAB infusion   Detachment of glenoid labrum 07/05/2020   Diabetes mellitus without complication (HCC)    Family history of adverse reaction to anesthesia    father had low tolerance for anesthesia   GERD (gastroesophageal reflux disease)    History of COVID-19 02/10/2021   History of kidney stones    Hyperlipidemia    Hypertension    Low testosterone     Lumbar sprain 07/05/2020   Migraines    Neck sprain 07/05/2020   Nephrolithiasis 11/12/2018   Pain in both feet 06/01/2022   Panic attack 12/27/2020   Scalp lesion 11/12/2022    Surgeries:  on * No surgery found *   Consultants:   Discharged Condition: Improved  Hospital Course: William Frey is an 53 y.o. male who was admitted 07/10/2024 for operative treatment ofBilateral fibular fractures.   Patient was given perioperative antibiotics:  Anti-infectives (From admission, onward)    Start     Dose/Rate Route Frequency Ordered Stop   07/10/24 1015  clindamycin  (CLEOCIN ) IVPB 900 mg        900 mg 100 mL/hr over 30 Minutes Intravenous  Once 07/10/24 1010 07/10/24 1151        Patient was admitted to the floor for pain control, mobilization, and monitoring after crush injury. He had no signs of compartment syndrome. He was fitted with CAM boots and able to WBAT with a walker safely.   Patient benefited  maximally from hospital stay and there were no complications.    Recent vital signs: No data found.   Recent laboratory studies: No results for input(s): WBC, HGB, HCT, PLT, NA, K, CL, CO2, BUN, CREATININE, GLUCOSE, INR, CALCIUM  in the last 72 hours.  Invalid input(s): PT, 2   Discharge Medications:   Allergies as of 07/11/2024       Reactions   Penicillins Hives   Childhood allergy  Has patient had a PCN reaction causing immediate rash, facial/tongue/throat swelling, SOB or lightheadedness with hypotension: No Has patient had a PCN reaction causing severe rash involving mucus membranes or skin necrosis: No Has patient had a PCN reaction that required hospitalization: No Has patient had a PCN reaction occurring within the last 10 years: No If all of the above answers are NO, then may proceed with Cephalosporin use.        Medication List     TAKE these medications    acetaminophen  500 MG tablet Commonly known as: TYLENOL  Take 2 tablets (1,000 mg total) by mouth every 6 (six) hours.   albuterol  108 (90 Base) MCG/ACT inhaler Commonly known as: VENTOLIN  HFA Inhale 1-2 puffs into the lungs every 6 (six) hours as needed.   atorvastatin  10 MG tablet Commonly known as: LIPITOR Take 1 tablet (10 mg total) by mouth daily. for cholesterol.   Blood Glucose Monitoring Suppl Devi 1  each by Does not apply route in the morning, at noon, and at bedtime. May substitute to any manufacturer covered by patient's insurance.   BLOOD GLUCOSE TEST STRIPS Strp 1 each by In Vitro route in the morning, at noon, and at bedtime. May substitute to any manufacturer covered by patient's insurance.   cyclobenzaprine  10 MG tablet Commonly known as: FLEXERIL  Take 1 tablet (10 mg total) by mouth 3 (three) times daily as needed for muscle spasms.   docusate sodium  100 MG capsule Commonly known as: COLACE Take 1 capsule (100 mg total) by mouth 2 (two) times daily.    Lancet Device Misc 1 each by Does not apply route in the morning, at noon, and at bedtime. May substitute to any manufacturer covered by patient's insurance.   Lancets Misc. Misc 1 each by Does not apply route in the morning, at noon, and at bedtime. May substitute to any manufacturer covered by patient's insurance.   onetouch ultrasoft lancets Use as instructed   oxyCODONE  5 MG immediate release tablet Commonly known as: Oxy IR/ROXICODONE  Take 1 tablet (5 mg total) by mouth every 4 (four) hours as needed for moderate pain (pain score 4-6) or severe pain (pain score 7-10).   Semaglutide  (2 MG/DOSE) 8 MG/3ML Sopn Inject 2 mg as directed once a week. for diabetes.   senna 8.6 MG Tabs tablet Commonly known as: SENOKOT Take 1 tablet (8.6 mg total) by mouth 2 (two) times daily for 14 days.   Testosterone  1.62 % Gel Apply 4 Pump topically daily. PLACE 4 PUMPS ONTO THE SKIN DAILY        Diagnostic Studies: DG Foot Complete Left Result Date: 07/10/2024 CLINICAL DATA:  53 year old male crush injury between building and machine. Pain. EXAM: LEFT FOOT - COMPLETE 3+ VIEW COMPARISON:  Left ankle series 04/26/2005. FINDINGS: Three views. Bone mineralization is within normal limits. 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 Calcaneus stable and intact. No acute osseous abnormality identified. No discrete soft tissue injury. IMPRESSION: No acute fracture or dislocation identified about the left foot. Electronically Signed   By: VEAR Hurst M.D.   On: 07/10/2024 11:20   DG Foot Complete Right Result Date: 07/10/2024 CLINICAL DATA:  53 year old male crush injury between building and machine. Pain. EXAM: RIGHT FOOT COMPLETE - 3+ VIEW COMPARISON:  Right foot series 05/16/2009. FINDINGS: Three views including cross-table lateral. Chronic deformities of the distal right 4th and 5th metatarsals were present in 2010. Associated MTP angulation, more so at the 5th joint. Underlying bone mineralization appears  normal. No acute fracture or dislocation identified. IMPRESSION: No acute fracture or dislocation identified about the right foot. Chronic deformities of the distal right 4th and 5th metatarsals. Electronically Signed   By: VEAR Hurst M.D.   On: 07/10/2024 11:19   DG Tibia/Fibula Left Port Result Date: 07/10/2024 CLINICAL DATA:  53 year old male crush injury between building and machine. Pain. EXAM: PORTABLE LEFT TIBIA AND FIBULA - 2 VIEW COMPARISON:  Contralateral right tib fib series today. FINDINGS: Four views 1029 hours. Bone mineralization is within normal limits. Maintained alignment at the left knee and ankle. No knee joint effusion on cross-table lateral view. Nondisplaced transverse fracture mid left fibula shaft, only readily apparent on image #3. Left tibia appears to remain intact. No other acute osseous abnormality identified. IMPRESSION: Nondisplaced transverse fracture of the mid Left fibula shaft. Electronically Signed   By: VEAR Hurst M.D.   On: 07/10/2024 11:17   DG Tibia/Fibula Right Port Result Date: 07/10/2024 CLINICAL DATA:  53 year old male crush injury between building and machine. Pain. EXAM: PORTABLE RIGHT TIBIA AND FIBULA - 2 VIEW COMPARISON:  None Available. FINDINGS: Four views 1019 hours. Maintained alignment at the right knee and ankle. Comminuted mid right fibula shaft fracture with 1/2 shaft width medial displacement. No significant anterior or posterior displacement. Right tibia appears to remain intact. No other acute osseous abnormality identified. IMPRESSION: Comminuted mid right fibula shaft fracture with 1/2 shaft width medial displacement. Electronically Signed   By: VEAR Hurst M.D.   On: 07/10/2024 11:16    Disposition: Discharge disposition: 01-Home or Self Care       Discharge Instructions     Call MD / Call 911   Complete by: As directed    If you experience chest pain or shortness of breath, CALL 911 and be transported to the hospital emergency room.  If you  develope a fever above 101 F, pus (white drainage) or increased drainage or redness at the wound, or calf pain, call your surgeon's office.   Constipation Prevention   Complete by: As directed    Drink plenty of fluids.  Prune juice may be helpful.  You may use a stool softener, such as Colace (over the counter) 100 mg twice a day.  Use MiraLax (over the counter) for constipation as needed.   Diet - low sodium heart healthy   Complete by: As directed    Increase activity slowly as tolerated   Complete by: As directed    Weight bearing as tolerated with assist device (walker, cane, etc) as directed, use it as long as suggested by your surgeon or therapist, typically at least 4-6 weeks.   Post-operative opioid taper instructions:   Complete by: As directed    POST-OPERATIVE OPIOID TAPER INSTRUCTIONS: It is important to wean off of your opioid medication as soon as possible. If you do not need pain medication after your surgery it is ok to stop day one. Opioids include: Codeine , Hydrocodone (Norco, Vicodin), Oxycodone (Percocet, oxycontin ) and hydromorphone  amongst others.  Long term and even short term use of opiods can cause: Increased pain response Dependence Constipation Depression Respiratory depression And more.  Withdrawal symptoms can include Flu like symptoms Nausea, vomiting And more Techniques to manage these symptoms Hydrate well Eat regular healthy meals Stay active Use relaxation techniques(deep breathing, meditating, yoga) Do Not substitute Alcohol to help with tapering If you have been on opioids for less than two weeks and do not have pain than it is ok to stop all together.  Plan to wean off of opioids This plan should start within one week post op of your joint replacement. Maintain the same interval or time between taking each dose and first decrease the dose.  Cut the total daily intake of opioids by one tablet each day Next start to increase the time between  doses. The last dose that should be eliminated is the evening dose.           Follow-up Information     Ernie Cough, MD. Schedule an appointment as soon as possible for a visit in 2 week(s).   Specialty: Orthopedic Surgery Contact information: [address] [place] [address] [phone]                  Signed: Rosina JONELLE Calin 07/18/2024, 9:02 AM

## 2024-07-19 DIAGNOSIS — S82492A Other fracture of shaft of left fibula, initial encounter for closed fracture: Secondary | ICD-10-CM | POA: Diagnosis not present

## 2024-07-19 DIAGNOSIS — S82491A Other fracture of shaft of right fibula, initial encounter for closed fracture: Secondary | ICD-10-CM | POA: Diagnosis not present

## 2024-07-25 ENCOUNTER — Telehealth: Payer: Self-pay

## 2024-07-25 DIAGNOSIS — M25551 Pain in right hip: Secondary | ICD-10-CM

## 2024-07-25 MED ORDER — CYCLOBENZAPRINE HCL 10 MG PO TABS: 10.0000 mg | ORAL_TABLET | Freq: Three times a day (TID) | ORAL | 1 refills | Status: AC | PRN

## 2024-07-25 NOTE — Telephone Encounter (Signed)
 Pt called and notified of recall on cyclobenzaprine . Advised not to take any of this medication. New prescription will be sent in to pt's preferred pharmacy. Pt verbalized understanding. See scanned document regarding recall.

## 2024-08-11 DIAGNOSIS — S82401D Unspecified fracture of shaft of right fibula, subsequent encounter for closed fracture with routine healing: Secondary | ICD-10-CM | POA: Diagnosis not present

## 2024-08-11 DIAGNOSIS — S82402D Unspecified fracture of shaft of left fibula, subsequent encounter for closed fracture with routine healing: Secondary | ICD-10-CM | POA: Diagnosis not present

## 2024-09-18 ENCOUNTER — Other Ambulatory Visit: Payer: Self-pay | Admitting: Physician Assistant

## 2024-09-18 DIAGNOSIS — E349 Endocrine disorder, unspecified: Secondary | ICD-10-CM

## 2024-09-22 ENCOUNTER — Ambulatory Visit: Admitting: Primary Care

## 2024-09-22 DIAGNOSIS — S82401A Unspecified fracture of shaft of right fibula, initial encounter for closed fracture: Secondary | ICD-10-CM | POA: Diagnosis not present

## 2024-09-22 DIAGNOSIS — S82402A Unspecified fracture of shaft of left fibula, initial encounter for closed fracture: Secondary | ICD-10-CM | POA: Diagnosis not present

## 2024-09-23 ENCOUNTER — Other Ambulatory Visit: Payer: Self-pay | Admitting: Primary Care

## 2024-09-23 DIAGNOSIS — E1165 Type 2 diabetes mellitus with hyperglycemia: Secondary | ICD-10-CM

## 2024-09-25 NOTE — Telephone Encounter (Signed)
 Spoke with patient and informed him prior to sending refills he would need to be seen in clinic. Offered a follow up appointment for 11/20 at 3 pm. Patient voiced understanding and accepted appointment.

## 2024-09-28 ENCOUNTER — Ambulatory Visit: Admitting: Physician Assistant

## 2024-09-28 VITALS — BP 124/74 | HR 78 | Ht 74.0 in | Wt 235.0 lb

## 2024-09-28 DIAGNOSIS — E291 Testicular hypofunction: Secondary | ICD-10-CM | POA: Diagnosis not present

## 2024-09-28 DIAGNOSIS — Z87442 Personal history of urinary calculi: Secondary | ICD-10-CM | POA: Diagnosis not present

## 2024-09-28 MED ORDER — TESTOSTERONE 1.62 % TD GEL: 4.0000 | Freq: Every day | TRANSDERMAL | 1 refills | Status: AC

## 2024-09-28 NOTE — Patient Instructions (Addendum)
 After the new year, please call your insurance company and ask what types of testosterone  are on your formulary. Options we might consider include: -Subcutaneous testosterone  autoinjections [place]) -Oral testosterone  (Tlando or Jatenzo)  We discussed that you would not want to do pellets (Testopel ) or intramuscular injections (testosterone  cypionate).

## 2024-09-28 NOTE — Progress Notes (Signed)
 09/28/2024 5:04 PM   William Frey Jan 03, 1971 [id number]  CC: Chief Complaint  Patient presents with   Follow-up   HPI: William Frey is a 53 y.o. male with PMH hypogonadism on testosterone  gel, uncontrolled diabetes on Ozempic , ED, and nephrolithiasis who presents today for routine follow-up.   Today he reports he remains on 4 pumps of testosterone  gel daily, 2 per shoulder.  His most recent testosterone  back in February remained subtherapeutic despite testosterone  gel at 122, however he states today that he can still tell a difference when he is taking it.  He wishes to continue it, but is curious about alternative formulations.  He is not interested in Testopel , as he had a brother who had a poor reaction to it in the past.  He also has a significant needle phobia and will not be able to do IM injections.  He thinks he may be able to do subcutaneous injections with Jenet, since he was ultimately able to convince himself to do the Ozempic  autoinjector.  He is asymptomatic of stones with no flank pain or gross hematuria.  His recent medical history is notable for bilateral fibular fractures.  He notes today that his needle phobia is so severe that he had deferred a tetanus booster for 5 years until the accident that caused his fractures.  PMH: Past Medical History:  Diagnosis Date   Allergy     Anxiety    Arthritis    Asthma    Chicken pox    Complication of anesthesia    difficult to wake up   COVID-19    11/26/20 sp MAB infusion   Detachment of glenoid labrum 07/05/2020   Diabetes mellitus without complication [facility])    Family history of adverse reaction to anesthesia    father had low tolerance for anesthesia   GERD (gastroesophageal reflux disease)    History of COVID-19 02/10/2021   History of kidney stones    Hyperlipidemia    Hypertension    Low testosterone     Lumbar sprain 07/05/2020   Migraines    Neck sprain 07/05/2020   Nephrolithiasis 11/12/2018    Pain in both feet 06/01/2022   Panic attack 12/27/2020   Scalp lesion 11/12/2022    Surgical History: Past Surgical History:  Procedure Laterality Date   CYSTOSCOPY W/ RETROGRADES Right 10/28/2018   Procedure: CYSTOSCOPY WITH RETROGRADE PYELOGRAM;  Surgeon: Francisca Redell BROCKS, MD;  Location: [facility] ORS;  Service: Urology;  Laterality: Right;   CYSTOSCOPY/URETEROSCOPY/HOLMIUM LASER/STENT PLACEMENT Right 10/28/2018   Procedure: CYSTOSCOPY/URETEROSCOPY/HOLMIUM LASER/STENT PLACEMENT;  Surgeon: Francisca Redell BROCKS, MD;  Location: [facility] ORS;  Service: Urology;  Laterality: Right;   OSTECTOMY METATARSAL Right 2003    Home Medications:  Allergies as of 09/28/2024       Reactions   Penicillins Hives   Childhood allergy  Has patient had a PCN reaction causing immediate rash, facial/tongue/throat swelling, SOB or lightheadedness with hypotension: No Has patient had a PCN reaction causing severe rash involving mucus membranes or skin necrosis: No Has patient had a PCN reaction that required hospitalization: No Has patient had a PCN reaction occurring within the last 10 years: No If all of the above answers are NO, then may proceed with Cephalosporin use.        Medication List        Accurate as of September 28, 2024  5:04 PM. If you have any questions, ask your nurse or doctor.          STOP taking  these medications    atorvastatin  10 MG tablet Commonly known as: LIPITOR       TAKE these medications    acetaminophen  500 MG tablet Commonly known as: TYLENOL  Take 2 tablets (1,000 mg total) by mouth every 6 (six) hours.   albuterol  108 (90 Base) MCG/ACT inhaler Commonly known as: VENTOLIN  HFA Inhale 1-2 puffs into the lungs every 6 (six) hours as needed.   Blood Glucose Monitoring Suppl Devi 1 each by Does not apply route in the morning, at noon, and at bedtime. May substitute to any manufacturer covered by patient's insurance.   BLOOD GLUCOSE TEST STRIPS Strp 1 each by In  Vitro route in the morning, at noon, and at bedtime. May substitute to any manufacturer covered by patient's insurance.   cyclobenzaprine  10 MG tablet Commonly known as: FLEXERIL  Take 1 tablet (10 mg total) by mouth 3 (three) times daily as needed for muscle spasms.   docusate sodium  100 MG capsule Commonly known as: COLACE Take 1 capsule (100 mg total) by mouth 2 (two) times daily.   Lancet Device Misc 1 each by Does not apply route in the morning, at noon, and at bedtime. May substitute to any manufacturer covered by patient's insurance.   Lancets Misc. Misc 1 each by Does not apply route in the morning, at noon, and at bedtime. May substitute to any manufacturer covered by patient's insurance.   onetouch ultrasoft lancets Use as instructed   oxyCODONE  5 MG immediate release tablet Commonly known as: Oxy IR/ROXICODONE  Take 1 tablet (5 mg total) by mouth every 4 (four) hours as needed for moderate pain (pain score 4-6) or severe pain (pain score 7-10).   Ozempic  (2 MG/DOSE) 8 MG/3ML Sopn Generic drug: Semaglutide  (2 MG/DOSE) INJECT 2MG  AS DIRECTED ONCE A WEEK FOR DIABETES.   Testosterone  1.62 % Gel Apply 4 Pump topically daily. What changed: additional instructions        Allergies:  Allergies  Allergen Reactions   Penicillins Hives    Childhood allergy  Has patient had a PCN reaction causing immediate rash, facial/tongue/throat swelling, SOB or lightheadedness with hypotension: No Has patient had a PCN reaction causing severe rash involving mucus membranes or skin necrosis: No Has patient had a PCN reaction that required hospitalization: No Has patient had a PCN reaction occurring within the last 10 years: No If all of the above answers are NO, then may proceed with Cephalosporin use.     Family History: Family History  Problem Relation Age of Onset   Arthritis Mother    Breast cancer Mother        breast and bone   Heart Problems Mother    Arthritis Father     Lymphoma Father    Heart disease Father    Stroke Father    Hypertension Father    Clotting disorder Father    Lung cancer Brother    Heart disease Brother        atriall fibrillation   Diabetes Brother    Heart disease Brother        atrial fibrillation   Diabetes Maternal Aunt    Diabetes Maternal Aunt     Social History:   reports that he quit smoking about 11 years ago. His smoking use included cigarettes. He has been exposed to tobacco smoke. He has never used smokeless tobacco. He reports that he does not drink alcohol and does not use drugs.  Physical Exam: BP 124/74 (BP Location: Left Arm, Patient Position: Sitting, Cuff  Size: Large)   Pulse 78   Ht 6' 2 (1.88 m)   Wt 235 lb (106.6 kg)   SpO2 98%   BMI 30.17 kg/m   Constitutional:  Alert and oriented, no acute distress, nontoxic appearing HEENT: , AT Cardiovascular: No clubbing, cyanosis, or edema Respiratory: Normal respiratory effort, no increased work of breathing Skin: No rashes, bruises or suspicious lesions Neurologic: Grossly intact, no focal deficits, moving all 4 extremities Psychiatric: Normal mood and affect  Assessment & Plan:   1. Testosterone  deficiency (Primary) Will continue topical testosterone  gel for now, though he understands his levels are subtherapeutic.  We did discuss cycling placement location to maximize absorption.  Severe needle phobia, IM formulations are not a viable alternative.  I asked him to reach out to his insurance company after the new year to check his formulary with regard to subcutaneous or oral formulations of testosterone .  He will send me a MyChart message when he knows more. - Testosterone  1.62 % GEL; Apply 4 Pump topically daily.  Dispense: 300 g; Refill: 1  2. History of nephrolithiasis Asymptomatic, will continue to monitor.   Return for Send me a message after the new year with insurance coverage info for your testosterone .  Lucie Hones,  PA-C  [facility] [place] [address] [place], [place] [address] [phone]

## 2024-10-16 ENCOUNTER — Ambulatory Visit: Admitting: Dermatology

## 2024-10-17 DIAGNOSIS — E1165 Type 2 diabetes mellitus with hyperglycemia: Secondary | ICD-10-CM

## 2024-10-17 MED ORDER — OZEMPIC (2 MG/DOSE) 8 MG/3ML ~~LOC~~ SOPN: 2.0000 mg | PEN_INJECTOR | SUBCUTANEOUS | 0 refills | Status: AC

## 2024-10-24 ENCOUNTER — Ambulatory Visit: Admitting: Primary Care

## 2024-10-24 ENCOUNTER — Encounter: Payer: Self-pay | Admitting: Primary Care

## 2024-10-24 VITALS — BP 122/82 | HR 80 | Temp 97.8°F | Ht 72.84 in | Wt 237.4 lb

## 2024-10-24 DIAGNOSIS — F419 Anxiety disorder, unspecified: Secondary | ICD-10-CM | POA: Diagnosis not present

## 2024-10-24 DIAGNOSIS — F32A Depression, unspecified: Secondary | ICD-10-CM | POA: Diagnosis not present

## 2024-10-24 DIAGNOSIS — Z7985 Long-term (current) use of injectable non-insulin antidiabetic drugs: Secondary | ICD-10-CM

## 2024-10-24 DIAGNOSIS — E1165 Type 2 diabetes mellitus with hyperglycemia: Secondary | ICD-10-CM

## 2024-10-24 LAB — BASIC METABOLIC PANEL WITH GFR
BUN: 16 mg/dL (ref 6–23)
CO2: 25 meq/L (ref 19–32)
Calcium: 9.1 mg/dL (ref 8.4–10.5)
Chloride: 104 meq/L (ref 96–112)
Creatinine, Ser: 1.08 mg/dL (ref 0.40–1.50)
GFR: 78.97 mL/min (ref >60.00)
Glucose, Bld: 131 mg/dL — ABNORMAL HIGH (ref 70–99)
Potassium: 4.1 meq/L (ref 3.5–5.1)
Sodium: 137 meq/L (ref 135–145)

## 2024-10-24 LAB — POCT GLYCOSYLATED HEMOGLOBIN (HGB A1C): Hemoglobin A1C: 7 % — AB (ref 4.0–5.6)

## 2024-10-24 MED ORDER — HYDROXYZINE HCL 10 MG PO TABS: 10.0000 mg | ORAL_TABLET | Freq: Every day | ORAL | 0 refills | Status: AC | PRN

## 2024-10-24 NOTE — Assessment & Plan Note (Signed)
 Chronic anxiety.   We discussed options for treatment.  Will start hydroxyzine  10 mg PRN.   He will update.

## 2024-10-24 NOTE — Patient Instructions (Addendum)
 Continue Ozempic  2 mg weekly for diabetes.  Stop by the lab prior to leaving today. I will notify you of your results once received.   You may take hydroxyzine  as needed for panic attacks. You may have 1 or 2 tablets.   Please schedule a follow up visit for 6 months for a diabetes check.  It was a pleasure to see you today!

## 2024-10-24 NOTE — Progress Notes (Signed)
 Subjective:    Patient ID: William Frey, male    DOB: 31-Dec-1970, 53 y.o.   MRN: [id number]  William Frey is a very pleasant 53 y.o. male with a history of hypertension, type 2 diabetes , hyperlipidemia, proteinuria who presents today for follow-up of diabetes. He would also like to discuss anxiety.  1) Type 2 Diabetes:  Current medications include: Ozempic  2 mg weekly. Overall he's tolerating the Ozempic  well overall. The Ozempic  will curb his appetite.   He is checking his blood glucose 0 times daily.   Last A1C: 8.1 in August 2025, 7.0 today. Last Eye Exam: Up-to-date Last Foot Exam: Due Pneumonia Vaccination: Never completed Urine Microalbumin: Up-to-date Statin: None, he declines statin therapy  Dietary changes since last visit: He eats some meat. He continues to eat take out food, fast food mostly. He is drinking sweet tea (half/half), some water.    Exercise: None, active with work.   BP Readings from Last 3 Encounters:  10/24/24 122/82  09/28/24 124/74  07/11/24 138/85   Wt Readings from Last 3 Encounters:  10/24/24 237 lb 6 oz (107.7 kg)  09/28/24 235 lb (106.6 kg)  07/10/24 235 lb (106.6 kg)   2) Chronic Anxiety: Chronic for years, has presented to the ED for chest pain/anxiety symptoms thinking he was having a heart attack. He has intermittent symptoms of chest pressure, feeling anxious, palpitations, occur 7 days weekly sometimes once every few months. These symptoms will wake him from sleep. He has undergone an extensive cardiac work up which grossly negative.   He has a very stressful job.    Review of Systems  Respiratory:  Negative for shortness of breath.   Cardiovascular:  Negative for chest pain.  Gastrointestinal:  Negative for abdominal pain and nausea.  Psychiatric/Behavioral:  The patient is nervous/anxious.          Past Medical History:  Diagnosis Date   Allergy     Anxiety    Arthritis    Asthma    Chicken pox     Complication of anesthesia    difficult to wake up   COVID-19    11/26/20 sp MAB infusion   Detachment of glenoid labrum 07/05/2020   Diabetes mellitus without complication (HCC)    Family history of adverse reaction to anesthesia    father had low tolerance for anesthesia   GERD (gastroesophageal reflux disease)    History of COVID-19 02/10/2021   History of kidney stones    Hyperlipidemia    Hypertension    Low testosterone     Lumbar sprain 07/05/2020   Migraines    Neck sprain 07/05/2020   Nephrolithiasis 11/12/2018   Pain in both feet 06/01/2022   Panic attack 12/27/2020   Scalp lesion 11/12/2022    Social History   Socioeconomic History   Marital status: Married    Spouse name: Not on file   Number of children: Not on file   Years of education: Not on file   Highest education level: Not on file  Occupational History   Not on file  Tobacco Use   Smoking status: Former    Current packs/day: 0.00    Types: Cigarettes    Quit date: 09/17/2013    Years since quitting: 11.1    Passive exposure: Past   Smokeless tobacco: Never   Tobacco comments:    Patient smoked on and off from 1989 to 2015. Patient has smoked a total of 14 years and averaged 1.5 packs per  day = 21 pack year history.  Vaping Use   Vaping status: Never Used  Substance and Sexual Activity   Alcohol use: No    Alcohol/week: 0.0 standard drinks of alcohol    Comment: former   Drug use: No   Sexual activity: Yes  Other Topics Concern   Not on file  Social History Narrative   Married          Social Drivers of Health   Tobacco Use: Medium Risk (10/24/2024)   Patient History    Smoking Tobacco Use: Former    Smokeless Tobacco Use: Never    Passive Exposure: Past  Physicist, Medical Strain: Not on file  Food Insecurity: No Food Insecurity (07/11/2024)   Epic    Worried About Programme Researcher, Broadcasting/film/video in the Last Year: Never true    Ran Out of Food in the Last Year: Never true  Transportation Needs:  No Transportation Needs (07/11/2024)   Epic    Lack of Transportation (Medical): No    Lack of Transportation (Non-Medical): No  Physical Activity: Not on file  Stress: Not on file  Social Connections: Not on file  Intimate Partner Violence: Not At Risk (07/11/2024)   Epic    Fear of Current or Ex-Partner: No    Emotionally Abused: No    Physically Abused: No    Sexually Abused: No  Depression (PHQ2-9): Low Risk (10/24/2024)   Depression (PHQ2-9)    PHQ-2 Score: 0  Alcohol Screen: Not on file  Housing: Low Risk (07/11/2024)   Epic    Unable to Pay for Housing in the Last Year: No    Number of Times Moved in the Last Year: 0    Homeless in the Last Year: No  Utilities: Not At Risk (07/11/2024)   Epic    Threatened with loss of utilities: No  Health Literacy: Not on file    Past Surgical History:  Procedure Laterality Date   CYSTOSCOPY W/ RETROGRADES Right 10/28/2018   Procedure: CYSTOSCOPY WITH RETROGRADE PYELOGRAM;  Surgeon: Francisca Redell BROCKS, MD;  Location: [facility] ORS;  Service: Urology;  Laterality: Right;   CYSTOSCOPY/URETEROSCOPY/HOLMIUM LASER/STENT PLACEMENT Right 10/28/2018   Procedure: CYSTOSCOPY/URETEROSCOPY/HOLMIUM LASER/STENT PLACEMENT;  Surgeon: Francisca Redell BROCKS, MD;  Location: [facility] ORS;  Service: Urology;  Laterality: Right;   OSTECTOMY METATARSAL Right 2003    Family History  Problem Relation Age of Onset   Arthritis Mother    Breast cancer Mother        breast and bone   Heart Problems Mother    Arthritis Father    Lymphoma Father    Heart disease Father    Stroke Father    Hypertension Father    Clotting disorder Father    Lung cancer Brother    Heart disease Brother        atriall fibrillation   Diabetes Brother    Heart disease Brother        atrial fibrillation   Diabetes Maternal Aunt    Diabetes Maternal Aunt     Allergies[1]  Medications Ordered Prior to Encounter[2]  BP 122/82   Pulse 80   Temp 97.8 F (36.6 C) (Oral)   Ht 6' 0.84 (1.85  m)   Wt 237 lb 6 oz (107.7 kg)   SpO2 97%   BMI 31.46 kg/m  Objective:   Physical Exam Cardiovascular:     Rate and Rhythm: Normal rate and regular rhythm.  Pulmonary:     Effort: Pulmonary effort is normal.  Breath sounds: Normal breath sounds.  Musculoskeletal:     Cervical back: Neck supple.  Skin:    General: Skin is warm and dry.  Neurological:     Mental Status: He is alert and oriented to person, place, and time.  Psychiatric:        Mood and Affect: Mood normal.     Physical Exam        Assessment & Plan:  Type 2 diabetes mellitus with hyperglycemia, without long-term current use of insulin (HCC) Assessment & Plan: Improved with A1C of 7.0 today.   Continue Ozempic  2 mg weekly.  Discussed that his diet is not favorable and to work on improving.  Declines statin therapy. Discussed best practice for statin therapy and diabetes.  Will recheck BMP Declines pneumonia vaccine.  Follow up in 6 months.  Orders: -     POCT glycosylated hemoglobin (Hb A1C) -     Basic metabolic panel with GFR  Anxiety and depression Assessment & Plan: Chronic anxiety.   We discussed options for treatment.  Will start hydroxyzine  10 mg PRN.   He will update.   Orders: -     hydrOXYzine  HCl; Take 1-2 tablets (10-20 mg total) by mouth daily as needed for anxiety.  Dispense: 30 tablet; Refill: 0    Assessment and Plan Assessment & Plan         Comer MARLA Gaskins, NP       [1]  Allergies Allergen Reactions   Penicillins Hives    Childhood allergy  Has patient had a PCN reaction causing immediate rash, facial/tongue/throat swelling, SOB or lightheadedness with hypotension: No Has patient had a PCN reaction causing severe rash involving mucus membranes or skin necrosis: No Has patient had a PCN reaction that required hospitalization: No Has patient had a PCN reaction occurring within the last 10 years: No If all of the above answers are NO, then may  proceed with Cephalosporin use.   [2]  Current Outpatient Medications on File Prior to Visit  Medication Sig Dispense Refill   albuterol  (VENTOLIN  HFA) 108 (90 Base) MCG/ACT inhaler Inhale 1-2 puffs into the lungs every 6 (six) hours as needed. 18 g 0   Blood Glucose Monitoring Suppl DEVI 1 each by Does not apply route in the morning, at noon, and at bedtime. May substitute to any manufacturer covered by patient's insurance. 1 each 0   cyclobenzaprine  (FLEXERIL ) 10 MG tablet Take 1 tablet (10 mg total) by mouth 3 (three) times daily as needed for muscle spasms. 30 tablet 1   docusate sodium  (COLACE) 100 MG capsule Take 1 capsule (100 mg total) by mouth 2 (two) times daily. 10 capsule 0   Glucose Blood (BLOOD GLUCOSE TEST STRIPS) STRP 1 each by In Vitro route in the morning, at noon, and at bedtime. May substitute to any manufacturer covered by patient's insurance. 100 strip 1   Lancet Device MISC 1 each by Does not apply route in the morning, at noon, and at bedtime. May substitute to any manufacturer covered by patient's insurance. 1 each 0   Lancets (ONETOUCH ULTRASOFT) lancets Use as instructed 100 each 12   Lancets Misc. MISC 1 each by Does not apply route in the morning, at noon, and at bedtime. May substitute to any manufacturer covered by patient's insurance. 100 each 2   pregabalin (LYRICA) 75 MG capsule Take 1 capsule twice a day by oral route as directed for 30 days, for lower leg pain.     Semaglutide ,  2 MG/DOSE, (OZEMPIC , 2 MG/DOSE,) 8 MG/3ML SOPN Inject 2 mg into the skin once a week. for diabetes. 9 mL 0   Testosterone  1.62 % GEL Apply 4 Pump topically daily. 300 g 1   No current facility-administered medications on file prior to visit.

## 2024-10-24 NOTE — Assessment & Plan Note (Addendum)
 Improved with A1C of 7.0 today.   Continue Ozempic  2 mg weekly.  Discussed that his diet is not favorable and to work on improving.  Declines statin therapy. Discussed best practice for statin therapy and diabetes.  Will recheck BMP Declines pneumonia vaccine.  Follow up in 6 months.

## 2024-10-25 ENCOUNTER — Ambulatory Visit: Payer: Self-pay | Admitting: Primary Care

## 2024-12-04 ENCOUNTER — Other Ambulatory Visit (HOSPITAL_COMMUNITY): Payer: Self-pay

## 2024-12-04 ENCOUNTER — Telehealth: Payer: Self-pay

## 2024-12-04 NOTE — Telephone Encounter (Signed)
 Pharmacy Patient Advocate Encounter   Received notification from [facility] KEY that prior authorization for Ozempic  2 is required/requested.   Insurance verification completed.   The patient is insured through [employer].   Per test claim: PA required; PA submitted to above mentioned insurance via Latent Key/confirmation #/EOC [id number] Status is pending

## 2024-12-12 NOTE — Telephone Encounter (Signed)
 PA approved

## 2025-04-24 ENCOUNTER — Ambulatory Visit: Admitting: Primary Care
